# Patient Record
Sex: Male | Born: 1980 | Race: White | Hispanic: No | Marital: Single | Smoking: Never smoker
Health system: Southern US, Community
[De-identification: ages and names within clinical notes are randomized; demographics above are authoritative.]

## PROBLEM LIST (undated history)

## (undated) DIAGNOSIS — M51369 Other intervertebral disc degeneration, lumbar region without mention of lumbar back pain or lower extremity pain: Secondary | ICD-10-CM

## (undated) DIAGNOSIS — E119 Type 2 diabetes mellitus without complications: Secondary | ICD-10-CM

## (undated) DIAGNOSIS — K219 Gastro-esophageal reflux disease without esophagitis: Secondary | ICD-10-CM

## (undated) DIAGNOSIS — M109 Gout, unspecified: Secondary | ICD-10-CM

## (undated) DIAGNOSIS — I10 Essential (primary) hypertension: Secondary | ICD-10-CM

## (undated) DIAGNOSIS — G4733 Obstructive sleep apnea (adult) (pediatric): Secondary | ICD-10-CM

## (undated) HISTORY — DX: Obstructive sleep apnea (adult) (pediatric): G47.33

## (undated) HISTORY — DX: Type 2 diabetes mellitus without complications: E11.9

---

## 1999-01-15 HISTORY — PX: TOE AMPUTATION: SHX809

## 1999-05-03 ENCOUNTER — Inpatient Hospital Stay (HOSPITAL_COMMUNITY): Admission: EM | Admit: 1999-05-03 | Discharge: 1999-05-05 | Payer: Self-pay | Admitting: Emergency Medicine

## 1999-05-03 ENCOUNTER — Encounter: Payer: Self-pay | Admitting: Emergency Medicine

## 2000-03-30 ENCOUNTER — Emergency Department (HOSPITAL_COMMUNITY): Admission: EM | Admit: 2000-03-30 | Discharge: 2000-03-30 | Payer: Self-pay | Admitting: *Deleted

## 2002-03-25 ENCOUNTER — Emergency Department (HOSPITAL_COMMUNITY): Admission: EM | Admit: 2002-03-25 | Discharge: 2002-03-26 | Payer: Self-pay | Admitting: Emergency Medicine

## 2002-03-25 ENCOUNTER — Encounter: Payer: Self-pay | Admitting: Emergency Medicine

## 2002-08-06 ENCOUNTER — Emergency Department (HOSPITAL_COMMUNITY): Admission: EM | Admit: 2002-08-06 | Discharge: 2002-08-07 | Payer: Self-pay | Admitting: Emergency Medicine

## 2002-10-19 ENCOUNTER — Emergency Department (HOSPITAL_COMMUNITY): Admission: EM | Admit: 2002-10-19 | Discharge: 2002-10-19 | Payer: Self-pay | Admitting: Emergency Medicine

## 2003-03-10 ENCOUNTER — Emergency Department (HOSPITAL_COMMUNITY): Admission: EM | Admit: 2003-03-10 | Discharge: 2003-03-10 | Payer: Self-pay | Admitting: Family Medicine

## 2003-03-21 ENCOUNTER — Ambulatory Visit (HOSPITAL_BASED_OUTPATIENT_CLINIC_OR_DEPARTMENT_OTHER): Admission: RE | Admit: 2003-03-21 | Discharge: 2003-03-21 | Payer: Self-pay | Admitting: Pulmonary Disease

## 2003-08-08 ENCOUNTER — Emergency Department (HOSPITAL_COMMUNITY): Admission: EM | Admit: 2003-08-08 | Discharge: 2003-08-08 | Payer: Self-pay | Admitting: Emergency Medicine

## 2003-11-06 ENCOUNTER — Emergency Department (HOSPITAL_COMMUNITY): Admission: EM | Admit: 2003-11-06 | Discharge: 2003-11-06 | Payer: Self-pay | Admitting: Family Medicine

## 2004-01-02 ENCOUNTER — Ambulatory Visit: Payer: Self-pay | Admitting: Family Medicine

## 2004-03-29 ENCOUNTER — Ambulatory Visit: Payer: Self-pay | Admitting: Family Medicine

## 2004-04-05 ENCOUNTER — Ambulatory Visit: Payer: Self-pay | Admitting: Family Medicine

## 2004-05-30 ENCOUNTER — Ambulatory Visit: Payer: Self-pay | Admitting: Family Medicine

## 2004-06-12 ENCOUNTER — Ambulatory Visit: Payer: Self-pay | Admitting: Pulmonary Disease

## 2004-12-13 ENCOUNTER — Ambulatory Visit: Payer: Self-pay | Admitting: Family Medicine

## 2004-12-18 ENCOUNTER — Ambulatory Visit: Payer: Self-pay | Admitting: Cardiology

## 2005-01-12 ENCOUNTER — Emergency Department (HOSPITAL_COMMUNITY): Admission: EM | Admit: 2005-01-12 | Discharge: 2005-01-12 | Payer: Self-pay | Admitting: Emergency Medicine

## 2005-02-01 ENCOUNTER — Ambulatory Visit: Payer: Self-pay | Admitting: Family Medicine

## 2005-02-12 ENCOUNTER — Emergency Department (HOSPITAL_COMMUNITY): Admission: EM | Admit: 2005-02-12 | Discharge: 2005-02-12 | Payer: Self-pay | Admitting: Emergency Medicine

## 2005-03-11 ENCOUNTER — Emergency Department (HOSPITAL_COMMUNITY): Admission: EM | Admit: 2005-03-11 | Discharge: 2005-03-11 | Payer: Self-pay | Admitting: Emergency Medicine

## 2005-04-04 ENCOUNTER — Emergency Department (HOSPITAL_COMMUNITY): Admission: EM | Admit: 2005-04-04 | Discharge: 2005-04-04 | Payer: Self-pay | Admitting: Emergency Medicine

## 2005-05-15 ENCOUNTER — Ambulatory Visit: Payer: Self-pay | Admitting: Family Medicine

## 2005-05-17 ENCOUNTER — Emergency Department (HOSPITAL_COMMUNITY): Admission: EM | Admit: 2005-05-17 | Discharge: 2005-05-17 | Payer: Self-pay | Admitting: Emergency Medicine

## 2005-06-04 ENCOUNTER — Ambulatory Visit: Payer: Self-pay | Admitting: Family Medicine

## 2005-06-14 ENCOUNTER — Encounter: Admission: RE | Admit: 2005-06-14 | Discharge: 2005-06-14 | Payer: Self-pay | Admitting: Family Medicine

## 2005-10-03 ENCOUNTER — Emergency Department: Payer: Self-pay | Admitting: Internal Medicine

## 2005-10-16 ENCOUNTER — Emergency Department: Payer: Self-pay | Admitting: Internal Medicine

## 2006-02-27 ENCOUNTER — Ambulatory Visit: Payer: Self-pay | Admitting: Family Medicine

## 2006-02-27 LAB — CONVERTED CEMR LAB
ALT: 56 units/L — ABNORMAL HIGH (ref 0–40)
AST: 40 units/L — ABNORMAL HIGH (ref 0–37)
Albumin: 4.1 g/dL (ref 3.5–5.2)
Alkaline Phosphatase: 75 units/L (ref 39–117)
BUN: 10 mg/dL (ref 6–23)
Basophils Absolute: 0 10*3/uL (ref 0.0–0.1)
Basophils Relative: 0.3 % (ref 0.0–1.0)
Bilirubin, Direct: 0.1 mg/dL (ref 0.0–0.3)
CO2: 26 meq/L (ref 19–32)
Calcium: 9.2 mg/dL (ref 8.4–10.5)
Chloride: 102 meq/L (ref 96–112)
Cholesterol: 122 mg/dL (ref 0–200)
Creatinine, Ser: 0.8 mg/dL (ref 0.4–1.5)
Eosinophils Absolute: 0.1 10*3/uL (ref 0.0–0.6)
Eosinophils Relative: 2 % (ref 0.0–5.0)
GFR calc Af Amer: 150 mL/min
GFR calc non Af Amer: 124 mL/min
Glucose, Bld: 90 mg/dL (ref 70–99)
HCT: 47 % (ref 39.0–52.0)
HDL: 27 mg/dL — ABNORMAL LOW (ref >39.0)
Hemoglobin: 16.3 g/dL (ref 13.0–17.0)
Hgb A1c MFr Bld: 5.2 % (ref 4.6–6.0)
LDL Cholesterol: 69 mg/dL (ref 0–99)
Lymphocytes Relative: 25.4 % (ref 12.0–46.0)
MCHC: 34.7 g/dL (ref 30.0–36.0)
MCV: 84.3 fL (ref 78.0–100.0)
Monocytes Absolute: 0.5 10*3/uL (ref 0.2–0.7)
Monocytes Relative: 7.3 % (ref 3.0–11.0)
Neutro Abs: 5 10*3/uL (ref 1.4–7.7)
Neutrophils Relative %: 65 % (ref 43.0–77.0)
Platelets: 228 10*3/uL (ref 150–400)
Potassium: 3.8 meq/L (ref 3.5–5.1)
RBC: 5.58 M/uL (ref 4.22–5.81)
RDW: 13.7 % (ref 11.5–14.6)
Sodium: 136 meq/L (ref 135–145)
TSH: 3.29 microintl units/mL (ref 0.35–5.50)
Total Bilirubin: 1.1 mg/dL (ref 0.3–1.2)
Total CHOL/HDL Ratio: 4.5
Total Protein: 7.1 g/dL (ref 6.0–8.3)
Triglycerides: 130 mg/dL (ref 0–149)
VLDL: 26 mg/dL (ref 0–40)
WBC: 7.5 10*3/uL (ref 4.5–10.5)

## 2006-03-20 ENCOUNTER — Ambulatory Visit: Payer: Self-pay | Admitting: Family Medicine

## 2006-04-18 ENCOUNTER — Ambulatory Visit: Payer: Self-pay | Admitting: Family Medicine

## 2006-08-27 DIAGNOSIS — K219 Gastro-esophageal reflux disease without esophagitis: Secondary | ICD-10-CM | POA: Insufficient documentation

## 2006-08-27 DIAGNOSIS — J45909 Unspecified asthma, uncomplicated: Secondary | ICD-10-CM | POA: Insufficient documentation

## 2006-08-31 ENCOUNTER — Emergency Department: Payer: Self-pay | Admitting: Emergency Medicine

## 2006-10-02 ENCOUNTER — Telehealth: Payer: Self-pay | Admitting: Family Medicine

## 2006-10-07 ENCOUNTER — Ambulatory Visit: Payer: Self-pay | Admitting: Family Medicine

## 2006-10-07 DIAGNOSIS — I10 Essential (primary) hypertension: Secondary | ICD-10-CM | POA: Insufficient documentation

## 2007-01-15 HISTORY — PX: NOSE SURGERY: SHX723

## 2007-04-20 ENCOUNTER — Ambulatory Visit: Payer: Self-pay | Admitting: Internal Medicine

## 2007-04-24 ENCOUNTER — Emergency Department: Payer: Self-pay | Admitting: Emergency Medicine

## 2007-06-26 ENCOUNTER — Ambulatory Visit: Payer: Self-pay | Admitting: Family Medicine

## 2007-06-29 LAB — CONVERTED CEMR LAB
ALT: 39 units/L (ref 0–53)
AST: 24 units/L (ref 0–37)
Albumin: 4 g/dL (ref 3.5–5.2)
Alkaline Phosphatase: 85 units/L (ref 39–117)
BUN: 13 mg/dL (ref 6–23)
Basophils Absolute: 0 10*3/uL (ref 0.0–0.1)
Basophils Relative: 0.2 % (ref 0.0–1.0)
Bilirubin, Direct: 0.1 mg/dL (ref 0.0–0.3)
CO2: 29 meq/L (ref 19–32)
Calcium: 9 mg/dL (ref 8.4–10.5)
Chloride: 102 meq/L (ref 96–112)
Cholesterol: 120 mg/dL (ref 0–200)
Creatinine, Ser: 0.9 mg/dL (ref 0.4–1.5)
Eosinophils Absolute: 0.1 10*3/uL (ref 0.0–0.7)
Eosinophils Relative: 1.4 % (ref 0.0–5.0)
GFR calc Af Amer: 130 mL/min
GFR calc non Af Amer: 108 mL/min
Glucose, Bld: 98 mg/dL (ref 70–99)
HCT: 41.3 % (ref 39.0–52.0)
HDL: 23.5 mg/dL — ABNORMAL LOW (ref >39.0)
Hemoglobin: 14.2 g/dL (ref 13.0–17.0)
LDL Cholesterol: 81 mg/dL (ref 0–99)
Lymphocytes Relative: 24.5 % (ref 12.0–46.0)
MCHC: 34.3 g/dL (ref 30.0–36.0)
MCV: 84.1 fL (ref 78.0–100.0)
Monocytes Absolute: 0.5 10*3/uL (ref 0.1–1.0)
Monocytes Relative: 8.5 % (ref 3.0–12.0)
Neutro Abs: 3.9 10*3/uL (ref 1.4–7.7)
Neutrophils Relative %: 65.4 % (ref 43.0–77.0)
Platelets: 200 10*3/uL (ref 150–400)
Potassium: 4.7 meq/L (ref 3.5–5.1)
RBC: 4.91 M/uL (ref 4.22–5.81)
RDW: 13.7 % (ref 11.5–14.6)
Sodium: 137 meq/L (ref 135–145)
TSH: 2.45 microintl units/mL (ref 0.35–5.50)
Total Bilirubin: 1 mg/dL (ref 0.3–1.2)
Total CHOL/HDL Ratio: 5.1
Total Protein: 7.4 g/dL (ref 6.0–8.3)
Triglycerides: 79 mg/dL (ref 0–149)
VLDL: 16 mg/dL (ref 0–40)
WBC: 6 10*3/uL (ref 4.5–10.5)

## 2007-07-01 ENCOUNTER — Ambulatory Visit: Payer: Self-pay | Admitting: Family Medicine

## 2007-07-01 DIAGNOSIS — G5600 Carpal tunnel syndrome, unspecified upper limb: Secondary | ICD-10-CM

## 2007-08-03 ENCOUNTER — Telehealth: Payer: Self-pay | Admitting: Family Medicine

## 2007-12-17 ENCOUNTER — Ambulatory Visit: Payer: Self-pay | Admitting: Family Medicine

## 2007-12-17 DIAGNOSIS — G4730 Sleep apnea, unspecified: Secondary | ICD-10-CM

## 2008-01-06 ENCOUNTER — Encounter: Payer: Self-pay | Admitting: Family Medicine

## 2008-02-18 ENCOUNTER — Ambulatory Visit (HOSPITAL_COMMUNITY): Admission: RE | Admit: 2008-02-18 | Discharge: 2008-02-19 | Payer: Self-pay | Admitting: Otolaryngology

## 2008-05-18 ENCOUNTER — Ambulatory Visit: Payer: Self-pay | Admitting: Internal Medicine

## 2008-05-22 ENCOUNTER — Ambulatory Visit (HOSPITAL_BASED_OUTPATIENT_CLINIC_OR_DEPARTMENT_OTHER): Admission: RE | Admit: 2008-05-22 | Discharge: 2008-05-22 | Payer: Self-pay | Admitting: Otolaryngology

## 2009-03-28 ENCOUNTER — Ambulatory Visit: Payer: Self-pay | Admitting: Family Medicine

## 2009-04-08 ENCOUNTER — Emergency Department (HOSPITAL_COMMUNITY): Admission: EM | Admit: 2009-04-08 | Discharge: 2009-04-08 | Payer: Self-pay | Admitting: Family Medicine

## 2010-05-01 LAB — CBC
HCT: 44 % (ref 39.0–52.0)
MCV: 84.3 fL (ref 78.0–100.0)
RBC: 5.22 MIL/uL (ref 4.22–5.81)
WBC: 8 10*3/uL (ref 4.0–10.5)

## 2010-05-01 LAB — BASIC METABOLIC PANEL
Chloride: 104 mEq/L (ref 96–112)
GFR calc Af Amer: >60 mL/min (ref >60)
GFR calc non Af Amer: >60 mL/min (ref >60)
Potassium: 4.3 mEq/L (ref 3.5–5.1)

## 2010-05-29 NOTE — Op Note (Signed)
NAMESEBASTIAN, Danny Bauer               ACCOUNT NO.:  [id number]   MEDICAL RECORD NO.:  [id number]          PATIENT TYPE:  AMB   LOCATION:  SDS                          FACILITY:  [facility]   PHYSICIAN:  Suzanna Obey, M.D.       DATE OF BIRTH:  02-06-1980   DATE OF PROCEDURE:  DATE OF DISCHARGE:                               OPERATIVE REPORT   PREOPERATIVE DIAGNOSES:  Deviated septum and turbinate hypertrophy.   POSTOPERATIVE DIAGNOSES:  Deviated septum and turbinate hypertrophy.   SURGICAL PROCEDURE:  Septoplasty and submucous resection of the inferior  turbinates.   ANESTHESIA:  General.   ESTIMATED BLOOD LOSS:  Approximately 10 mL.   INDICATIONS:  This is a 29 year old who has had chronic nasal  obstruction and congestion and has been refractory to medical therapy.  He has obstructive sleep apnea so this will help his condition, but he  certainly understands this will not in any way cure the condition.  He  was informed of the risks and benefits of the procedure and options were  discussed.  All questions were answered and consent was obtained.   OPERATION:  The patient was taken to the operating room and placed in  supine position.  After general endotracheal tube anesthesia was placed  in the supine position, prepped and draped in the usual sterile manner.  The oxymetazoline pledgets were placed in the septum and inferior  turbinates were injected with 1% lidocaine with 1:100,000 epinephrine.  A left hemitransfixion incision was performed, raising the  mucoperichondrial and osteal flap.  The  caudal edge was divided about 2  cm posterior of the caudal strut and this deviated portion of the septum  was removed posteriorly.  The opposite flap was elevated and the [facility] forceps were used to remove the deviated bone and then a 4-mm  osteotome used to free up the septal spur that was removed.  This  corrected the septal deflection.  Turbinates were infractured.  Midline  incision made with a 15-blade.  Mucosal flap elevated superiorly and the  inferior mucosa and bone were removed with the turbinate scissors.  The  edge was cauterized with suction cautery and both flaps were laid back  down over the lower surface and turbinates were outfractured.  Septum  closed with interrupted 4-0 chromic and 4-0 plain gut quilting stitch  placed through the septum.  This nasopharynx was suctioned out of all  blood and debris, and there was good hemostasis.  Therefore no packing  was placed in either side of the nose.  The patient was awakened and  brought to the recovery room in stable condition.  Counts correct.           ______________________________  Suzanna Obey, M.D.     JB/MEDQ  D:  02/18/2008  T:  02/18/2008  Job:  [id number]

## 2010-06-01 NOTE — Assessment & Plan Note (Signed)
[facility] OFFICE NOTE   Danny Bauer, Danny Bauer                      MRN:          [id number]  DATE:02/27/2006                            DOB:          June 25, 1980    This is a 30 year old gentleman here for a complete physical  examination.  He is doing well and has no complaints.  Unfortunately he  continues to eat whatever he wants and gets almost no exercise.  We have  been following him for acid reflux disease and asthma, both of which  have been very stable.  He continues to use his CPAP machine every night  successfully for sleep apnea.  He has had borderline elevated blood  pressures over the past several years which we have been following  closely.  For further details of his past medical history, family  history, social history, habits, etc., I refer to our last physical note  dated April 05, 2004.   ALLERGIES:  VICODIN, PREDNISONE, AND BEE STINGS.   CURRENT MEDICATIONS:  1. Albuterol inhaler as needed.  2. Protonix 40 mg daily.  3. An EpiPen as needed.  4. Dicyclomine 20 mg t.i.d. as needed (which he has not used in quite      a while).   OBJECTIVE:  Height 6 foot 0 inches, weight 334 pounds (which is up 10  pounds from a year ago), pulse 84 and regular, blood pressure 158/104.  GENERAL:  He remains morbidly obese.  SKIN:  Clear.  EYES:  Clear, he wears glasses.  EARS:  Clear.  PHARYNX:  Clear.  NECK:  Supple without lymphadenopathy or masses.  LUNGS:  Clear with good airflow.  CARDIAC:  Rate and rhythm regular without gallops, murmurs, or rubs.  Distal pulses are full.  ABDOMEN:  Soft, normal bowel sounds, nontender, no masses.  GENITALIA:  Normal male, he is circumcised.  EXTREMITIES:  No clubbing, cyanosis, edema.  NEUROLOGIC:  Grossly intact.   ASSESSMENT/PLAN:  1. Complete physical.  He is fasting so we will get the usual      laboratories today.  We talked about exercising and losing  weight.  2. Asthma.  Stable.  3. Sleep apnea.  Stable.  4. Gastroesophageal reflux disease.  Stable.  5. New diagnosis of hypertension.  In addition to losing weight I gave      him samples to begin Diovan 160 mg to take once daily.  I asked to      see him back in 3 weeks for a followup visit.     Tera Mater. Clent Ridges, MD  Electronically Signed    SAF/MedQ  DD: 02/27/2006  DT: 02/27/2006  Job #: [id number]

## 2010-06-01 NOTE — Op Note (Signed)
[place]. [facility]  Patient:    ESPIRIDION, Danny Bauer                      MRN: [id number] Proc. Date: 05/03/99 Adm. Date:  [id number] Attending:  Aldean Baker V                           Operative Report  PREOPERATIVE DIAGNOSIS: 1. 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 2. Partial amputation of the right second toe.  POSTOPERATIVE DIAGNOSIS: 1. 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 2. Partial amputation of the right second toe.  OPERATION PERFORMED:  Irrigation and debridement of right foot with completion of amputation of right great toe at the interphalangeal joint and closure of traumatic wound of the right second toe.  SURGEON:  Nadara Mustard, M.D.  ANESTHESIA:  Ankle block.  ESTIMATED BLOOD LOSS:  Minimal.  ANTIBIOTICS:  1 gm of Kefzol preoperatively.  DRAINS:  None.  COMPLICATIONS:  None.  TOURNIQUET TIME:  None.  DISPOSITION:  To PACU in stable condition.  INDICATIONS FOR PROCEDURE:  The patient is a 30 year old gentleman status post a lawnmower injury to his right foot.  He received Kefzol and a DT booster shot in the emergency room and was brought emergently to the operating room for definitive treatment.  Risks and benefits were discussed and the patient and his mother state they wish to proceed.  DESCRIPTION OF PROCEDURE:  The patient was brought to operating room 14 after undergoing ankle block.  After adequate level of anesthesia was obtained, the patients right lower extremity was prepped using Betadine paint and scrub and draped into a sterile field.  The amputated sites were irrigated with pulse lavage.  There were no foreign bodies, no particulate matter.  The wounds were extremely clean.  There was a small bony fragment of the distal phalanx of the great toe and this was removed.  The ____________ was intact of the proximal phalanx at the IP joint.  The skin was debrided back to  bleeding healthy edges and the wound was closed over the IP joint of the great toe using far near near far stitch with 3-0 nylon.  The second toe after debridement the wound was closed using interrupted 3-0 nylon.  Both toes were neurovascularly intact.  The wounds were cleansed, covered with Adaptic, orthopedic sponges, sterile Webril and a loosely wrapped Coban.  The patient was then taken to PACU in stable condition.  Plan for 48 hours IV antibiotics. DD:  05/03/99 TD:  05/04/99 Job: [id number]

## 2010-06-01 NOTE — Procedures (Signed)
NAME:  Danny, Bauer NO.:  [id number]   MEDICAL RECORD NO.:  [id number]          PATIENT TYPE:  OUT   LOCATION:  SLEEP CENTER                 FACILITY:  [facility] [place]   PHYSICIAN:  Clinton D. Maple Hudson, MD, FCCP, FACPDATE OF BIRTH:  1979-08-14   DATE OF STUDY:                            NOCTURNAL POLYSOMNOGRAM   REFERRING PHYSICIAN:  Suzanna Obey, M.D.   INDICATION FOR STUDY:  Hypersomnia with sleep apnea.   EPWORTH SLEEPINESS SCORE:  2/24.  BMI 42.2.  Weight 329 pounds.  Height  74 inches.  Neck 21 inches.   MEDICATIONS:  Home medication charted and reviewed.   SLEEP ARCHITECTURE:  Split study protocol.  During the diagnostic phase  total sleep time 131 minutes with sleep efficiency 96.3%.  Stage I was  0.4%.  Stage II 99.6%.  Stage III and REM were absent.  Sleep latency 5  minutes.  Awake after sleep onset 0.  Arousal index 54.8 indicating  increased EEG arousal.  No bedtime medication was taken.   RESPIRATORY DATA:  Split study protocol.  Apnea/hypopnea index (AHI)  107.2 per hour.  A total of 235 events was scored including 125  obstructive apneas and 110 hypopneas.  Events were common while supine  and nonsupine.  CPAP was then titrated to 12 CWP, AHI 1.3 per hour.  He  wore a medium ResMed Mirage Quattro mask with heated humidifier.   OXYGEN DATA:  Loud snoring before CPAP with oxygen desaturation to a  nadir of 77%.  After CPAP control, mean oxygen saturation held 96.6% on  room air.   CARDIAC DATA:  Sinus rhythm with occasional PVCs.   MOVEMENT-PARASOMNIA:  No significant movement disturbance.  No bathroom  trips.   IMPRESSIONS-RECOMMENDATIONS:  1. Very severe obstructive sleep apnea/hypopnea syndrome, AHI 107.2      per hour with loud snoring and oxygen desaturation to a nadir of      77% on room air.  2. Successful CPAP titration to 12 CWP, AHI 1.3 per hour.  He wore a      medium ResMed Mirage Quattro mask with heated humidifier.      Clinton D.  Maple Hudson, MD, [facility], FACP  Diplomate, Biomedical engineer of Sleep Medicine  Electronically Signed     CDY/MEDQ  D:  05/23/2008 19:08:42  T:  05/24/2008 08:00:11  Job:  [id number]

## 2010-06-01 NOTE — H&P (Signed)
[place] [facility]. [facility]  Patient:    Danny Bauer, Danny Bauer                      MRN: [id number] Adm. Date:  [id number] Attending:  Nadara Mustard                         History and Physical  HISTORY OF PRESENT ILLNESS:  The patient is a 30 year old gentleman who was mowing the lawn this evening and had the lawnmower run over his right foot.  Injury happened approximately 5:15 p.m.  He was seen in the emergency room at 6 p.m. is toe and sneaker were left at home.  ALLERGIES:  No known drug allergies.  MEDICATIONS:  None.  PAST MEDICAL HISTORY:  Negative.  FAMILY HISTORY:  Negative.  REVIEW OF SYSTEMS:  Negative.  PHYSICAL EXAMINATION:  GENERAL:  He is in no acute distress.  He is an extremely obese 30 year old gentleman.  NECK:  Supple.  No bruits.  LUNGS:  Clear to auscultation.  CARDIOVASCULAR:  Regular rate and rhythm.  RIGHT LOWER EXTREMITY:  He has a good dorsalis pedis pulse.  He has an amputation through the IP joint of the right great toe with a partial amputation of the second toe.  Radiographs confirm the diagnosis.  There is no bony involvement of the right second toe.  ASSESSMENT: 1. Amputation of right great toe through the interphalangeal joint. 2. Partial amputation of the right second toe.  PLAN:  He was scheduled for surgical intervention with completion of the amputation, irrigation and debridement and revision of the amputations.  Risks nd benefits were discussed with the patient and his mother.  They both state they understand and wish to proceed at this time. Risk include infection, neurovascular injury, persistent pain, nonhealing of the wound, need for further amputation. The patient and his mother wished to proceed at this time. DD:  05/03/99 TD:  05/03/99 Job: [id number]

## 2010-09-23 ENCOUNTER — Emergency Department (HOSPITAL_COMMUNITY): Payer: Self-pay

## 2010-09-23 ENCOUNTER — Emergency Department (HOSPITAL_COMMUNITY)
Admission: EM | Admit: 2010-09-23 | Discharge: 2010-09-23 | Disposition: A | Discharge status: Home / Self Care | Payer: Self-pay | Attending: Emergency Medicine | Admitting: Emergency Medicine

## 2010-09-23 DIAGNOSIS — R0789 Other chest pain: Secondary | ICD-10-CM | POA: Insufficient documentation

## 2010-09-23 DIAGNOSIS — K219 Gastro-esophageal reflux disease without esophagitis: Secondary | ICD-10-CM | POA: Insufficient documentation

## 2010-09-23 DIAGNOSIS — K589 Irritable bowel syndrome without diarrhea: Secondary | ICD-10-CM | POA: Insufficient documentation

## 2010-09-23 DIAGNOSIS — J45909 Unspecified asthma, uncomplicated: Secondary | ICD-10-CM | POA: Insufficient documentation

## 2010-09-23 DIAGNOSIS — I10 Essential (primary) hypertension: Secondary | ICD-10-CM | POA: Insufficient documentation

## 2010-09-23 LAB — COMPREHENSIVE METABOLIC PANEL
ALT: 48 U/L (ref 0–53)
AST: 30 U/L (ref 0–37)
CO2: 26 mEq/L (ref 19–32)
Calcium: 8.8 mg/dL (ref 8.4–10.5)
Chloride: 103 mEq/L (ref 96–112)
GFR calc Af Amer: >60 mL/min (ref >60)
GFR calc non Af Amer: >60 mL/min (ref >60)
Glucose, Bld: 105 mg/dL — ABNORMAL HIGH (ref 70–99)
Sodium: 137 mEq/L (ref 135–145)
Total Bilirubin: 0.4 mg/dL (ref 0.3–1.2)

## 2010-09-23 LAB — CBC
Hemoglobin: 14.6 g/dL (ref 13.0–17.0)
MCH: 29.3 pg (ref 26.0–34.0)
MCV: 82.6 fL (ref 78.0–100.0)
Platelets: 209 10*3/uL (ref 150–400)
RBC: 4.99 MIL/uL (ref 4.22–5.81)
WBC: 7 10*3/uL (ref 4.0–10.5)

## 2010-09-23 LAB — POCT I-STAT TROPONIN I: Troponin i, poc: 0.01 ng/mL (ref 0.00–0.08)

## 2010-11-29 ENCOUNTER — Emergency Department (INDEPENDENT_AMBULATORY_CARE_PROVIDER_SITE_OTHER)
Admission: EM | Admit: 2010-11-29 | Discharge: 2010-11-29 | Disposition: A | Discharge status: Home / Self Care | Payer: Self-pay | Source: Home / Self Care | Attending: Family Medicine | Admitting: Family Medicine

## 2010-11-29 ENCOUNTER — Encounter: Payer: Self-pay | Admitting: Cardiology

## 2010-11-29 DIAGNOSIS — R05 Cough: Secondary | ICD-10-CM

## 2010-11-29 HISTORY — DX: Essential (primary) hypertension: I10

## 2010-11-29 HISTORY — DX: Gastro-esophageal reflux disease without esophagitis: K21.9

## 2010-11-29 MED ORDER — GUAIFENESIN-CODEINE 100-10 MG/5ML PO SYRP: 5.0000 mL | ORAL_SOLUTION | Freq: Three times a day (TID) | ORAL | Status: AC | PRN

## 2010-11-29 MED ORDER — PREDNISONE (PAK) 10 MG PO TABS: ORAL_TABLET | ORAL | Status: AC

## 2010-11-29 MED ORDER — BENZONATATE 200 MG PO CAPS: 200.0000 mg | ORAL_CAPSULE | Freq: Three times a day (TID) | ORAL | Status: AC | PRN

## 2010-11-29 NOTE — ED Provider Notes (Signed)
History     CSN: [id number] Arrival date & time: 11/29/2010  8:25 AM   First MD Initiated Contact with Patient 11/29/10 [phone]      Chief Complaint  Patient presents with  . Cough    (Consider location/radiation/quality/duration/timing/severity/associated sxs/prior treatment) Patient is a 30 y.o. male presenting with cough. The history is provided by the patient.  Cough This is a new problem. The current episode started more than 2 days ago. The problem occurs constantly. The problem has been gradually worsening. The cough is non-productive. There has been no fever. Associated symptoms include sore throat. Pertinent negatives include no chills, no ear pain, no rhinorrhea, no myalgias and no shortness of breath. He has tried cough syrup for the symptoms. The treatment provided no relief.    Past Medical History  Diagnosis Date  . Hypertension   . GERD (gastroesophageal reflux disease)     Past Surgical History  Procedure Date  . 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 Nose surgery 2009    straighten nasal passage     Family History  Problem Relation Age of Onset  . Heart failure Mother   . Heart disease Mother   . Heart attack Father   . Heart disease Father     History  Substance Use Topics  . Smoking status: Never Smoker   . Smokeless tobacco: Not on file  . Alcohol Use: No      Review of Systems  Constitutional: Positive for fatigue. Negative for chills.  HENT: Positive for sore throat. Negative for ear pain and rhinorrhea.   Eyes: Negative.   Respiratory: Positive for cough. Negative for shortness of breath.   Gastrointestinal: Negative.   Genitourinary: Negative.   Musculoskeletal: Negative for myalgias.  Neurological: Negative.     Allergies  Beeswax; Codeine; Hydrocodone-acetaminophen; and Prednisone  Home Medications  No current outpatient prescriptions on file.  BP 143/88  Pulse 86  Temp(Src) 99.2 F (37.3 C) (Oral)  Resp 22  SpO2  95%  Physical Exam  Constitutional: He is oriented to person, place, and time. He appears well-developed and well-nourished.  HENT:  Head: Normocephalic and atraumatic.  Right Ear: Tympanic membrane normal.  Left Ear: Tympanic membrane normal.  Mouth/Throat: Oropharynx is clear and moist and mucous membranes are normal.  Eyes: EOM are normal.  Neck: Normal range of motion.  Pulmonary/Chest: Effort normal and breath sounds normal. He has no wheezes. He has no rhonchi. He has no rales.  Neurological: He is alert and oriented to person, place, and time.  Skin: Skin is warm and dry.    ED Course  Procedures (including critical care time)  Labs Reviewed - No data to display No results found.   No diagnosis found.    MDM          Richardo Priest, MD 11/29/10 [phone]

## 2010-11-29 NOTE — ED Notes (Signed)
Pt reports non-productive cough and chest congestion for the past 3 days. Also notes dry, itchy throat.  Taking tussin OTC with no relief. BS CTA bilat.

## 2011-01-18 ENCOUNTER — Ambulatory Visit (INDEPENDENT_AMBULATORY_CARE_PROVIDER_SITE_OTHER): Payer: Self-pay | Admitting: Family Medicine

## 2011-01-18 ENCOUNTER — Encounter: Payer: Self-pay | Admitting: Family Medicine

## 2011-01-18 VITALS — BP 130/92 | HR 95 | Temp 98.7°F | Wt 370.0 lb

## 2011-01-18 DIAGNOSIS — J40 Bronchitis, not specified as acute or chronic: Secondary | ICD-10-CM

## 2011-01-18 DIAGNOSIS — I10 Essential (primary) hypertension: Secondary | ICD-10-CM

## 2011-01-18 MED ORDER — AZITHROMYCIN 250 MG PO TABS: ORAL_TABLET | ORAL | Status: AC

## 2011-01-18 MED ORDER — LISINOPRIL 20 MG PO TABS: 20.0000 mg | ORAL_TABLET | Freq: Every day | ORAL | Status: DC

## 2011-01-18 MED ORDER — DILTIAZEM HCL 90 MG PO TABS: 90.0000 mg | ORAL_TABLET | Freq: Four times a day (QID) | ORAL | Status: DC

## 2011-01-18 NOTE — Progress Notes (Signed)
  Subjective:    Patient ID: Danny Bauer, male    DOB: 10-Mar-1980, 31 y.o.   MRN: [id number]  HPI Here for elevated BP and coughing for 5 days. The cough is dry but he feels tight and congested. No fever. I have not seen him in 3 years. He ran out of BP meds over a year ago.    Review of Systems  Constitutional: Negative.   HENT: Negative.   Eyes: Negative.   Respiratory: Positive for cough and chest tightness.   Cardiovascular: Negative.        Objective:   Physical Exam  Constitutional: He appears well-developed and well-nourished.  HENT:  Right Ear: External ear normal.  Left Ear: External ear normal.  Nose: Nose normal.  Mouth/Throat: Oropharynx is clear and moist. No oropharyngeal exudate.  Eyes: Conjunctivae are normal.  Cardiovascular: Normal rate, regular rhythm, normal heart sounds and intact distal pulses.   Pulmonary/Chest: Breath sounds normal. No respiratory distress. He has no wheezes. He has no rales.  Lymphadenopathy:    He has no cervical adenopathy.          Assessment & Plan:  Treat the bronchitis. Add Mucinex prn. Get back on his HTN meds. Recheck in one month

## 2011-02-18 ENCOUNTER — Encounter: Payer: Self-pay | Admitting: Family Medicine

## 2011-02-18 ENCOUNTER — Ambulatory Visit (INDEPENDENT_AMBULATORY_CARE_PROVIDER_SITE_OTHER): Payer: Self-pay | Admitting: Family Medicine

## 2011-02-18 VITALS — BP 128/80 | HR 79 | Temp 97.9°F

## 2011-02-18 DIAGNOSIS — I10 Essential (primary) hypertension: Secondary | ICD-10-CM

## 2011-02-18 MED ORDER — DILTIAZEM HCL 90 MG PO TABS: 90.0000 mg | ORAL_TABLET | Freq: Every day | ORAL | Status: DC

## 2011-02-18 NOTE — Progress Notes (Signed)
  Subjective:    Patient ID: Danny Bauer, male    DOB: 12/23/1980, 31 y.o.   MRN: [id number]  HPI Here to follow up on HTN after taking his meds for one month. He feels great and his BP is well controlled.    Review of Systems  Constitutional: Negative.   Respiratory: Negative.   Cardiovascular: Negative.        Objective:   Physical Exam  Constitutional: He appears well-developed and well-nourished.  Cardiovascular: Normal rate, regular rhythm, normal heart sounds and intact distal pulses.   Pulmonary/Chest: Effort normal and breath sounds normal. No respiratory distress. He has no wheezes. He has no rales. He exhibits no tenderness.          Assessment & Plan:  Recheck in 6 months

## 2011-04-09 ENCOUNTER — Ambulatory Visit (INDEPENDENT_AMBULATORY_CARE_PROVIDER_SITE_OTHER): Payer: Self-pay | Admitting: Family Medicine

## 2011-04-09 ENCOUNTER — Encounter: Payer: Self-pay | Admitting: Family Medicine

## 2011-04-09 VITALS — BP 124/88 | HR 120 | Temp 100.8°F

## 2011-04-09 DIAGNOSIS — J329 Chronic sinusitis, unspecified: Secondary | ICD-10-CM

## 2011-04-09 MED ORDER — DOXYCYCLINE HYCLATE 100 MG PO CAPS: 100.0000 mg | ORAL_CAPSULE | Freq: Two times a day (BID) | ORAL | Status: AC

## 2011-04-09 MED ORDER — EPINEPHRINE 0.3 MG/0.3ML IJ DEVI: 0.3000 mg | Freq: Once | INTRAMUSCULAR | Status: DC

## 2011-04-09 MED ORDER — PROMETHAZINE HCL 25 MG PO TABS: 25.0000 mg | ORAL_TABLET | Freq: Four times a day (QID) | ORAL | Status: AC | PRN

## 2011-04-09 NOTE — Progress Notes (Signed)
  Subjective:    Patient ID: Danny Bauer, male    DOB: [birth year], 31 y.o.   MRN: [id number]  HPI Here for 10 days of sinus pressure, PND, HA, and a dry cough. No fever.    Review of Systems  Constitutional: Negative.   HENT: Positive for congestion, postnasal drip and sinus pressure.   Eyes: Negative.   Respiratory: Positive for cough.        Objective:   Physical Exam  Constitutional: He appears well-developed and well-nourished.  HENT:  Right Ear: External ear normal.  Left Ear: External ear normal.  Nose: Nose normal.  Mouth/Throat: Oropharynx is clear and moist. No oropharyngeal exudate.  Eyes: Conjunctivae are normal.  Pulmonary/Chest: Effort normal and breath sounds normal.  Lymphadenopathy:    He has no cervical adenopathy.          Assessment & Plan:  Add Mucinex

## 2011-04-18 ENCOUNTER — Emergency Department (HOSPITAL_COMMUNITY)
Admission: EM | Admit: 2011-04-18 | Discharge: 2011-04-18 | Disposition: A | Discharge status: Home / Self Care | Payer: Self-pay | Attending: Emergency Medicine | Admitting: Emergency Medicine

## 2011-04-18 ENCOUNTER — Emergency Department (HOSPITAL_COMMUNITY): Payer: Self-pay

## 2011-04-18 DIAGNOSIS — M7751 Other enthesopathy of right foot: Secondary | ICD-10-CM

## 2011-04-18 DIAGNOSIS — M79609 Pain in unspecified limb: Secondary | ICD-10-CM | POA: Insufficient documentation

## 2011-04-18 DIAGNOSIS — K219 Gastro-esophageal reflux disease without esophagitis: Secondary | ICD-10-CM | POA: Insufficient documentation

## 2011-04-18 DIAGNOSIS — I10 Essential (primary) hypertension: Secondary | ICD-10-CM | POA: Insufficient documentation

## 2011-04-18 MED ORDER — OXYCODONE-ACETAMINOPHEN 5-325 MG PO TABS: 1.0000 | ORAL_TABLET | ORAL | Status: AC | PRN

## 2011-04-18 MED ORDER — NAPROXEN 500 MG PO TABS: 500.0000 mg | ORAL_TABLET | Freq: Two times a day (BID) | ORAL | Status: DC

## 2011-04-18 NOTE — ED Notes (Signed)
Pt reports right foot pain that started 2-3 days ago after working. Pt denies fall or injury. Pt says it hurts to walk but able to ambulate.

## 2011-04-18 NOTE — Discharge Instructions (Signed)
Your x-ray was normal today. Keep ACE wrap on, keep it elevated above your heart level. Ice several times a day for 20 min at a time. Naprosyn for pain and inflammation. Percocet for severe pain. Follow up with orthopedics if not improving.   Tendinitis Tendinitis is swelling and inflammation of the tendons. Tendons are band-like tissues that connect muscle to bone. Tendinitis commonly occurs in the:   Shoulders (rotator cuff).   Heels (Achilles tendon).   Elbows (triceps tendon).  CAUSES Tendinitis is usually caused by overusing the tendon, muscles, and joints involved. When the tissue surrounding a tendon (synovium) becomes inflamed, it is called tenosynovitis. Tendinitis commonly develops in people whose jobs require repetitive motions. SYMPTOMS  Pain.   Tenderness.   Mild swelling.  DIAGNOSIS Tendinitis is usually diagnosed by physical exam. Your caregiver may also order X-rays or other imaging tests. TREATMENT Your caregiver may recommend certain medicines or exercises for your treatment. HOME CARE INSTRUCTIONS   Use a sling or splint for as long as directed by your caregiver until the pain decreases.   Put ice on the injured area.   Put ice in a plastic bag.   Place a towel between your skin and the bag.   Leave the ice on for 15 to 20 minutes, 3 to 4 times a day.   Avoid using the limb while the tendon is painful. Perform gentle range of motion exercises only as directed by your caregiver. Stop exercises if pain or discomfort increase, unless directed otherwise by your caregiver.   Only take over-the-counter or prescription medicines for pain, discomfort, or fever as directed by your caregiver.  SEEK MEDICAL CARE IF:   Your pain and swelling increase.   You develop new, unexplained symptoms, especially increased numbness in the hands.  MAKE SURE YOU:   Understand these instructions.   Will watch your condition.   Will get help right away if you are not doing  well or get worse.  Document Released: 12/29/1999 Document Revised: 12/20/2010 Document Reviewed: 03/19/2010 [facility] Patient Information 2012 [place], [place].

## 2011-04-18 NOTE — ED Provider Notes (Signed)
History     CSN: [id number]  Arrival date & time 04/18/11  1512   First MD Initiated Contact with Patient 04/18/11 1620      Chief Complaint  Patient presents with  . Foot Pain    (Consider location/radiation/quality/duration/timing/severity/associated sxs/prior treatment) Patient is a 31 y.o. male presenting with lower extremity pain. The history is provided by the patient.  Foot Pain This is a new problem. The current episode started in the past 7 days. Associated symptoms include joint swelling.  PT states his right foot has been hurting and now swollen. Pain for about 3 days. No known injury. Pain with walking and being on his feet for prolonged time. Did not take any medications. No fever, chills, malaise. No other complaints.   Past Medical History  Diagnosis Date  . Hypertension   . GERD (gastroesophageal reflux disease)     Past Surgical History  Procedure Date  . 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 Nose surgery 2009    straighten nasal passage     Family History  Problem Relation Age of Onset  . Heart failure Mother   . Heart disease Mother   . Heart attack Father   . Heart disease Father     History  Substance Use Topics  . Smoking status: Never Smoker   . Smokeless tobacco: Never Used  . Alcohol Use: No      Review of Systems  Musculoskeletal: Positive for joint swelling.  All other systems reviewed and are negative.    Allergies  Beeswax; Codeine; Hydrocodone-acetaminophen; and Prednisone  Home Medications   Current Outpatient Rx  Name Route Sig Dispense Refill  . DILTIAZEM HCL 90 MG PO TABS Oral Take 1 tablet (90 mg total) by mouth daily. 30 tablet 11  . DOXYCYCLINE HYCLATE 100 MG PO CAPS Oral Take 1 capsule (100 mg total) by mouth 2 (two) times daily. 20 capsule 0  . EPINEPHRINE 0.3 MG/0.3ML IJ DEVI Intramuscular Inject 0.3 mLs (0.3 mg total) into the muscle once. 2 Device 5  . LISINOPRIL 20 MG PO TABS Oral Take 1 tablet (20 mg  total) by mouth daily. 30 tablet 11    BP 142/79  Pulse 100  Temp(Src) 99.4 F (37.4 C) (Oral)  Resp 22  SpO2 100%  Physical Exam  Nursing note and vitals reviewed. Constitutional: He is oriented to person, place, and time. He appears well-developed and well-nourished. No distress.       obese  HENT:  Head: Normocephalic and atraumatic.  Eyes: Conjunctivae are normal.  Neck: Neck supple.  Cardiovascular: Normal rate, regular rhythm and normal heart sounds.   Pulmonary/Chest: Effort normal and breath sounds normal. No respiratory distress. He has no wheezes.  Musculoskeletal: He exhibits edema and tenderness.       Pt has swelling over medial malleolus. Tender to palpation over malleolus and just posterior to the bone over Flexor tendons. Pain with dorsiflexion, and inversion. No erythema or warmth to palpation  Neurological: He is alert and oriented to person, place, and time.  Skin: Skin is warm and dry.  Psychiatric: He has a normal mood and affect.    ED Course  Procedures (including critical care time)  Labs Reviewed - No data to display Dg Ankle Complete Right  04/18/2011  *RADIOLOGY REPORT*  Clinical Data: Foot pain.  Right ankle pain.  RIGHT ANKLE - COMPLETE 3+ VIEW  Comparison: None  Findings: There is diffuse soft tissue swelling identified.  There  is no acute fracture or subluxation identified.  Posterior calcaneal and plantar heel spurs noted.  No radiopaque foreign bodies or soft tissue calcifications.  IMPRESSION:  1.  Diffuse soft tissue swelling. 2.  No fracture or subluxation.  Original Report Authenticated By: Rosealee Albee, M.D.   X-ray negative. No known injury. Pt is morbidly obese, suspect tendonitis vs sprain of his flexor tendons over medial ankle. Will d/c home with ACE wrap, pain medications. Pt is ambulatory. No signs of infection. Follow up with orthopedics.  No diagnosis found.    MDM          Lottie Mussel, PA 04/18/11 1743

## 2011-04-26 NOTE — ED Provider Notes (Signed)
Medical screening examination/treatment/procedure(s) were performed by non-physician practitioner and as supervising physician I was immediately available for consultation/collaboration.    Abdulahad Mederos L Aamari Strawderman, MD 04/26/11 1632 

## 2011-08-19 ENCOUNTER — Encounter: Payer: Self-pay | Admitting: Family Medicine

## 2011-08-19 ENCOUNTER — Ambulatory Visit (INDEPENDENT_AMBULATORY_CARE_PROVIDER_SITE_OTHER): Payer: Self-pay | Admitting: Family Medicine

## 2011-08-19 VITALS — BP 138/82 | HR 96 | Temp 98.9°F | Wt 384.0 lb

## 2011-08-19 DIAGNOSIS — I10 Essential (primary) hypertension: Secondary | ICD-10-CM

## 2011-08-19 DIAGNOSIS — J45909 Unspecified asthma, uncomplicated: Secondary | ICD-10-CM

## 2011-08-19 NOTE — Progress Notes (Signed)
  Subjective:    Patient ID: Danny Bauer, male    DOB: 1980/02/09, 31 y.o.   MRN: [id number]  HPI Here to follow up. His asthma has been well controlled this summer. His BP is stable.    Review of Systems  Constitutional: Negative.   Respiratory: Negative.   Cardiovascular: Negative.        Objective:   Physical Exam  Constitutional: He appears well-developed and well-nourished.  Neck: No thyromegaly present.  Cardiovascular: Normal rate, regular rhythm, normal heart sounds and intact distal pulses.   Pulmonary/Chest: Effort normal and breath sounds normal.  Lymphadenopathy:    He has no cervical adenopathy.          Assessment & Plan:  Stable HTN and asthma. He will set up fasting labs soon

## 2011-08-21 ENCOUNTER — Other Ambulatory Visit (INDEPENDENT_AMBULATORY_CARE_PROVIDER_SITE_OTHER): Payer: Self-pay

## 2011-08-21 DIAGNOSIS — I10 Essential (primary) hypertension: Secondary | ICD-10-CM

## 2011-08-21 LAB — CBC WITH DIFFERENTIAL/PLATELET
Eosinophils Absolute: 0.1 10*3/uL (ref 0.0–0.7)
Eosinophils Relative: 1.7 % (ref 0.0–5.0)
HCT: 42.7 % (ref 39.0–52.0)
Lymphs Abs: 1.3 10*3/uL (ref 0.7–4.0)
MCHC: 33.6 g/dL (ref 30.0–36.0)
MCV: 83.6 fl (ref 78.0–100.0)
Monocytes Absolute: 0.5 10*3/uL (ref 0.1–1.0)
Neutrophils Relative %: 73.4 % (ref 43.0–77.0)
Platelets: 198 10*3/uL (ref 150.0–400.0)
WBC: 7.1 10*3/uL (ref 4.5–10.5)

## 2011-08-21 LAB — BASIC METABOLIC PANEL
CO2: 27 mEq/L (ref 19–32)
Chloride: 99 mEq/L (ref 96–112)
Potassium: 4 mEq/L (ref 3.5–5.1)
Sodium: 134 mEq/L — ABNORMAL LOW (ref 135–145)

## 2011-08-21 LAB — HEPATIC FUNCTION PANEL
ALT: 45 U/L (ref 0–53)
AST: 44 U/L — ABNORMAL HIGH (ref 0–37)
Alkaline Phosphatase: 71 U/L (ref 39–117)
Bilirubin, Direct: 0.1 mg/dL (ref 0.0–0.3)
Total Bilirubin: 1.1 mg/dL (ref 0.3–1.2)
Total Protein: 7.5 g/dL (ref 6.0–8.3)

## 2011-08-21 LAB — LIPID PANEL
LDL Cholesterol: 73 mg/dL (ref 0–99)
Total CHOL/HDL Ratio: 4
Triglycerides: 153 mg/dL — ABNORMAL HIGH (ref 0.0–149.0)

## 2011-08-22 NOTE — Progress Notes (Signed)
Quick Note:  Spoke with pt- informed labs normal ______ 

## 2011-11-07 ENCOUNTER — Encounter (HOSPITAL_COMMUNITY): Payer: Self-pay | Admitting: *Deleted

## 2011-11-07 ENCOUNTER — Emergency Department (HOSPITAL_COMMUNITY)
Admission: EM | Admit: 2011-11-07 | Discharge: 2011-11-07 | Disposition: A | Discharge status: Home / Self Care | Payer: Self-pay | Attending: Emergency Medicine | Admitting: Emergency Medicine

## 2011-11-07 ENCOUNTER — Emergency Department (HOSPITAL_COMMUNITY): Payer: Self-pay

## 2011-11-07 DIAGNOSIS — K219 Gastro-esophageal reflux disease without esophagitis: Secondary | ICD-10-CM | POA: Insufficient documentation

## 2011-11-07 DIAGNOSIS — M25579 Pain in unspecified ankle and joints of unspecified foot: Secondary | ICD-10-CM | POA: Insufficient documentation

## 2011-11-07 DIAGNOSIS — M25571 Pain in right ankle and joints of right foot: Secondary | ICD-10-CM

## 2011-11-07 DIAGNOSIS — I10 Essential (primary) hypertension: Secondary | ICD-10-CM | POA: Insufficient documentation

## 2011-11-07 MED ORDER — NAPROXEN 500 MG PO TABS: 500.0000 mg | ORAL_TABLET | Freq: Two times a day (BID) | ORAL | Status: DC

## 2011-11-07 NOTE — ED Notes (Signed)
Pt reports right anterior foot pain at base of tibia. States that pain started this past Tuesday. States he does not recall injuring it. Pt has good pedal pulse and capillary refill on right foot. Pt reports not taking any pain meds for the foot pain.

## 2011-11-07 NOTE — ED Provider Notes (Signed)
History     CSN: [id number]  Arrival date & time 11/07/11  2103   First MD Initiated Contact with Patient 11/07/11 2121      Chief Complaint  Patient presents with  . Ankle Pain    (Consider location/radiation/quality/duration/timing/severity/associated sxs/prior treatment) Patient is a 31 y.o. male presenting with ankle pain. The history is provided by the patient.  Ankle Pain  The incident occurred more than 2 days ago. The incident occurred at work. There was no injury mechanism. The pain is present in the right ankle. The quality of the pain is described as aching. The pain is at a severity of 5/10. The pain has been constant since onset. Pertinent negatives include no numbness, no inability to bear weight, no muscle weakness, no loss of sensation and no tingling. The symptoms are aggravated by bearing weight and activity. He has tried nothing for the symptoms.  Pt states right ankle swelling and pain for 2 days now. No injuries, however states very active at work, climbing letters, states there is a possibility for injury. States no calf pain or swelling. No foot swelling. States did not take any medications. Has been working on it which is making it worse. No redness or warmth to the touch.   Past Medical History  Diagnosis Date  . Hypertension   . GERD (gastroesophageal reflux disease)     Past Surgical History  Procedure Date  . 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 Nose surgery 2009    straighten nasal passage     Family History  Problem Relation Age of Onset  . Heart failure Mother   . Heart disease Mother   . Heart attack Father   . Heart disease Father     History  Substance Use Topics  . Smoking status: Never Smoker   . Smokeless tobacco: Never Used  . Alcohol Use: No      Review of Systems  Constitutional: Negative for fever and chills.  Respiratory: Negative.   Cardiovascular: Negative.   Musculoskeletal: Positive for joint swelling.  Skin:  Negative for color change and wound.  Neurological: Negative for tingling, weakness and numbness.    Allergies  Beeswax; Codeine; Hydrocodone-acetaminophen; and Prednisone  Home Medications   Current Outpatient Rx  Name Route Sig Dispense Refill  . DILTIAZEM HCL 90 MG PO TABS Oral Take 1 tablet (90 mg total) by mouth daily. 30 tablet 11  . EPINEPHRINE 0.3 MG/0.3ML IJ DEVI Intramuscular Inject 0.3 mLs (0.3 mg total) into the muscle once. 2 Device 5  . LISINOPRIL 20 MG PO TABS Oral Take 1 tablet (20 mg total) by mouth daily. 30 tablet 11  . OMEPRAZOLE 20 MG PO CPDR Oral Take 20 mg by mouth daily.      BP 149/87  Pulse 101  Temp 98.1 F (36.7 C) (Oral)  Resp 22  Ht 6\' 2"  (1.88 m)  Wt 325 lb (147.419 kg)  BMI 41.73 kg/m2  SpO2 98%  Physical Exam  Nursing note and vitals reviewed. Constitutional: He appears well-developed and well-nourished. No distress.  Cardiovascular: Normal rate, regular rhythm and normal heart sounds.   Pulmonary/Chest: Effort normal and breath sounds normal. No respiratory distress. He has no wheezes. He has no rales.  Musculoskeletal:       Right ankle swelling noted. Tender to palpation over dorsal ankle. Normal foot, other than partial amputation of great toe. Normal skin color, no bruising, no erythema. Pain with ROM of ankle in all  directions.   Neurological: He is alert.  Skin: Skin is warm and dry.  Psychiatric: He has a normal mood and affect.    ED Course  Procedures (including critical care time)  Labs Reviewed - No data to display Dg Foot Complete Right  11/07/2011  *RADIOLOGY REPORT*  Clinical Data: Right foot pain.  RIGHT FOOT COMPLETE - 3+ VIEW  Comparison: None.  Findings: The patient is status post amputation of the distal phalanx in the great toe.  Mild diffuse edematous changes are present throughout the foot.  Degenerative changes noted at the second and third MTP joints.  There also degenerative changes in the DIP joints.  No acute  osseous abnormality is evident.  IMPRESSION:  1.  Mild edematous changes in the foot. 2.  No acute osseous abnormality. 3.  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 4.  Degenerative changes.   Original Report Authenticated By: Jamesetta Orleans. MATTERN, M.D.    Negative right ankle x-ray. No signs of infection. Doubt gout. Calf soft, non tender, doubt dvt. Will start on NSADs, ACE, follow up with orthopedics as needed.   1. Right ankle pain       MDM          Lottie Mussel, PA 11/08/11 [phone]

## 2011-11-12 NOTE — ED Provider Notes (Signed)
Medical screening examination/treatment/procedure(s) were performed by non-physician practitioner and as supervising physician I was immediately available for consultation/collaboration.  Donnetta Hutching, MD 11/12/11 [phone]

## 2012-01-28 ENCOUNTER — Other Ambulatory Visit: Payer: Self-pay | Admitting: Family Medicine

## 2012-01-28 NOTE — Telephone Encounter (Signed)
I did not see Lisinopril on list?

## 2012-01-29 NOTE — Telephone Encounter (Signed)
Refill both for one year. 

## 2012-07-31 ENCOUNTER — Ambulatory Visit: Payer: Self-pay | Admitting: Emergency Medicine

## 2012-07-31 ENCOUNTER — Other Ambulatory Visit (INDEPENDENT_AMBULATORY_CARE_PROVIDER_SITE_OTHER): Payer: Self-pay

## 2012-07-31 VITALS — BP 116/74 | HR 90 | Temp 98.1°F | Resp 24 | Ht 73.0 in | Wt 380.4 lb

## 2012-07-31 DIAGNOSIS — Z0289 Encounter for other administrative examinations: Secondary | ICD-10-CM

## 2012-07-31 DIAGNOSIS — Z0000 Encounter for general adult medical examination without abnormal findings: Secondary | ICD-10-CM

## 2012-07-31 LAB — POCT URINALYSIS DIPSTICK
Bilirubin, UA: NEGATIVE
Ketones, UA: NEGATIVE
Leukocytes, UA: NEGATIVE
Nitrite, UA: NEGATIVE
Protein, UA: NEGATIVE

## 2012-07-31 LAB — BASIC METABOLIC PANEL
BUN: 10 mg/dL (ref 6–23)
CO2: 26 mEq/L (ref 19–32)
Chloride: 105 mEq/L (ref 96–112)
Creatinine, Ser: 0.9 mg/dL (ref 0.4–1.5)

## 2012-07-31 LAB — CBC WITH DIFFERENTIAL/PLATELET
Eosinophils Absolute: 0.2 10*3/uL (ref 0.0–0.7)
MCHC: 33.4 g/dL (ref 30.0–36.0)
MCV: 86.2 fl (ref 78.0–100.0)
Monocytes Absolute: 0.4 10*3/uL (ref 0.1–1.0)
Neutrophils Relative %: 73 % (ref 43.0–77.0)
Platelets: 219 10*3/uL (ref 150.0–400.0)
RDW: 15 % — ABNORMAL HIGH (ref 11.5–14.6)

## 2012-07-31 LAB — LIPID PANEL
Cholesterol: 131 mg/dL (ref 0–200)
HDL: 27.1 mg/dL — ABNORMAL LOW (ref >39.00)
VLDL: 25.8 mg/dL (ref 0.0–40.0)

## 2012-07-31 LAB — HEPATIC FUNCTION PANEL
Bilirubin, Direct: 0.1 mg/dL (ref 0.0–0.3)
Total Bilirubin: 0.9 mg/dL (ref 0.3–1.2)

## 2012-07-31 NOTE — Progress Notes (Signed)
[facility] [address] [place] [address] [phone]  Date:  07/31/2012   Name:  Danny Bauer   DOB:  February 17, 1980   MRN:  [id number]  PCP:  Nelwyn Salisbury, MD    Chief Complaint: Employment Physical   History of Present Illness:  Danny Bauer is a 32 y.o. very pleasant male patient who presents with the following:  DOT certification   Patient Active Problem List   Diagnosis Date Noted  . MORBID OBESITY 12/17/2007  . SLEEP APNEA 12/17/2007  . CARPAL TUNNEL SYNDROME 07/01/2007  . HYPERTENSION 10/07/2006  . ASTHMA 08/27/2006  . GERD 08/27/2006    Past Medical History  Diagnosis Date  . Hypertension   . GERD (gastroesophageal reflux disease)     Past Surgical History  Procedure Laterality Date  . 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 Nose surgery  2009    straighten nasal passage     History  Substance Use Topics  . Smoking status: Never Smoker   . Smokeless tobacco: Never Used  . Alcohol Use: No    Family History  Problem Relation Age of Onset  . Heart failure Mother   . Heart disease Mother   . Heart attack Father   . Heart disease Father     Allergies  Allergen Reactions  . Beeswax     REACTION: unspecified  . Codeine Diarrhea  . Hydrocodone-Acetaminophen     REACTION: unspecified  . Prednisone Diarrhea    REACTION: unspecified    Medication list has been reviewed and updated.  Current Outpatient Prescriptions on File Prior to Visit  Medication Sig Dispense Refill  . diltiazem (CARDIZEM) 90 MG tablet TAKE ONE TABLET BY MOUTH 4 TIMES DAILY  30 tablet  10  . lisinopril (PRINIVIL,ZESTRIL) 20 MG tablet TAKE ONE TABLET BY MOUTH DAILY  30 tablet  10  . omeprazole (PRILOSEC) 20 MG capsule Take 20 mg by mouth daily.      Marland Kitchen diltiazem (CARDIZEM) 90 MG tablet Take 1 tablet (90 mg total) by mouth daily.  30 tablet  11  . EPINEPHrine (EPIPEN 2-PAK) 0.3 mg/0.3 mL DEVI Inject 0.3 mLs (0.3 mg total) into the muscle  once.  2 Device  5  . naproxen (NAPROSYN) 500 MG tablet Take 1 tablet (500 mg total) by mouth 2 (two) times daily.  30 tablet  0   No current facility-administered medications on file prior to visit.    Review of Systems:  As per HPI, otherwise negative.    Physical Examination: Filed Vitals:   07/31/12 1132  BP: 116/74  Pulse: 90  Temp: 98.1 F (36.7 C)  Resp: 24   Filed Vitals:   07/31/12 1132  Height: 6\' 1"  (1.854 m)  Weight: 380 lb 6.4 oz (172.548 kg)   Body mass index is 50.2 kg/(m^2). Ideal Body Weight: Weight in (lb) to have BMI = 25: 189.1  GEN: morbid obesity, NAD, Non-toxic, A & O x 3 HEENT: Atraumatic, Normocephalic. Neck supple. No masses, No LAD. Ears and Nose: No external deformity. CV: RRR, No M/G/R. No JVD. No thrill. No extra heart sounds. PULM: CTA B, no wheezes, crackles, rhonchi. No retractions. No resp. distress. No accessory muscle use. ABD: S, NT, ND, +BS. No rebound. No HSM. EXTR: No c/c/e NEURO Normal gait.  PSYCH: Normally interactive. Conversant. Not depressed or anxious appearing.  Calm demeanor.    Assessment and Plan: 1 month certification pending review  of CPAP results   Signed,  Phillips Odor, MD

## 2012-08-03 NOTE — Progress Notes (Signed)
Quick Note:  Pt has appointment on 08/04/12 will go over then. ______

## 2012-08-04 ENCOUNTER — Encounter: Payer: Self-pay | Admitting: Family Medicine

## 2012-08-04 ENCOUNTER — Ambulatory Visit (INDEPENDENT_AMBULATORY_CARE_PROVIDER_SITE_OTHER): Payer: Self-pay | Admitting: Family Medicine

## 2012-08-04 VITALS — BP 124/74 | HR 98 | Temp 98.3°F | Ht 72.5 in | Wt 387.0 lb

## 2012-08-04 DIAGNOSIS — Z0000 Encounter for general adult medical examination without abnormal findings: Secondary | ICD-10-CM

## 2012-08-04 MED ORDER — EPINEPHRINE 0.3 MG/0.3ML IJ SOAJ: 0.3000 mg | Freq: Once | INTRAMUSCULAR | Status: DC

## 2012-08-04 NOTE — Progress Notes (Signed)
  Subjective:    Patient ID: Danny Bauer, male    DOB: 12-10-1980, 32 y.o.   MRN: [id number]  HPI 32 yr old male for a cpx. He feels well and has no concerns. He sees Dr. Jearld Fenton for sleep apnea and they have ordered him a CPAP machine.    Review of Systems  Constitutional: Negative.   HENT: Negative.   Eyes: Negative.   Respiratory: Negative.   Cardiovascular: Negative.   Gastrointestinal: Negative.   Genitourinary: Negative.   Musculoskeletal: Negative.   Skin: Negative.   Neurological: Negative.   Psychiatric/Behavioral: Negative.        Objective:   Physical Exam  Constitutional: He is oriented to person, place, and time. He appears well-developed and well-nourished. No distress.  HENT:  Head: Normocephalic and atraumatic.  Right Ear: External ear normal.  Left Ear: External ear normal.  Nose: Nose normal.  Mouth/Throat: Oropharynx is clear and moist. No oropharyngeal exudate.  Eyes: Conjunctivae and EOM are normal. Pupils are equal, round, and reactive to light. Right eye exhibits no discharge. Left eye exhibits no discharge. No scleral icterus.  Neck: Neck supple. No JVD present. No tracheal deviation present. No thyromegaly present.  Cardiovascular: Normal rate, regular rhythm, normal heart sounds and intact distal pulses.  Exam reveals no gallop and no friction rub.   No murmur heard. Pulmonary/Chest: Effort normal and breath sounds normal. No respiratory distress. He has no wheezes. He has no rales. He exhibits no tenderness.  Abdominal: Soft. Bowel sounds are normal. He exhibits no distension and no mass. There is no tenderness. There is no rebound and no guarding.  Genitourinary: Rectum normal, prostate normal and penis normal. Guaiac negative stool. No penile tenderness.  Musculoskeletal: Normal range of motion. He exhibits no edema and no tenderness.  Lymphadenopathy:    He has no cervical adenopathy.  Neurological: He is alert and oriented to person, place,  and time. He has normal reflexes. No cranial nerve deficit. He exhibits normal muscle tone. Coordination normal.  Skin: Skin is warm and dry. No rash noted. He is not diaphoretic. No erythema. No pallor.  Psychiatric: He has a normal mood and affect. His behavior is normal. Judgment and thought content normal.          Assessment & Plan:  Well exam. Advised him to lose weight.

## 2012-08-07 ENCOUNTER — Encounter: Payer: Self-pay | Admitting: Emergency Medicine

## 2012-08-31 ENCOUNTER — Ambulatory Visit (INDEPENDENT_AMBULATORY_CARE_PROVIDER_SITE_OTHER): Payer: Self-pay | Admitting: Emergency Medicine

## 2012-08-31 VITALS — BP 144/90 | HR 84 | Temp 99.1°F | Resp 20 | Ht 72.5 in | Wt 385.0 lb

## 2012-08-31 DIAGNOSIS — Z0289 Encounter for other administrative examinations: Secondary | ICD-10-CM

## 2012-08-31 NOTE — Progress Notes (Signed)
[facility] [address], [place] [address] [phone]  Date:  08/31/2012   Name:  Danny Bauer   DOB:  1980-02-11   MRN:  [id number]  PCP:  Nelwyn Salisbury, MD    Chief Complaint: recertifcation DOT   History of Present Illness:  Danny Bauer is a 32 y.o. very pleasant male patient who presents with the following:  Review DOT paperwork related to sleep apnea.  Just received machine Thursday.   Patient Active Problem List   Diagnosis Date Noted  . MORBID OBESITY 12/17/2007  . SLEEP APNEA 12/17/2007  . CARPAL TUNNEL SYNDROME 07/01/2007  . HYPERTENSION 10/07/2006  . ASTHMA 08/27/2006  . GERD 08/27/2006    Past Medical History  Diagnosis Date  . Hypertension   . GERD (gastroesophageal reflux disease)   . Sleep apnea, obstructive     sees Dr. Suzanna Obey     Past Surgical History  Procedure Laterality Date  . 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 Nose surgery  2009    straighten nasal passage     History  Substance Use Topics  . Smoking status: Never Smoker   . Smokeless tobacco: Never Used  . Alcohol Use: No    Family History  Problem Relation Age of Onset  . Heart failure Mother   . Heart disease Mother   . Heart attack Father   . Heart disease Father     Allergies  Allergen Reactions  . Beeswax     REACTION: unspecified  . Codeine Diarrhea  . Hydrocodone-Acetaminophen     REACTION: unspecified  . Prednisone Diarrhea    REACTION: unspecified    Medication list has been reviewed and updated.  Current Outpatient Prescriptions on File Prior to Visit  Medication Sig Dispense Refill  . diltiazem (CARDIZEM) 90 MG tablet TAKE ONE TABLET BY MOUTH 4 TIMES DAILY  30 tablet  10  . EPINEPHrine (EPIPEN 2-PAK) 0.3 mg/0.3 mL SOAJ Inject 0.3 mLs (0.3 mg total) into the muscle once.  1 Device  5  . lisinopril (PRINIVIL,ZESTRIL) 20 MG tablet TAKE ONE TABLET BY MOUTH DAILY  30 tablet  10  . naproxen (NAPROSYN) 500 MG tablet  Take 1 tablet (500 mg total) by mouth 2 (two) times daily.  30 tablet  0  . omeprazole (PRILOSEC) 20 MG capsule Take 20 mg by mouth daily.       No current facility-administered medications on file prior to visit.    Review of Systems:  As per HPI, otherwise negative.    Physical Examination: Filed Vitals:   08/31/12 0852  BP: 144/90  Pulse: 84  Temp: 99.1 F (37.3 C)  Resp: 20   Filed Vitals:   08/31/12 0852  Height: 6' 0.5" (1.842 m)  Weight: 385 lb (174.635 kg)   Body mass index is 51.47 kg/(m^2). Ideal Body Weight: Weight in (lb) to have BMI = 25: 186.5   GEN: WDWN, NAD, Non-toxic, Alert & Oriented x 3 HEENT: Atraumatic, Normocephalic.  Ears and Nose: No external deformity. EXTR: No clubbing/cyanosis/edema NEURO: Normal gait.  PSYCH: Normally interactive. Conversant. Not depressed or anxious appearing.  Calm demeanor.    Assessment and Plan: OSA   Signed,  Phillips Odor, MD

## 2012-10-15 ENCOUNTER — Ambulatory Visit (INDEPENDENT_AMBULATORY_CARE_PROVIDER_SITE_OTHER): Payer: Self-pay | Admitting: Emergency Medicine

## 2012-10-15 VITALS — BP 148/78 | HR 109 | Temp 99.4°F | Resp 20 | Ht 72.0 in | Wt 387.6 lb

## 2012-10-15 DIAGNOSIS — Z0289 Encounter for other administrative examinations: Secondary | ICD-10-CM

## 2012-10-15 NOTE — Progress Notes (Signed)
[facility] [address] [place] [address] [phone]  Date:  10/15/2012   Name:  Danny Bauer   DOB:  02-May-1980   MRN:  [id number]  PCP:  Nelwyn Salisbury, MD    Chief Complaint: No chief complaint on file.   History of Present Illness:  Danny Bauer is a 32 y.o. very pleasant male patient who presents with the following:  For reissue of DOT card after having sleep study  Patient Active Problem List   Diagnosis Date Noted  . MORBID OBESITY 12/17/2007  . SLEEP APNEA 12/17/2007  . CARPAL TUNNEL SYNDROME 07/01/2007  . HYPERTENSION 10/07/2006  . ASTHMA 08/27/2006  . GERD 08/27/2006    Past Medical History  Diagnosis Date  . Hypertension   . GERD (gastroesophageal reflux disease)   . Sleep apnea, obstructive     sees Dr. Suzanna Obey     Past Surgical History  Procedure Laterality Date  . 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 Nose surgery  2009    straighten nasal passage     History  Substance Use Topics  . Smoking status: Never Smoker   . Smokeless tobacco: Never Used  . Alcohol Use: No    Family History  Problem Relation Age of Onset  . Heart failure Mother   . Heart disease Mother   . Heart attack Father   . Heart disease Father     Allergies  Allergen Reactions  . Beeswax     REACTION: unspecified  . Codeine Diarrhea  . Hydrocodone-Acetaminophen     REACTION: unspecified  . Prednisone Diarrhea    REACTION: unspecified    Medication list has been reviewed and updated.  Current Outpatient Prescriptions on File Prior to Visit  Medication Sig Dispense Refill  . diltiazem (CARDIZEM) 90 MG tablet TAKE ONE TABLET BY MOUTH 4 TIMES DAILY  30 tablet  10  . EPINEPHrine (EPIPEN 2-PAK) 0.3 mg/0.3 mL SOAJ Inject 0.3 mLs (0.3 mg total) into the muscle once.  1 Device  5  . lisinopril (PRINIVIL,ZESTRIL) 20 MG tablet TAKE ONE TABLET BY MOUTH DAILY  30 tablet  10  . naproxen (NAPROSYN) 500 MG tablet Take 1 tablet (500 mg  total) by mouth 2 (two) times daily.  30 tablet  0  . omeprazole (PRILOSEC) 20 MG capsule Take 20 mg by mouth daily.       No current facility-administered medications on file prior to visit.    Review of Systems:  As per HPI, otherwise negative.    Physical Examination: Filed Vitals:   10/15/12 1712  BP: 148/78  Pulse: 109  Temp: 99.4 F (37.4 C)  Resp: 20   Filed Vitals:   10/15/12 1712  Height: 6' (1.829 m)  Weight: 387 lb 9.6 oz (175.814 kg)   Body mass index is 52.56 kg/(m^2). Ideal Body Weight: Weight in (lb) to have BMI = 25: 183.9   GEN: WDWN, NAD, Non-toxic, Alert & Oriented x 3 HEENT: Atraumatic, Normocephalic.  Ears and Nose: No external deformity. EXTR: No clubbing/cyanosis/edema NEURO: Normal gait.  PSYCH: Normally interactive. Conversant. Not depressed or anxious appearing.  Calm demeanor.    Assessment and Plan: Sleep apnea   Signed,  Phillips Odor, MD

## 2012-12-13 ENCOUNTER — Emergency Department (HOSPITAL_COMMUNITY)
Admission: EM | Admit: 2012-12-13 | Discharge: 2012-12-14 | Disposition: A | Discharge status: Home / Self Care | Payer: Self-pay | Attending: Emergency Medicine | Admitting: Emergency Medicine

## 2012-12-13 ENCOUNTER — Emergency Department (HOSPITAL_COMMUNITY): Payer: Self-pay

## 2012-12-13 ENCOUNTER — Encounter (HOSPITAL_COMMUNITY): Payer: Self-pay | Admitting: Emergency Medicine

## 2012-12-13 DIAGNOSIS — M25579 Pain in unspecified ankle and joints of unspecified foot: Secondary | ICD-10-CM | POA: Insufficient documentation

## 2012-12-13 DIAGNOSIS — Z8669 Personal history of other diseases of the nervous system and sense organs: Secondary | ICD-10-CM | POA: Insufficient documentation

## 2012-12-13 DIAGNOSIS — K219 Gastro-esophageal reflux disease without esophagitis: Secondary | ICD-10-CM | POA: Insufficient documentation

## 2012-12-13 DIAGNOSIS — I10 Essential (primary) hypertension: Secondary | ICD-10-CM | POA: Insufficient documentation

## 2012-12-13 DIAGNOSIS — M79672 Pain in left foot: Secondary | ICD-10-CM

## 2012-12-13 DIAGNOSIS — Z79899 Other long term (current) drug therapy: Secondary | ICD-10-CM | POA: Insufficient documentation

## 2012-12-13 NOTE — ED Notes (Signed)
Pt arrived to ED with a complaint of right heel pain.  Pt states he has no reason he knows that heel should be bothering him.  Pt states that the pain has been unbearable and he is not able to sleep

## 2012-12-13 NOTE — ED Provider Notes (Signed)
CSN: [id number]     Arrival date & time 12/13/12  2250 History  This chart was scribed for non-physician practitioner Kyung Bacca, PA-A working with Gavin Pound. Oletta Lamas, MD by Caryn Bee, ED Scribe. This patient was seen in room WA25/WA25 and the patient's care was started at 11:05 PM.    Chief Complaint  Patient presents with  . Foot Pain   HPI HPI Comments: Danny Bauer is a 32 y.o. male who presents to the Emergency Department complaining of sudden onset right heel pain that began this afternoon while pt was walking normally. Pt states that when he sat down and put his feet up he noticed throbbing pain in his right heel. Pain is worsened when pt is not bearing weight. Pt denies recent injury to the affected foot or similar pain in the past. Pt states the pain does not radiate. Pt denies fever, numbness, differences in skin. He works in a [employer] and is on his feet most of the day.    Past Medical History  Diagnosis Date  . Hypertension   . GERD (gastroesophageal reflux disease)   . Sleep apnea, obstructive     sees Dr. Suzanna Obey    Past Surgical History  Procedure Laterality Date  . 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 Nose surgery  2009    straighten nasal passage    Family History  Problem Relation Age of Onset  . Heart failure Mother   . Heart disease Mother   . Heart attack Father   . Heart disease Father    History  Substance Use Topics  . Smoking status: Never Smoker   . Smokeless tobacco: Never Used  . Alcohol Use: No    Review of Systems  Constitutional: Negative for fever.  Musculoskeletal: Positive for arthralgias (Right heel pain).  Skin: Negative for color change, pallor, rash and wound.  Neurological: Negative for numbness.  All other systems reviewed and are negative.    Allergies  Beeswax; Codeine; Hydrocodone-acetaminophen; and Prednisone  Home Medications   Current Outpatient Rx  Name  Route  Sig  Dispense  Refill  .  diltiazem (CARDIZEM) 90 MG tablet      TAKE ONE TABLET BY MOUTH 4 TIMES DAILY   30 tablet   10   . EPINEPHrine (EPIPEN 2-PAK) 0.3 mg/0.3 mL SOAJ   Intramuscular   Inject 0.3 mLs (0.3 mg total) into the muscle once.   1 Device   5   . lisinopril (PRINIVIL,ZESTRIL) 20 MG tablet      TAKE ONE TABLET BY MOUTH DAILY   30 tablet   10   . naproxen (NAPROSYN) 500 MG tablet   Oral   Take 1 tablet (500 mg total) by mouth 2 (two) times daily.   30 tablet   0   . omeprazole (PRILOSEC) 20 MG capsule   Oral   Take 20 mg by mouth daily.          BP 148/73  Pulse 84  Temp(Src) 97.6 F (36.4 C) (Oral)  Resp 20  SpO2 95%  Physical Exam  Nursing note and vitals reviewed. Constitutional: He is oriented to person, place, and time. He appears well-developed and well-nourished. No distress.  HENT:  Head: Normocephalic and atraumatic.  Neck: Normal range of motion. Neck supple.  Pulmonary/Chest: No respiratory distress.  Musculoskeletal: He exhibits tenderness.  Neurological: He is alert and oriented to person, place, and time.  Skin: He is not diaphoretic.  ED Course  Procedures (including critical care time) DIAGNOSTIC STUDIES: Oxygen Saturation is 95% on room air, normal by my interpretation.    COORDINATION OF CARE: 11:14 PM-Discussed treatment plan with pt at bedside and pt agreed to plan.   Labs Review Labs Reviewed - No data to display Imaging Review Dg Foot Complete Right  12/14/2012   CLINICAL DATA:  Right heel pain, possible stress fracture.  EXAM: RIGHT FOOT COMPLETE - 3+ VIEW  COMPARISON:  Right foot radiographs November 07, 2011  FINDINGS: There is no evidence of fracture or dislocation. Status post apparent 1st digit amputation at the interphalangeal joint. 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 There is no evidence of arthropathy or other focal bone abnormality. Similar enthesopathy  at the Achilles insertion on the calcaneus. No subcutaneous gas or radiopaque foreign bodies.  IMPRESSION: No acute fracture deformity or dislocation. No radiographic findings of stress injury though, MRI would be more sensitive.  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   Electronically Signed   By: Awilda Metro   On: 12/14/2012 00:44    EKG Interpretation   None       MDM   1. Left foot pain    Morbidly obese 32yo M presents w/ non-traumatic L heel pain since this afternoon.  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  On exam, afebrile, pes planus, no skin changes, no tenderness, NV intact.  Xray ordered to r/o stress fx though atypical presentation of plantar fasciitis more likely.  11:21 PM   Xray negative.  Prescribed ultram for pain (several allergies) and I recommended low dose NSAID (HTN), supportive shoes at all times and stretching exercises.  Referred to ortho for persistent/worsening sx.  Return precautions including fever, skin changes, edema, worsening pain discussed.   I personally performed the services described in this documentation, which was scribed in my presence. The recorded information has been reviewed and is accurate.    Otilio Miu, PA-C 12/14/12 [phone]

## 2012-12-14 MED ORDER — OXYCODONE-ACETAMINOPHEN 5-325 MG PO TABS: 1.0000 | ORAL_TABLET | ORAL | Status: DC | PRN

## 2012-12-14 MED ORDER — TRAMADOL HCL 50 MG PO TABS: 50.0000 mg | ORAL_TABLET | Freq: Four times a day (QID) | ORAL | Status: DC | PRN

## 2012-12-14 NOTE — ED Provider Notes (Signed)
Medical screening examination/treatment/procedure(s) were performed by non-physician practitioner and as supervising physician I was immediately available for consultation/collaboration.  EKG Interpretation   None         Dominick Morella Y. Aser Nylund, MD 12/14/12 0619 

## 2013-02-04 ENCOUNTER — Emergency Department (HOSPITAL_COMMUNITY)
Admission: EM | Admit: 2013-02-04 | Discharge: 2013-02-04 | Disposition: A | Discharge status: Home / Self Care | Payer: Self-pay | Attending: Emergency Medicine | Admitting: Emergency Medicine

## 2013-02-04 ENCOUNTER — Emergency Department (HOSPITAL_COMMUNITY): Payer: Self-pay

## 2013-02-04 ENCOUNTER — Encounter (HOSPITAL_COMMUNITY): Payer: Self-pay | Admitting: Emergency Medicine

## 2013-02-04 DIAGNOSIS — M1990 Unspecified osteoarthritis, unspecified site: Secondary | ICD-10-CM

## 2013-02-04 DIAGNOSIS — Z79899 Other long term (current) drug therapy: Secondary | ICD-10-CM | POA: Insufficient documentation

## 2013-02-04 DIAGNOSIS — K219 Gastro-esophageal reflux disease without esophagitis: Secondary | ICD-10-CM | POA: Insufficient documentation

## 2013-02-04 DIAGNOSIS — M1710 Unilateral primary osteoarthritis, unspecified knee: Secondary | ICD-10-CM | POA: Insufficient documentation

## 2013-02-04 DIAGNOSIS — IMO0001 Reserved for inherently not codable concepts without codable children: Secondary | ICD-10-CM | POA: Insufficient documentation

## 2013-02-04 DIAGNOSIS — Z8669 Personal history of other diseases of the nervous system and sense organs: Secondary | ICD-10-CM | POA: Insufficient documentation

## 2013-02-04 DIAGNOSIS — M25562 Pain in left knee: Secondary | ICD-10-CM

## 2013-02-04 DIAGNOSIS — M25569 Pain in unspecified knee: Secondary | ICD-10-CM | POA: Insufficient documentation

## 2013-02-04 DIAGNOSIS — I10 Essential (primary) hypertension: Secondary | ICD-10-CM | POA: Insufficient documentation

## 2013-02-04 DIAGNOSIS — IMO0002 Reserved for concepts with insufficient information to code with codable children: Secondary | ICD-10-CM | POA: Insufficient documentation

## 2013-02-04 MED ORDER — NAPROXEN 500 MG PO TABS: 500.0000 mg | ORAL_TABLET | Freq: Two times a day (BID) | ORAL | Status: DC

## 2013-02-04 NOTE — ED Provider Notes (Signed)
CSN: [id number]     Arrival date & time 02/04/13  1521 History  This chart was scribed for non-physician practitioner working with Joya Gaskinsonald W Wickline, MD by Ronal Fearuke Okeke, ED scribe. This patient was seen in room TR08C/TR08C and the patient's care was started at 4:07 PM.    First MD Initiated Contact with Patient 02/04/13 1534     Chief Complaint  Patient presents with  . Leg Pain   (Consider location/radiation/quality/duration/timing/severity/associated sxs/prior Treatment) The history is provided by the patient and medical records. No language interpreter was used.   HPI Comments: Danny Bauer is a 33 y.o. male who presents to the Emergency Department complaining of left knee and hip pain for the past few weeks which has not resolved. Denies injury. Pt is ambulatory while at ED. Pt states that the pain is worse when walking and driving long distances and is now keeping him from sleep. Pt has been using icy hot and aleve with relief.  He denies back pain or recent knee surgeries.  Past Medical History  Diagnosis Date  . Hypertension   . GERD (gastroesophageal reflux disease)   . Sleep apnea, obstructive     sees Dr. Suzanna ObeyJohn Byers    Past Surgical History  Procedure Laterality Date  . 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 Nose surgery  2009    straighten nasal passage    Family History  Problem Relation Age of Onset  . Heart failure Mother   . Heart disease Mother   . Heart attack Father   . Heart disease Father    History  Substance Use Topics  . Smoking status: Never Smoker   . Smokeless tobacco: Never Used  . Alcohol Use: No    Review of Systems  Musculoskeletal: Positive for arthralgias and myalgias. Negative for back pain.  All other systems reviewed and are negative.    Allergies  Bee venom; Codeine; Hydrocodone-acetaminophen; and Prednisone  Home Medications   Current Outpatient Rx  Name  Route  Sig  Dispense  Refill  . allopurinol (ZYLOPRIM) 300 MG  tablet   Oral   Take 300 mg by mouth every morning.         . diltiazem (CARDIZEM) 90 MG tablet   Oral   Take 90 mg by mouth every morning.         Marland Kitchen. EPINEPHrine (EPIPEN 2-PAK) 0.3 mg/0.3 mL SOAJ   Intramuscular   Inject 0.3 mLs (0.3 mg total) into the muscle once.   1 Device   5   . lisinopril (PRINIVIL,ZESTRIL) 20 MG tablet   Oral   Take 20 mg by mouth every morning.         Marland Kitchen. omeprazole (PRILOSEC) 20 MG capsule   Oral   Take 20 mg by mouth every morning.          . naproxen (NAPROSYN) 500 MG tablet   Oral   Take 1 tablet (500 mg total) by mouth 2 (two) times daily.   30 tablet   0    BP 149/94  Pulse 80  Temp(Src) 98.1 F (36.7 C) (Oral)  Resp 18  SpO2 98% Physical Exam  Nursing note and vitals reviewed. Constitutional: He is oriented to person, place, and time. He appears well-developed and well-nourished. No distress.  Morbidly obese   HENT:  Head: Normocephalic and atraumatic.  Eyes: Conjunctivae and EOM are normal.  Neck: Normal range of motion. Neck supple.  Cardiovascular: Normal rate, regular  rhythm and normal heart sounds.   Pulmonary/Chest: Effort normal and breath sounds normal.  Musculoskeletal: Normal range of motion. He exhibits no edema and no tenderness.  No swelling to left knee non tender to left knee, full ROM to left knee. Lumbar back non tender; negative straight leg raise.  Neurological: He is alert and oriented to person, place, and time.  Skin: Skin is warm and dry.  Psychiatric: He has a normal mood and affect. His behavior is normal.    ED Course  Procedures (including critical care time) DIAGNOSTIC STUDIES: Oxygen Saturation is 98% on room air, normal by my interpretation.    COORDINATION OF CARE:  4:09 PM Discussed course of care with pt . Pt understands including an X-ray and agrees.  Labs Review Labs Reviewed - No data to display Imaging Review Dg Knee Complete 4 Views Left  02/04/2013   CLINICAL DATA:  Left knee  pain for 2 weeks.  EXAM: LEFT KNEE - COMPLETE 4+ VIEW  COMPARISON:  None.  FINDINGS: Osteophytes in the knee and most prominent at patellofemoral compartment. No significant joint effusion. The left knee is located without a fracture.  IMPRESSION: Mild osteoarthritis in the left knee.  No acute bone abnormality.   Electronically Signed   By: Richarda Overlie M.D.   On: 02/04/2013 17:00    EKG Interpretation   None       MDM   1. Knee pain, left   2. Osteoarthritis    No associated back pain. Ambulating without difficulty. Morbidly obese. Xray showing mild osteoarthritis and left knee. Advised NSAIDs, f/u with ortho if no improvement in 1-2 weeks. Neurovascularly intact. Return precautions given. Patient states understanding of treatment care plan and is agreeable.   I personally performed the services described in this documentation, which was scribed in my presence. The recorded information has been reviewed and is accurate.     Trevor Mace, PA-C 02/04/13 1713

## 2013-02-04 NOTE — ED Notes (Signed)
Pt returned from xray

## 2013-02-04 NOTE — Discharge Instructions (Signed)
Osteoarthritis Osteoarthritis is a disease that causes soreness and swelling (inflammation) of a joint. It occurs when the cartilage at the affected joint wears down. Cartilage acts as a cushion, covering the ends of bones where they meet to form a joint. Osteoarthritis is the most common form of arthritis. It often occurs in older people. The joints affected most often by this condition include those in the:  Ends of the fingers.  Thumbs.  Neck.  Lower back.  Knees.  Hips. CAUSES  Over time, the cartilage that covers the ends of bones begins to wear away. This causes bone to rub on bone, producing pain and stiffness in the affected joints.  RISK FACTORS Certain factors can increase your chances of having osteoarthritis, including:  Older age.  Excessive body weight.  Overuse of joints. SIGNS AND SYMPTOMS   Pain, swelling, and stiffness in the joint.  Over time, the joint may lose its normal shape.  Small deposits of bone (osteophytes) may grow on the edges of the joint.  Bits of bone or cartilage can break off and float inside the joint space. This may cause more pain and damage. DIAGNOSIS  Your health care provider will do a physical exam and ask about your symptoms. Various tests may be ordered, such as:  X-rays of the affected joint.  An MRI scan.  Blood tests to rule out other types of arthritis.  Joint fluid tests. This involves using a needle to draw fluid from the joint and examining the fluid under a microscope. TREATMENT  Goals of treatment are to control pain and improve joint function. Treatment plans may include:  A prescribed exercise program that allows for rest and joint relief.  A weight control plan.  Pain relief techniques, such as:  Properly applied heat and cold.  Electric pulses delivered to nerve endings under the skin (transcutaneous electrical nerve stimulation, TENS).  Massage.  Certain nutritional supplements.  Medicines to  control pain, such as:  Acetaminophen.  Nonsteroidal anti-inflammatory drugs (NSAIDs), such as naproxen.  Narcotic or central-acting agents, such as tramadol.  Corticosteroids. These can be given orally or as an injection.  Surgery to reposition the bones and relieve pain (osteotomy) or to remove loose pieces of bone and cartilage. Joint replacement may be needed in advanced states of osteoarthritis. HOME CARE INSTRUCTIONS   Only take over-the-counter or prescription medicines as directed by your health care provider. Take all medicines exactly as instructed.  Maintain a healthy weight. Follow your health care provider's instructions for weight control. This may include dietary instructions.  Exercise as directed. Your health care provider can recommend specific types of exercise. These may include:  Strengthening exercises These are done to strengthen the muscles that support joints affected by arthritis. They can be performed with weights or with exercise bands to add resistance.  Aerobic activities These are exercises, such as brisk walking or low-impact aerobics, that get your heart pumping.  Range-of-motion activities These keep your joints limber.  Balance and agility exercises These help you maintain daily living skills.  Rest your affected joints as directed by your health care provider.  Follow up with your health care provider as directed. SEEK MEDICAL CARE IF:   Your skin turns red.  You develop a rash in addition to your joint pain.  You have worsening joint pain. SEEK IMMEDIATE MEDICAL CARE IF:  You have a significant loss of weight or appetite.  You have a fever along with joint or muscle aches.  You have  night sweats. FOR MORE INFORMATION  National Institute of Arthritis and Musculoskeletal and Skin Diseases: www.niams.http://www.myers.net/nih.gov [employer] Aging: https://walker.com/www.nia.nih.gov American College of Rheumatology: www.rheumatology.org Document Released: 12/31/2004  Document Revised: 10/21/2012 Document Reviewed: 09/07/2012 [facility] Patient Information 2014 [employer], [place].  Knee Pain Knee pain can be a result of an injury or other medical conditions. Treatment will depend on the cause of your pain. HOME CARE  Only take medicine as told by your doctor.  Keep a healthy weight. Being overweight can make the knee hurt more.  Stretch before exercising or playing sports.  If there is constant knee pain, change the way you exercise. Ask your doctor for advice.  Make sure shoes fit well. Choose the right shoe for the sport or activity.  Protect your knees. Wear kneepads if needed.  Rest when you are tired. GET HELP RIGHT AWAY IF:   Your knee pain does not stop.  Your knee pain does not get better.  Your knee joint feels hot to the touch.  You have a fever. MAKE SURE YOU:   Understand these instructions.  Will watch this condition.  Will get help right away if you are not doing well or get worse. Document Released: 03/29/2008 Document Revised: 03/25/2011 Document Reviewed: 03/29/2008 [facility] Patient Information 2014 [employer], [place].

## 2013-02-04 NOTE — ED Notes (Signed)
Pt c/o L knee and L hip pain for past few weeks. Denies any injuries. Cms intact. ambulatory

## 2013-02-05 NOTE — ED Provider Notes (Signed)
Medical screening examination/treatment/procedure(s) were performed by non-physician practitioner and as supervising physician I was immediately available for consultation/collaboration.  EKG Interpretation   None         Joya Gaskinsonald W Dravin Lance, MD 02/05/13 1113

## 2013-02-17 ENCOUNTER — Other Ambulatory Visit: Payer: Self-pay | Admitting: Family Medicine

## 2013-03-03 ENCOUNTER — Other Ambulatory Visit: Payer: Self-pay | Admitting: Family Medicine

## 2013-03-04 NOTE — Telephone Encounter (Signed)
Do you refill this and is the dose correct?

## 2013-03-17 ENCOUNTER — Ambulatory Visit (INDEPENDENT_AMBULATORY_CARE_PROVIDER_SITE_OTHER): Payer: Self-pay | Admitting: Family Medicine

## 2013-03-17 ENCOUNTER — Encounter: Payer: Self-pay | Admitting: Family Medicine

## 2013-03-17 VITALS — BP 138/70 | HR 107 | Temp 98.4°F | Ht 72.0 in | Wt >= 6400 oz

## 2013-03-17 DIAGNOSIS — G5600 Carpal tunnel syndrome, unspecified upper limb: Secondary | ICD-10-CM

## 2013-03-17 MED ORDER — LISINOPRIL-HYDROCHLOROTHIAZIDE 20-25 MG PO TABS: 1.0000 | ORAL_TABLET | Freq: Every day | ORAL | Status: DC

## 2013-03-17 MED ORDER — NAPROXEN 500 MG PO TABS: 500.0000 mg | ORAL_TABLET | Freq: Two times a day (BID) | ORAL | Status: DC

## 2013-03-17 NOTE — Progress Notes (Signed)
   Subjective:    Patient ID: Danny Bauer, male    DOB: November 20, 1980, 33 y.o.   MRN: [id number]  HPI Here for 2 months of numbness and tingling in both hands. No neck or back issues. No changes in skin color. 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    Review of Systems  Constitutional: Negative.   Neurological: Positive for numbness. Negative for weakness.       Objective:   Physical Exam  Constitutional: He appears well-developed and well-nourished.  Neck: Normal range of motion. Neck supple.  Cardiovascular: Intact distal pulses.   Musculoskeletal: Normal range of motion. He exhibits no edema and no tenderness.          Assessment & Plan:  Carpal tunnel. Wear wrist splints as much as possible. Take naproxen bid.

## 2013-03-17 NOTE — Progress Notes (Signed)
Pre visit review using our clinic review tool, if applicable. No additional management support is needed unless otherwise documented below in the visit note. 

## 2013-07-24 ENCOUNTER — Encounter (HOSPITAL_COMMUNITY): Payer: Self-pay | Admitting: Emergency Medicine

## 2013-07-24 ENCOUNTER — Emergency Department (INDEPENDENT_AMBULATORY_CARE_PROVIDER_SITE_OTHER)
Admission: EM | Admit: 2013-07-24 | Discharge: 2013-07-24 | Disposition: A | Discharge status: Home / Self Care | Payer: BC Managed Care – PPO | Source: Home / Self Care | Attending: Emergency Medicine | Admitting: Emergency Medicine

## 2013-07-24 ENCOUNTER — Emergency Department (INDEPENDENT_AMBULATORY_CARE_PROVIDER_SITE_OTHER): Payer: BC Managed Care – PPO

## 2013-07-24 DIAGNOSIS — M79609 Pain in unspecified limb: Secondary | ICD-10-CM

## 2013-07-24 DIAGNOSIS — M79671 Pain in right foot: Secondary | ICD-10-CM

## 2013-07-24 NOTE — ED Provider Notes (Signed)
CSN: [id number]     Arrival date & time 07/24/13  1324 History   First MD Initiated Contact with Patient 07/24/13 1401     Chief Complaint  Patient presents with  . Foot Pain    Right   (Consider location/radiation/quality/duration/timing/severity/associated sxs/prior Treatment) HPI Comments: 33 year old male presents complaining of pain in his right foot since Monday, about one week ago. He denies any injury or any inciting event. He has pain in the ball of his foot that spreads out around his entire foot with any weightbearing activity. He has never had this before. He denies any numbness or swelling in the feet. No other symptoms or other areas of pain  Patient is a 33 y.o. male presenting with lower extremity pain.  Foot Pain    Past Medical History  Diagnosis Date  . Hypertension   . GERD (gastroesophageal reflux disease)   . Sleep apnea, obstructive     sees Dr. Suzanna Obey    Past Surgical History  Procedure Laterality Date  . 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 Nose surgery  2009    straighten nasal passage    Family History  Problem Relation Age of Onset  . Heart failure Mother   . Heart disease Mother   . Heart attack Father   . Heart disease Father    History  Substance Use Topics  . Smoking status: Never Smoker   . Smokeless tobacco: Never Used  . Alcohol Use: No    Review of Systems  Musculoskeletal:       Foot pain, See history of present illness  All other systems reviewed and are negative.   Allergies  Bee venom; Codeine; Hydrocodone-acetaminophen; and Prednisone  Home Medications   Prior to Admission medications   Medication Sig Start Date End Date Taking? Authorizing Provider  allopurinol (ZYLOPRIM) 300 MG tablet Take 300 mg by mouth every morning.   Yes Historical Provider, MD  diltiazem (CARDIZEM) 90 MG tablet TAKE ONE TABLET BY MOUTH 4 TIMES DAILY   Yes Nelwyn Salisbury, MD  EPINEPHrine (EPIPEN 2-PAK) 0.3 mg/0.3 mL SOAJ Inject 0.3 mLs  (0.3 mg total) into the muscle once. 08/04/12  Yes Nelwyn Salisbury, MD  lisinopril-hydrochlorothiazide (PRINZIDE,ZESTORETIC) 20-25 MG per tablet Take 1 tablet by mouth daily. 03/17/13  Yes Nelwyn Salisbury, MD  naproxen (NAPROSYN) 500 MG tablet Take 1 tablet (500 mg total) by mouth 2 (two) times daily. 03/17/13  Yes Nelwyn Salisbury, MD  omeprazole (PRILOSEC) 20 MG capsule Take 20 mg by mouth every morning.    Yes Historical Provider, MD  diltiazem (CARDIZEM) 90 MG tablet Take 90 mg by mouth every morning.    Historical Provider, MD  lisinopril (PRINIVIL,ZESTRIL) 20 MG tablet Take 20 mg by mouth every morning.    Historical Provider, MD   BP 122/89  Pulse 66  Temp(Src) 98.9 F (37.2 C) (Oral)  Resp 22  SpO2 96% Physical Exam  Nursing note and vitals reviewed. Constitutional: He is oriented to person, place, and time. He appears well-developed and well-nourished. No distress.  HENT:  Head: Normocephalic.  Cardiovascular:  Pulses:      Dorsalis pedis pulses are 2+ on the right side.  Pulmonary/Chest: Effort normal. No respiratory distress.  Musculoskeletal:       Feet:  Minimal peripheral edema, equal bilaterally, this is chronic  Neurological: He is alert and oriented to person, place, and time. No sensory deficit. Coordination normal.  Skin: Skin is  warm and dry. No rash noted. He is not diaphoretic.  Psychiatric: He has a normal mood and affect. Judgment normal.    ED Course  Procedures (including critical care time) Labs Review Labs Reviewed - No data to display  Imaging Review Dg Foot Complete Right  07/24/2013   CLINICAL DATA:  Dorsal right foot pain for 1 week.  EXAM: RIGHT FOOT COMPLETE - 3+ VIEW  COMPARISON:  Plain films right foot 12/13/2012.  FINDINGS: No acute bony or joint abnormality is identified. The patient is status post amputation of the distal phalanx of the great toe. Mild midfoot degenerative change is seen. There is some dorsal calcaneal spurring. Soft tissues are  unremarkable.  IMPRESSION: No acute finding.  Stable compared to prior exam.   Electronically Signed   By: Drusilla Kannerhomas  Dalessio M.D.   On: 07/24/2013 15:32     MDM   1. Foot pain, right    X-ray negative. Patient placed in a postop shoe and he will go back to his orthopedist for evaluation of possible metatarsal stress fracture.    Graylon GoodZachary H Sanav Remer, PA-C 07/24/13 1550

## 2013-07-24 NOTE — Discharge Instructions (Signed)

## 2013-07-24 NOTE — ED Notes (Signed)
Patient complains of right foot pain since Monday, bottom of foot, denies injury

## 2013-07-26 NOTE — ED Provider Notes (Signed)
Medical screening examination/treatment/procedure(s) were performed by non-physician practitioner and as supervising physician I was immediately available for consultation/collaboration.  Samit Sylve, M.D.   Mykeria Garman C Chanda Laperle, MD 07/26/13 0802 

## 2013-07-27 ENCOUNTER — Ambulatory Visit: Payer: BC Managed Care – PPO | Admitting: Podiatry

## 2013-08-24 ENCOUNTER — Other Ambulatory Visit (INDEPENDENT_AMBULATORY_CARE_PROVIDER_SITE_OTHER): Payer: BC Managed Care – PPO

## 2013-08-24 DIAGNOSIS — Z0000 Encounter for general adult medical examination without abnormal findings: Secondary | ICD-10-CM

## 2013-08-24 LAB — LIPID PANEL
Cholesterol: 127 mg/dL (ref 0–200)
HDL: 26.6 mg/dL — AB (ref >39.00)
LDL Cholesterol: 75 mg/dL (ref 0–99)
NonHDL: 100.4
TRIGLYCERIDES: 129 mg/dL (ref 0.0–149.0)
Total CHOL/HDL Ratio: 5
VLDL: 25.8 mg/dL (ref 0.0–40.0)

## 2013-08-24 LAB — BASIC METABOLIC PANEL
BUN: 11 mg/dL (ref 6–23)
CALCIUM: 8.6 mg/dL (ref 8.4–10.5)
CHLORIDE: 103 meq/L (ref 96–112)
CO2: 26 mEq/L (ref 19–32)
CREATININE: 0.9 mg/dL (ref 0.4–1.5)
GFR: 104.16 mL/min (ref >60.00)
Glucose, Bld: 113 mg/dL — ABNORMAL HIGH (ref 70–99)
Potassium: 4 mEq/L (ref 3.5–5.1)
SODIUM: 136 meq/L (ref 135–145)

## 2013-08-24 LAB — CBC WITH DIFFERENTIAL/PLATELET
BASOS PCT: 0.3 % (ref 0.0–3.0)
Basophils Absolute: 0 10*3/uL (ref 0.0–0.1)
EOS PCT: 1.5 % (ref 0.0–5.0)
Eosinophils Absolute: 0.1 10*3/uL (ref 0.0–0.7)
HCT: 40.6 % (ref 39.0–52.0)
HEMOGLOBIN: 13.5 g/dL (ref 13.0–17.0)
LYMPHS PCT: 18.3 % (ref 12.0–46.0)
Lymphs Abs: 1.5 10*3/uL (ref 0.7–4.0)
MCHC: 33.3 g/dL (ref 30.0–36.0)
MCV: 84.8 fl (ref 78.0–100.0)
Monocytes Absolute: 0.5 10*3/uL (ref 0.1–1.0)
Monocytes Relative: 6 % (ref 3.0–12.0)
NEUTROS PCT: 73.9 % (ref 43.0–77.0)
Neutro Abs: 5.9 10*3/uL (ref 1.4–7.7)
Platelets: 215 10*3/uL (ref 150.0–400.0)
RBC: 4.79 Mil/uL (ref 4.22–5.81)
RDW: 15.6 % — ABNORMAL HIGH (ref 11.5–15.5)
WBC: 8 10*3/uL (ref 4.0–10.5)

## 2013-08-24 LAB — POCT URINALYSIS DIPSTICK
BILIRUBIN UA: NEGATIVE
Blood, UA: NEGATIVE
GLUCOSE UA: NEGATIVE
Ketones, UA: NEGATIVE
LEUKOCYTES UA: NEGATIVE
NITRITE UA: NEGATIVE
Spec Grav, UA: 1.015
UROBILINOGEN UA: 0.2
pH, UA: 7.5

## 2013-08-24 LAB — HEPATIC FUNCTION PANEL
ALBUMIN: 3.6 g/dL (ref 3.5–5.2)
ALK PHOS: 71 U/L (ref 39–117)
ALT: 37 U/L (ref 0–53)
AST: 28 U/L (ref 0–37)
BILIRUBIN DIRECT: 0.1 mg/dL (ref 0.0–0.3)
TOTAL PROTEIN: 7.2 g/dL (ref 6.0–8.3)
Total Bilirubin: 0.9 mg/dL (ref 0.2–1.2)

## 2013-08-24 LAB — TSH: TSH: 2.54 u[IU]/mL (ref 0.35–4.50)

## 2013-08-30 ENCOUNTER — Encounter: Payer: Self-pay | Admitting: Family Medicine

## 2013-08-30 ENCOUNTER — Ambulatory Visit (INDEPENDENT_AMBULATORY_CARE_PROVIDER_SITE_OTHER): Payer: BC Managed Care – PPO | Admitting: Family Medicine

## 2013-08-30 VITALS — BP 120/62 | Temp 98.8°F | Ht 72.0 in | Wt 397.0 lb

## 2013-08-30 DIAGNOSIS — Z0000 Encounter for general adult medical examination without abnormal findings: Secondary | ICD-10-CM

## 2013-08-30 MED ORDER — LISINOPRIL-HYDROCHLOROTHIAZIDE 20-25 MG PO TABS: 1.0000 | ORAL_TABLET | Freq: Every day | ORAL | Status: DC

## 2013-08-30 MED ORDER — ALLOPURINOL 300 MG PO TABS: 300.0000 mg | ORAL_TABLET | Freq: Every morning | ORAL | Status: DC

## 2013-08-30 MED ORDER — NAPROXEN 500 MG PO TABS: 500.0000 mg | ORAL_TABLET | Freq: Two times a day (BID) | ORAL | Status: DC

## 2013-08-30 MED ORDER — DILTIAZEM HCL 90 MG PO TABS: 90.0000 mg | ORAL_TABLET | Freq: Every day | ORAL | Status: DC

## 2013-08-30 NOTE — Progress Notes (Signed)
Pre visit review using our clinic review tool, if applicable. No additional management support is needed unless otherwise documented below in the visit note. 

## 2013-08-30 NOTE — Progress Notes (Signed)
   Subjective:    Patient ID: Danny Bauer, male    DOB: 12/25/1980, 33 y.o.   MRN: [id number]  HPI 33 yr old male for a cpx. He feels well except for a 2 week hx of some intermittent soreness across the epigastrium. He can cause this to hurt if he twists his body but there is no SOB or nausea or heartburn. He has a very physical job.    Review of Systems  Constitutional: Negative.   HENT: Negative.   Eyes: Negative.   Respiratory: Negative.   Cardiovascular: Negative.   Gastrointestinal: Positive for abdominal pain. Negative for nausea, vomiting, diarrhea, constipation, blood in stool, abdominal distention, anal bleeding and rectal pain.  Genitourinary: Negative.   Musculoskeletal: Negative.   Skin: Negative.   Neurological: Negative.   Psychiatric/Behavioral: Negative.        Objective:   Physical Exam  Constitutional: He is oriented to person, place, and time. He appears well-developed and well-nourished. No distress.  HENT:  Head: Normocephalic and atraumatic.  Right Ear: External ear normal.  Left Ear: External ear normal.  Nose: Nose normal.  Mouth/Throat: Oropharynx is clear and moist. No oropharyngeal exudate.  Eyes: Conjunctivae and EOM are normal. Pupils are equal, round, and reactive to light. Right eye exhibits no discharge. Left eye exhibits no discharge. No scleral icterus.  Neck: Neck supple. No JVD present. No tracheal deviation present. No thyromegaly present.  Cardiovascular: Normal rate, regular rhythm, normal heart sounds and intact distal pulses.  Exam reveals no gallop and no friction rub.   No murmur heard. Pulmonary/Chest: Effort normal and breath sounds normal. No respiratory distress. He has no wheezes. He has no rales. He exhibits no tenderness.  Abdominal: Soft. Bowel sounds are normal. He exhibits no distension and no mass. There is no tenderness. There is no rebound and no guarding.  Genitourinary: Rectum normal, prostate normal and penis normal.  Guaiac negative stool. No penile tenderness.  Musculoskeletal: Normal range of motion. He exhibits no edema and no tenderness.  Lymphadenopathy:    He has no cervical adenopathy.  Neurological: He is alert and oriented to person, place, and time. He has normal reflexes. No cranial nerve deficit. He exhibits normal muscle tone. Coordination normal.  Skin: Skin is warm and dry. No rash noted. He is not diaphoretic. No erythema. No pallor.  Psychiatric: He has a normal mood and affect. His behavior is normal. Judgment and thought content normal.          Assessment & Plan:  Well exam. We again discussed his need to lose weight. His fasting glucose is slowly rising and he is at risk of developing diabetes. We discussed this as well and how this relates to his obesity.

## 2014-05-30 ENCOUNTER — Ambulatory Visit (INDEPENDENT_AMBULATORY_CARE_PROVIDER_SITE_OTHER): Payer: Worker's Compensation | Admitting: Family Medicine

## 2014-05-30 ENCOUNTER — Ambulatory Visit: Payer: Worker's Compensation

## 2014-05-30 VITALS — BP 126/72 | HR 90 | Temp 98.8°F | Resp 18 | Ht 73.0 in | Wt 393.4 lb

## 2014-05-30 DIAGNOSIS — Y990 Civilian activity done for income or pay: Secondary | ICD-10-CM

## 2014-05-30 DIAGNOSIS — M25532 Pain in left wrist: Secondary | ICD-10-CM

## 2014-05-30 DIAGNOSIS — S66912A Strain of unspecified muscle, fascia and tendon at wrist and hand level, left hand, initial encounter: Secondary | ICD-10-CM

## 2014-05-30 DIAGNOSIS — M109 Gout, unspecified: Secondary | ICD-10-CM | POA: Insufficient documentation

## 2014-05-30 MED ORDER — DICLOFENAC SODIUM 75 MG PO TBEC: 75.0000 mg | DELAYED_RELEASE_TABLET | Freq: Two times a day (BID) | ORAL | Status: DC

## 2014-05-30 NOTE — Patient Instructions (Signed)
Ice and rest.

## 2014-05-30 NOTE — Progress Notes (Signed)
   Subjective:    Patient ID: Danny MarkerBrandon L Wandler, male    DOB: 1980/12/30, 34 y.o.   MRN: [id number]  HPI Pt presents to clinic with left wrist pain that started today while he was working. 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  He noticed as the time went by today the pain got worse.  The entire wrist urts but more on the flexor surface.  He has never hurt this wrist before.  He is having no hand or elbow pain.  No paresthesias or weakness.  Right handed No home treatments of ice of NSAIDs  Review of Systems  Musculoskeletal: Negative for joint swelling.  Neurological: Negative for weakness.       Objective:   Physical Exam  Constitutional: He is oriented to person, place, and time. He appears well-developed and well-nourished.  BP 126/72 mmHg  Pulse 90  Temp(Src) 98.8 F (37.1 C) (Oral)  Resp 18  Ht 6\' 1"  (1.854 m)  Wt 393 lb 6.4 oz (178.445 kg)  BMI 51.91 kg/m2  SpO2 98%   HENT:  Head: Normocephalic and atraumatic.  Right Ear: External ear normal.  Left Ear: External ear normal.  Pulmonary/Chest: Effort normal.  Musculoskeletal:       Left wrist: He exhibits normal range of motion, no tenderness and no bony tenderness.  Pain with wrist extension at the flexor surface that increases with resistance.  No pain with passive flexion but some pain with resistance with flexion on the flexor surface. No decrease in hand grip strength and no pain with finger movement or resistance.  Neurological: He is alert and oriented to person, place, and time.  Skin: Skin is warm and dry.  Psychiatric: He has a normal mood and affect. His behavior is normal. Judgment and thought content normal.   [facility] reading (PRIMARY) by  Dr. Conley RollsLe. Chronic changes distal radius.  No acute findings.     Assessment & Plan:  Work related injury  Left wrist pain - Plan: DG Wrist Complete Left  Wrist strain, left, initial encounter - Plan: diclofenac (VOLTAREN) 75 MG EC  tablet  This is an overuse injury that should decrease with decrease use.  Will place the patient is a volar wrist splint and he will ice to area.  We will decrease his weight limits at work to allow the wrist to rest over the next week.  He will RTC in 1 week for a recheck.  Benny LennertSarah Shamarr Faucett PA-C  [facility] 05/30/2014 8:10 PM

## 2014-06-06 ENCOUNTER — Ambulatory Visit (INDEPENDENT_AMBULATORY_CARE_PROVIDER_SITE_OTHER): Payer: Worker's Compensation | Admitting: Family Medicine

## 2014-06-06 VITALS — BP 122/58 | HR 78 | Temp 97.9°F | Resp 15 | Ht 74.0 in | Wt >= 6400 oz

## 2014-06-06 DIAGNOSIS — S66912D Strain of unspecified muscle, fascia and tendon at wrist and hand level, left hand, subsequent encounter: Secondary | ICD-10-CM | POA: Diagnosis not present

## 2014-06-06 DIAGNOSIS — Y990 Civilian activity done for income or pay: Secondary | ICD-10-CM

## 2014-06-06 DIAGNOSIS — M25532 Pain in left wrist: Secondary | ICD-10-CM | POA: Diagnosis not present

## 2014-06-06 NOTE — Progress Notes (Signed)
Chief Complaint:  Chief Complaint  Patient presents with  . Follow-up    Left wrist pain, workers comp    HPI: Danny Bauer is a 34 y.o. male who is here for  reevaluation of left wrist sprains strain which occurred at work. He is about 50% improved. The diclofenac is helping him with his pain and he currently does not have any pain.  He wears his wrist brace during work and not at night. He states that they have not lifted much at work. He is driving a forklift. Right-hand dominant. Denies numbness or tingling. X-ray below was negative  CLINICAL DATA: Acute onset of left wrist pain. Initial encounter.  EXAM: LEFT WRIST - COMPLETE 3+ VIEW  COMPARISON: None.  FINDINGS: There is no evidence of fracture or dislocation. The carpal rows are intact, and demonstrate normal alignment. The joint spaces are preserved.  No significant soft tissue abnormalities are seen.  IMPRESSION: No evidence of fracture or dislocation.   Electronically Signed  By: Roanna RaiderJeffery Chang M.D.  On: 05/30/2014 21:15  Past Medical History  Diagnosis Date  . Hypertension   . GERD (gastroesophageal reflux disease)   . Sleep apnea, obstructive     sees Dr. Suzanna ObeyJohn Byers    Past Surgical History  Procedure Laterality Date  . 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 Nose surgery  2009    straighten nasal passage    History   Social History  . Marital Status: Married    Spouse Name: N/A  . Number of Children: N/A  . Years of Education: N/A   Social History Main Topics  . Smoking status: Never Smoker   . Smokeless tobacco: Never Used  . Alcohol Use: No  . Drug Use: No  . Sexual Activity: Not on file   Other Topics Concern  . None   Social History Narrative   Family History  Problem Relation Age of Onset  . Heart failure Mother   . Heart disease Mother   . Heart attack Father   . Heart disease Father    Allergies  Allergen Reactions  . Bee Venom Anaphylaxis  .  Codeine Diarrhea  . Hydrocodone-Acetaminophen Diarrhea  . Prednisone Diarrhea   Prior to Admission medications   Medication Sig Start Date End Date Taking? Authorizing Provider  allopurinol (ZYLOPRIM) 300 MG tablet Take 1 tablet (300 mg total) by mouth every morning. 08/30/13  Yes Nelwyn SalisburyStephen A Fry, MD  diclofenac (VOLTAREN) 75 MG EC tablet Take 1 tablet (75 mg total) by mouth 2 (two) times daily. 05/30/14  Yes Morrell RiddleSarah L Weber, PA-C  diltiazem (CARDIZEM) 90 MG tablet Take 1 tablet (90 mg total) by mouth daily. 08/30/13  Yes Nelwyn SalisburyStephen A Fry, MD  EPINEPHrine (EPIPEN 2-PAK) 0.3 mg/0.3 mL SOAJ Inject 0.3 mLs (0.3 mg total) into the muscle once. 08/04/12  Yes Nelwyn SalisburyStephen A Fry, MD  lisinopril-hydrochlorothiazide (PRINZIDE,ZESTORETIC) 20-25 MG per tablet Take 1 tablet by mouth daily. 08/30/13  Yes Nelwyn SalisburyStephen A Fry, MD  naproxen (NAPROSYN) 500 MG tablet Take 1 tablet (500 mg total) by mouth 2 (two) times daily. 08/30/13  Yes Nelwyn SalisburyStephen A Fry, MD  omeprazole (PRILOSEC) 20 MG capsule Take 20 mg by mouth every morning.    Yes Historical Provider, MD     ROS: The patient denies fevers, chills, night sweats, unintentional weight loss, chest pain, palpitations, wheezing, dyspnea on exertion, nausea, vomiting, abdominal pain, dysuria, hematuria, melena, numbness, weakness, or tingling.  All other systems have been reviewed and were otherwise negative with the exception of those mentioned in the HPI and as above.    PHYSICAL EXAM: Filed Vitals:   06/06/14 0855  BP: 122/58  Pulse: 78  Temp: 97.9 F (36.6 C)  Resp: 15   Filed Vitals:   06/06/14 0855  Height:  (1.88 m)  Weight: 411 lb 9.6 oz (186.701 kg)   Body mass index is 52.82 kg/(m^2).  General: Alert, no acute distress, her bili obese HEENT:  Normocephalic, atraumatic, oropharynx patent. EOMI, PERRLA Cardiovascular:  Regular rate and rhythm, no rubs murmurs or gallops. No adial pulse intact. No pedal edema.  Respiratory: Clear to auscultation bilaterally.   No wheezes, rales, or rhonchi.  No cyanosis, no use of accessory musculature GI: No organomegaly, abdomen is soft and non-tender, positive bowel sounds.  No masses. Skin: No rashes. Neurologic: Facial musculature symmetric. Psychiatric: Patient is appropriate throughout our interaction. Lymphatic: No cervical lymphadenopathy Musculoskeletal: Gait intact. Left  wrist no deformities, sensation intact, radial pulse intact, 5 out of 5 strength, there is full range of motion but he has pain with flexion    LABS: Results for orders placed or performed in visit on 08/24/13  Basic metabolic panel  Result Value Ref Range   Sodium 136 135 - 145 mEq/L   Potassium 4.0 3.5 - 5.1 mEq/L   Chloride 103 96 - 112 mEq/L   CO2 26 19 - 32 mEq/L   Glucose, Bld 113 (H) 70 - 99 mg/dL   BUN 11 6 - 23 mg/dL   Creatinine, Ser 0.9 0.4 - 1.5 mg/dL   Calcium 8.6 8.4 - 16.1 mg/dL   GFR 096.04 >54.09 mL/min  CBC with Differential  Result Value Ref Range   WBC 8.0 4.0 - 10.5 K/uL   RBC 4.79 4.22 - 5.81 Mil/uL   Hemoglobin 13.5 13.0 - 17.0 g/dL   HCT 81.1 91.4 - 78.2 %   MCV 84.8 78.0 - 100.0 fl   MCHC 33.3 30.0 - 36.0 g/dL   RDW 95.6 (H) 21.3 - 08.6 %   Platelets 215.0 150.0 - 400.0 K/uL   Neutrophils Relative % 73.9 43.0 - 77.0 %   Lymphocytes Relative 18.3 12.0 - 46.0 %   Monocytes Relative 6.0 3.0 - 12.0 %   Eosinophils Relative 1.5 0.0 - 5.0 %   Basophils Relative 0.3 0.0 - 3.0 %   Neutro Abs 5.9 1.4 - 7.7 K/uL   Lymphs Abs 1.5 0.7 - 4.0 K/uL   Monocytes Absolute 0.5 0.1 - 1.0 K/uL   Eosinophils Absolute 0.1 0.0 - 0.7 K/uL   Basophils Absolute 0.0 0.0 - 0.1 K/uL  Hepatic function panel  Result Value Ref Range   Total Bilirubin 0.9 0.2 - 1.2 mg/dL   Bilirubin, Direct 0.1 0.0 - 0.3 mg/dL   Alkaline Phosphatase 71 39 - 117 U/L   AST 28 0 - 37 U/L   ALT 37 0 - 53 U/L   Total Protein 7.2 6.0 - 8.3 g/dL   Albumin 3.6 3.5 - 5.2 g/dL  TSH  Result Value Ref Range   TSH 2.54 0.35 - 4.50 uIU/mL    Lipid panel  Result Value Ref Range   Cholesterol 127 0 - 200 mg/dL   Triglycerides 578.4 0.0 - 149.0 mg/dL   HDL 69.62 (L) >95.28 mg/dL   VLDL 41.3 0.0 - 24.4 mg/dL   LDL Cholesterol 75 0 - 99 mg/dL   Total CHOL/HDL Ratio 5  NonHDL 100.40   POCT urinalysis dipstick  Result Value Ref Range   Color, UA yellow    Clarity, UA clear    Glucose, UA n    Bilirubin, UA n    Ketones, UA n    Spec Grav, UA 1.015    Blood, UA n    pH, UA 7.5    Protein, UA trace    Urobilinogen, UA 0.2    Nitrite, UA n    Leukocytes, UA Negative      EKG/XRAY:   Primary read interpreted by Dr. Conley Rolls at [facility].   ASSESSMENT/PLAN: Encounter Diagnoses  Name Primary?  . Left wrist pain Yes  . Work related injury   . Wrist strain, left, subsequent encounter    Left wrist sprain/strain about 50% improved. Continue with wrist brace and also diclofenac when necessary for pain. Start range of motion exercises for wrist, he may take his wrist brace off if he feels that there is no pain associated with lifting based on the restrictions I gave him. If he has pain when he needs to continue wearing the wrist brace Follow-up in one week    Gross sideeffects, risk and benefits, and alternatives of medications d/w patient. Patient is aware that all medications have potential sideeffects and we are unable to predict every sideeffect or drug-drug interaction that may occur.  LE, THAO PHUONG, DO 06/06/2014 9:25 AM

## 2014-06-13 ENCOUNTER — Ambulatory Visit (INDEPENDENT_AMBULATORY_CARE_PROVIDER_SITE_OTHER): Payer: Worker's Compensation | Admitting: Family Medicine

## 2014-06-13 VITALS — BP 132/60 | HR 86 | Temp 99.0°F | Resp 17 | Ht 73.25 in | Wt 395.4 lb

## 2014-06-13 DIAGNOSIS — S66912D Strain of unspecified muscle, fascia and tendon at wrist and hand level, left hand, subsequent encounter: Secondary | ICD-10-CM

## 2014-06-13 NOTE — Patient Instructions (Signed)
1. Return to regular duty. 2. Return only if wrist pain returns.

## 2014-06-13 NOTE — Progress Notes (Signed)
   Subjective:    Patient ID: Suanne MarkerBrandon L Kirker, male    DOB: 1980/04/11, 34 y.o.   MRN: [id number]  06/13/2014  Follow-up   HPI This 34 y.o. male presents for one week follow-up of Workman's compensation wrist injury. Initial injury 06/02/14 at work.   Evaluated by Dr. Conley RollsLe on 06/06/14; advised to continue wrist splint and to perform range of motion exercises on wrist; placed on light duty; has had a 30 pound weight limit.  Injury two weeks ago.  Pain is much improved from last week.  95% improved.  Taking wrist splint off at home at night.  Performing range of motion exercises daily.  No pain currently.  0/10.  Intermittent pain at this pain.  No n/t.  No weakness. R hand dominant.  Taking Diclofenac twice daily.  No icing at this point.  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  No lifting over 30 pounds for past week.  With regular duty, has product that is 80-90 pounds.  Ready to return to regular duty.    Review of Systems  Constitutional: Negative for fever, chills, diaphoresis and fatigue.  Musculoskeletal: Negative for myalgias, joint swelling, neck pain and neck stiffness.  Skin: Negative for rash and wound.  Neurological: Negative for weakness and numbness.       Objective:    BP 132/60 mmHg  Pulse 86  Temp(Src) 99 F (37.2 C) (Oral)  Resp 17  Ht 6' 1.25" (1.861 m)  Wt 395 lb 6.4 oz (179.352 kg)  BMI 51.79 kg/m2  SpO2 98% Physical Exam  Constitutional: He appears well-developed and well-nourished. No distress.  obese  HENT:  Head: Normocephalic and atraumatic.  Eyes: Conjunctivae are normal. Pupils are equal, round, and reactive to light.  Musculoskeletal:       Right shoulder: Normal. He exhibits normal range of motion, no tenderness, no bony tenderness, no pain, normal pulse and normal strength.       Left shoulder: He exhibits normal range of motion, no tenderness, no bony tenderness, no pain, normal pulse and normal strength.       Left elbow: Normal. He exhibits  normal range of motion and no swelling. No tenderness found. No radial head, no medial epicondyle, no lateral epicondyle and no olecranon process tenderness noted.       Left wrist: Normal. He exhibits normal range of motion, no tenderness, no bony tenderness and no swelling.       Cervical back: Normal. He exhibits normal range of motion, no tenderness, no bony tenderness, no pain and no spasm.       Left hand: Normal. He exhibits normal range of motion, no tenderness, no bony tenderness and normal capillary refill. Normal sensation noted. Normal strength noted.  Skin: He is not diaphoretic.        Assessment & Plan:   1. Wrist strain, left, subsequent encounter    -Improved/resolved. -Return to regular duty; no limitations. -RTC PRN or recurrent wrist pain.   No orders of the defined types were placed in this encounter.    No Follow-up on file.     Datrell Dunton Paulita FujitaMartin Shaindy Reader, M.D. [facility]  [place] [address] [place], [place]  [id number] [phone] phone [phone] fax

## 2014-12-19 ENCOUNTER — Ambulatory Visit (INDEPENDENT_AMBULATORY_CARE_PROVIDER_SITE_OTHER): Payer: Self-pay | Admitting: Internal Medicine

## 2014-12-19 ENCOUNTER — Encounter: Payer: Self-pay | Admitting: Internal Medicine

## 2014-12-19 VITALS — BP 136/76 | Temp 98.6°F | Wt >= 6400 oz

## 2014-12-19 DIAGNOSIS — H6122 Impacted cerumen, left ear: Secondary | ICD-10-CM

## 2014-12-19 DIAGNOSIS — H938X2 Other specified disorders of left ear: Secondary | ICD-10-CM

## 2014-12-19 NOTE — Progress Notes (Signed)
Pre visit review using our clinic review tool, if applicable. No additional management support is needed unless otherwise documented below in the visit note.   Chief Complaint  Patient presents with  . Cerumen Impaction    Left Ear X 1 week    HPI: Patient Danny Bauer  comes in today for SDA for  new problem evaluation. PCP NA  Left ear feels clogged for ober a week  sometimtes uses peroxide and irrigation bulb but not this bad fora  Few years .  No uri ear pain of sinus infection No fever  ROS: See pertinent positives and negatives per HPI.  Past Medical History  Diagnosis Date  . Hypertension   . GERD (gastroesophageal reflux disease)   . Sleep apnea, obstructive     sees Dr. Suzanna Obey     Family History  Problem Relation Age of Onset  . Heart failure Mother   . Heart disease Mother   . Heart attack Father   . Heart disease Father     Social History   Social History  . Marital Status: Married    Spouse Name: N/A  . Number of Children: N/A  . Years of Education: N/A   Social History Main Topics  . Smoking status: Never Smoker   . Smokeless tobacco: Never Used  . Alcohol Use: No  . Drug Use: No  . Sexual Activity: Not Asked   Other Topics Concern  . None   Social History Narrative    Outpatient Prescriptions Prior to Visit  Medication Sig Dispense Refill  . allopurinol (ZYLOPRIM) 300 MG tablet Take 1 tablet (300 mg total) by mouth every morning. 90 tablet 3  . diclofenac (VOLTAREN) 75 MG EC tablet Take 1 tablet (75 mg total) by mouth 2 (two) times daily. 30 tablet 0  . diltiazem (CARDIZEM) 90 MG tablet Take 1 tablet (90 mg total) by mouth daily. 90 tablet 3  . EPINEPHrine (EPIPEN 2-PAK) 0.3 mg/0.3 mL SOAJ Inject 0.3 mLs (0.3 mg total) into the muscle once. 1 Device 5  . lisinopril-hydrochlorothiazide (PRINZIDE,ZESTORETIC) 20-25 MG per tablet Take 1 tablet by mouth daily. 90 tablet 3  . naproxen (NAPROSYN) 500 MG tablet Take 1 tablet (500 mg total) by  mouth 2 (two) times daily. 180 tablet 3  . omeprazole (PRILOSEC) 20 MG capsule Take 20 mg by mouth every morning.      No facility-administered medications prior to visit.     EXAM:  BP 136/76 mmHg  Temp(Src) 98.6 F (37 C) (Oral)  Wt 406 lb 6.4 oz (184.342 kg)  Body mass index is 53.23 kg/(m^2).  GENERAL: vitals reviewed and listed above, alert, oriented, appears well hydrated and in no acute distress looks slightly congested  HEENT: atraumatic, conjunctiva  clear, no obvious abnormalities on inspection of external nose and ears  Right eat tm nl left eac wax after irrigation about 20% left deep  light reflex intact tm not fully visualized   No  swellingOP : no lesion edema or exudate  NECK: no obvious masses on inspection palpation  nt PSYCH: pleasant and cooperative, no obvious depression or anxiety After irrigation  Tm intact still small amount wax in eac   ASSESSMENT AND PLAN:  Discussed the following assessment and plan:  Clogged ear, left  Cerumen impaction, left Fu if  persistent or progressive   And sx continue water in ear    No alarm features at this time -Patient advised to return or notify health care team  if symptoms worsen ,persist or new concerns arise.  Patient Instructions     Cerumen Impaction The structures of the external ear canal secrete a waxy substance known as cerumen. Excess cerumen can build up in the ear canal, causing a condition known as cerumen impaction. Cerumen impaction can cause ear pain and disrupt the function of the ear. The rate of cerumen production differs for each individual. In certain individuals, the configuration of the ear canal may decrease his or her ability to naturally remove cerumen. CAUSES Cerumen impaction is caused by excessive cerumen production or buildup. RISK FACTORS  Frequent use of swabs to clean ears.  Having narrow ear canals.  Having eczema.  Being dehydrated. SIGNS AND SYMPTOMS  Diminished  hearing.  Ear drainage.  Ear pain.  Ear itch. TREATMENT Treatment may involve:  Over-the-counter or prescription ear drops to soften the cerumen.  Removal of cerumen by a health care provider. This may be done with:  Irrigation with warm water. This is the most common method of removal.  Ear curettes and other instruments.  Surgery. This may be done in severe cases. HOME CARE INSTRUCTIONS  Take medicines only as directed by your health care provider.  Do not insert objects into the ear with the intent of cleaning the ear. PREVENTION  Do not insert objects into the ear, even with the intent of cleaning the ear. Removing cerumen as a part of normal hygiene is not necessary, and the use of swabs in the ear canal is not recommended.  Drink enough water to keep your urine clear or pale yellow.  Control your eczema if you have it. SEEK MEDICAL CARE IF:  You develop ear pain.  You develop bleeding from the ear.  The cerumen does not clear after you use ear drops as directed.   This information is not intended to replace advice given to you by your health care provider. Make sure you discuss any questions you have with your health care provider.   Document Released: 02/08/2004 Document Revised: 01/21/2014 Document Reviewed: 08/17/2014 Elsevier Interactive Patient Education 2016 [employer]      [place] MeadowsWanda K. Panosh M.D.

## 2014-12-19 NOTE — Patient Instructions (Signed)
Cerumen Impaction The structures of the external ear canal secrete a waxy substance known as cerumen. Excess cerumen can build up in the ear canal, causing a condition known as cerumen impaction. Cerumen impaction can cause ear pain and disrupt the function of the ear. The rate of cerumen production differs for each individual. In certain individuals, the configuration of the ear canal may decrease his or her ability to naturally remove cerumen. CAUSES Cerumen impaction is caused by excessive cerumen production or buildup. RISK FACTORS  Frequent use of swabs to clean ears.  Having narrow ear canals.  Having eczema.  Being dehydrated. SIGNS AND SYMPTOMS  Diminished hearing.  Ear drainage.  Ear pain.  Ear itch. TREATMENT Treatment may involve:  Over-the-counter or prescription ear drops to soften the cerumen.  Removal of cerumen by a health care Joaquim Tolen. This may be done with:  Irrigation with warm water. This is the most common method of removal.  Ear curettes and other instruments.  Surgery. This may be done in severe cases. HOME CARE INSTRUCTIONS  Take medicines only as directed by your health care Layloni Fahrner.  Do not insert objects into the ear with the intent of cleaning the ear. PREVENTION  Do not insert objects into the ear, even with the intent of cleaning the ear. Removing cerumen as a part of normal hygiene is not necessary, and the use of swabs in the ear canal is not recommended.  Drink enough water to keep your urine clear or pale yellow.  Control your eczema if you have it. SEEK MEDICAL CARE IF:  You develop ear pain.  You develop bleeding from the ear.  The cerumen does not clear after you use ear drops as directed.   This information is not intended to replace advice given to you by your health care Vennie Waymire. Make sure you discuss any questions you have with your health care Syliva Mee.   Document Released: 02/08/2004 Document Revised: 01/21/2014  Document Reviewed: 08/17/2014 Elsevier Interactive Patient Education 2016 Elsevier Inc.  

## 2015-01-10 ENCOUNTER — Other Ambulatory Visit (INDEPENDENT_AMBULATORY_CARE_PROVIDER_SITE_OTHER): Payer: Self-pay

## 2015-01-10 DIAGNOSIS — R7989 Other specified abnormal findings of blood chemistry: Secondary | ICD-10-CM

## 2015-01-10 DIAGNOSIS — Z0000 Encounter for general adult medical examination without abnormal findings: Secondary | ICD-10-CM

## 2015-01-10 LAB — CBC WITH DIFFERENTIAL/PLATELET
BASOS ABS: 0 10*3/uL (ref 0.0–0.1)
BASOS PCT: 0.5 % (ref 0.0–3.0)
EOS PCT: 2.4 % (ref 0.0–5.0)
Eosinophils Absolute: 0.2 10*3/uL (ref 0.0–0.7)
HEMATOCRIT: 42.9 % (ref 39.0–52.0)
Hemoglobin: 14.2 g/dL (ref 13.0–17.0)
LYMPHS ABS: 1.4 10*3/uL (ref 0.7–4.0)
LYMPHS PCT: 19.4 % (ref 12.0–46.0)
MCHC: 33.1 g/dL (ref 30.0–36.0)
MCV: 83 fl (ref 78.0–100.0)
MONOS PCT: 5.5 % (ref 3.0–12.0)
Monocytes Absolute: 0.4 10*3/uL (ref 0.1–1.0)
NEUTROS ABS: 5.1 10*3/uL (ref 1.4–7.7)
NEUTROS PCT: 72.2 % (ref 43.0–77.0)
PLATELETS: 213 10*3/uL (ref 150.0–400.0)
RBC: 5.17 Mil/uL (ref 4.22–5.81)
RDW: 15.1 % (ref 11.5–15.5)
WBC: 7.1 10*3/uL (ref 4.0–10.5)

## 2015-01-10 LAB — POCT URINALYSIS DIPSTICK
BILIRUBIN UA: NEGATIVE
Blood, UA: NEGATIVE
KETONES UA: NEGATIVE
LEUKOCYTES UA: NEGATIVE
NITRITE UA: NEGATIVE
PH UA: 5.5
PROTEIN UA: NEGATIVE
Spec Grav, UA: 1.025
Urobilinogen, UA: 0.2

## 2015-01-10 LAB — LIPID PANEL
CHOLESTEROL: 129 mg/dL (ref 0–200)
HDL: 29.1 mg/dL — AB (ref >39.00)
NonHDL: 99.7
TRIGLYCERIDES: 205 mg/dL — AB (ref 0.0–149.0)
Total CHOL/HDL Ratio: 4
VLDL: 41 mg/dL — AB (ref 0.0–40.0)

## 2015-01-10 LAB — BASIC METABOLIC PANEL
BUN: 12 mg/dL (ref 6–23)
CALCIUM: 9 mg/dL (ref 8.4–10.5)
CHLORIDE: 99 meq/L (ref 96–112)
CO2: 26 meq/L (ref 19–32)
Creatinine, Ser: 0.75 mg/dL (ref 0.40–1.50)
GFR: 125.88 mL/min (ref >60.00)
GLUCOSE: 190 mg/dL — AB (ref 70–99)
POTASSIUM: 3.9 meq/L (ref 3.5–5.1)
SODIUM: 135 meq/L (ref 135–145)

## 2015-01-10 LAB — LDL CHOLESTEROL, DIRECT: LDL DIRECT: 80 mg/dL

## 2015-01-10 LAB — HEPATIC FUNCTION PANEL
ALBUMIN: 4 g/dL (ref 3.5–5.2)
ALT: 24 U/L (ref 0–53)
AST: 20 U/L (ref 0–37)
Alkaline Phosphatase: 90 U/L (ref 39–117)
Bilirubin, Direct: 0.1 mg/dL (ref 0.0–0.3)
TOTAL PROTEIN: 7 g/dL (ref 6.0–8.3)
Total Bilirubin: 0.6 mg/dL (ref 0.2–1.2)

## 2015-01-10 LAB — TSH: TSH: 3.04 u[IU]/mL (ref 0.35–4.50)

## 2015-01-10 LAB — MICROALBUMIN / CREATININE URINE RATIO
CREATININE, U: 172.7 mg/dL
MICROALB UR: 3.9 mg/dL — AB (ref 0.0–1.9)
MICROALB/CREAT RATIO: 2.3 mg/g (ref 0.0–30.0)

## 2015-01-10 LAB — HEMOGLOBIN A1C: Hgb A1c MFr Bld: 7.2 % — ABNORMAL HIGH (ref 4.6–6.5)

## 2015-01-17 ENCOUNTER — Ambulatory Visit (INDEPENDENT_AMBULATORY_CARE_PROVIDER_SITE_OTHER): Payer: Self-pay | Admitting: Family Medicine

## 2015-01-17 ENCOUNTER — Encounter: Payer: Self-pay | Admitting: Family Medicine

## 2015-01-17 VITALS — BP 128/78 | HR 99 | Temp 98.8°F | Ht 73.25 in | Wt >= 6400 oz

## 2015-01-17 DIAGNOSIS — Z23 Encounter for immunization: Secondary | ICD-10-CM

## 2015-01-17 DIAGNOSIS — E118 Type 2 diabetes mellitus with unspecified complications: Secondary | ICD-10-CM

## 2015-01-17 DIAGNOSIS — Z0000 Encounter for general adult medical examination without abnormal findings: Secondary | ICD-10-CM

## 2015-01-17 MED ORDER — LISINOPRIL-HYDROCHLOROTHIAZIDE 20-25 MG PO TABS: 1.0000 | ORAL_TABLET | Freq: Every day | ORAL | Status: DC

## 2015-01-17 MED ORDER — METFORMIN HCL 500 MG PO TABS: 500.0000 mg | ORAL_TABLET | Freq: Two times a day (BID) | ORAL | Status: DC

## 2015-01-17 MED ORDER — DILTIAZEM HCL 90 MG PO TABS: 90.0000 mg | ORAL_TABLET | Freq: Every day | ORAL | Status: DC

## 2015-01-17 MED ORDER — ALLOPURINOL 300 MG PO TABS: 300.0000 mg | ORAL_TABLET | Freq: Every morning | ORAL | Status: DC

## 2015-01-17 NOTE — Progress Notes (Signed)
   Subjective:    Patient ID: Danny Bauer, male    DOB: 08/05/1980, 35 y.o.   MRN: [id number]  HPI 35 yr old male for a cpx. He feels well. His BP has been stable. His recent labs show a fasting glucose of 190 with an A1c of 7.2. He remains overweight. He has had trouble affording his medications since he does not ave health insurance. 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    Review of Systems  Constitutional: Negative.   HENT: Negative.   Eyes: Negative.   Respiratory: Negative.   Cardiovascular: Negative.   Gastrointestinal: Negative.   Genitourinary: Negative.   Musculoskeletal: Negative.   Skin: Negative.   Neurological: Negative.   Psychiatric/Behavioral: Negative.        Objective:   Physical Exam  Constitutional: He is oriented to person, place, and time. No distress.  Morbidly obese   HENT:  Head: Normocephalic and atraumatic.  Right Ear: External ear normal.  Left Ear: External ear normal.  Nose: Nose normal.  Mouth/Throat: Oropharynx is clear and moist. No oropharyngeal exudate.  Eyes: Conjunctivae and EOM are normal. Pupils are equal, round, and reactive to light. Right eye exhibits no discharge. Left eye exhibits no discharge. No scleral icterus.  Neck: Neck supple. No JVD present. No tracheal deviation present. No thyromegaly present.  Cardiovascular: Normal rate, regular rhythm, normal heart sounds and intact distal pulses.  Exam reveals no gallop and no friction rub.   No murmur heard. Pulmonary/Chest: Effort normal and breath sounds normal. No respiratory distress. He has no wheezes. He has no rales. He exhibits no tenderness.  Abdominal: Soft. Bowel sounds are normal. He exhibits no distension and no mass. There is no tenderness. There is no rebound and no guarding.  Genitourinary: Rectum normal and penis normal. No penile tenderness.  Musculoskeletal: Normal range of motion. He exhibits no edema or tenderness.  Lymphadenopathy:    He has no  cervical adenopathy.  Neurological: He is alert and oriented to person, place, and time. He has normal reflexes. No cranial nerve deficit. He exhibits normal muscle tone. Coordination normal.  Skin: Skin is warm and dry. No rash noted. He is not diaphoretic. No erythema. No pallor.  Psychiatric: He has a normal mood and affect. His behavior is normal. Judgment and thought content normal.          Assessment & Plan:  Well exam. We discussed diet and exercise. His BP is stable. His gout is stable. He now has type 2 diabetes so we will start him on Metformin 500 mg bid. Recheck an A1c in 90 days .

## 2015-01-17 NOTE — Progress Notes (Signed)
Pre visit review using our clinic review tool, if applicable. No additional management support is needed unless otherwise documented below in the visit note. 

## 2015-01-27 ENCOUNTER — Telehealth: Payer: Self-pay | Admitting: Family Medicine

## 2015-01-27 NOTE — Telephone Encounter (Signed)
I spoke with pt and went over below information. 

## 2015-01-27 NOTE — Telephone Encounter (Signed)
Pt is unemployed and can not afford any of his medications. Pt would like to lose weight and would like md to recommend fat burn pill and also coupons or samples of his medications

## 2015-01-27 NOTE — Telephone Encounter (Signed)
First of all, there is no such thing as a "fat burn pill" despite what you hear in the media. Second, all his meds are generics so there are no samples and no coupons available. I suggest he contact the Medication Assistance Program at [phone] and they can find ways for him to get his meds

## 2015-02-14 ENCOUNTER — Emergency Department (HOSPITAL_COMMUNITY): Payer: Self-pay

## 2015-02-14 ENCOUNTER — Emergency Department (HOSPITAL_COMMUNITY)
Admission: EM | Admit: 2015-02-14 | Discharge: 2015-02-14 | Disposition: A | Discharge status: Home / Self Care | Payer: Self-pay | Attending: Emergency Medicine | Admitting: Emergency Medicine

## 2015-02-14 ENCOUNTER — Encounter (HOSPITAL_COMMUNITY): Payer: Self-pay | Admitting: Emergency Medicine

## 2015-02-14 DIAGNOSIS — M25561 Pain in right knee: Secondary | ICD-10-CM | POA: Insufficient documentation

## 2015-02-14 DIAGNOSIS — E119 Type 2 diabetes mellitus without complications: Secondary | ICD-10-CM | POA: Insufficient documentation

## 2015-02-14 DIAGNOSIS — Z79899 Other long term (current) drug therapy: Secondary | ICD-10-CM | POA: Insufficient documentation

## 2015-02-14 DIAGNOSIS — Z8669 Personal history of other diseases of the nervous system and sense organs: Secondary | ICD-10-CM | POA: Insufficient documentation

## 2015-02-14 DIAGNOSIS — K219 Gastro-esophageal reflux disease without esophagitis: Secondary | ICD-10-CM | POA: Insufficient documentation

## 2015-02-14 DIAGNOSIS — I10 Essential (primary) hypertension: Secondary | ICD-10-CM | POA: Insufficient documentation

## 2015-02-14 DIAGNOSIS — Z7984 Long term (current) use of oral hypoglycemic drugs: Secondary | ICD-10-CM | POA: Insufficient documentation

## 2015-02-14 MED ORDER — NAPROXEN 500 MG PO TABS: 500.0000 mg | ORAL_TABLET | Freq: Two times a day (BID) | ORAL | Status: DC

## 2015-02-14 MED ORDER — IBUPROFEN 400 MG PO TABS
800.0000 mg | ORAL_TABLET | Freq: Once | ORAL | Status: AC
  Administered 2015-02-14: 800 mg via ORAL
  Filled 2015-02-14: qty 2

## 2015-02-14 NOTE — ED Notes (Signed)
Discharge vitals complete.

## 2015-02-14 NOTE — ED Notes (Signed)
Pt sts right knee pain x 4 days; pt denies obvious injury

## 2015-02-14 NOTE — ED Provider Notes (Signed)
History  By signing my name below, I, Karle Plumber, attest that this documentation has been prepared under the direction and in the presence of Melburn Hake, [place]. Electronically Signed: Karle Plumber, ED Scribe. 02/14/2015. 3:29 PM.  Chief Complaint  Patient presents with  . Knee Pain   The history is provided by the patient and medical records. No language interpreter was used.    HPI Comments:  Danny Bauer is a 35 y.o. morbidly obese male who presents to the Emergency Department complaining of right knee pain that began a couple days ago while sitting around. He states the pain is dull, aching, throbbing and intermittent. Pt has been taking Ibuprofen with some relief of the pain but has not taken any today. He denies modifying factors. He denies numbness, tingling or weakness of the RLE, fever, chills, redness or swelling. He denies trauma, injury or fall. 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 He denies any new activity, walking or exercising.   Past Medical History  Diagnosis Date  . Hypertension   . GERD (gastroesophageal reflux disease)   . Sleep apnea, obstructive     sees Dr. Suzanna Obey   . Diabetes mellitus without complication [facility])    Past Surgical History  Procedure Laterality Date  . 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 Nose surgery  2009    straighten nasal passage    Family History  Problem Relation Age of Onset  . Heart failure Mother   . Heart disease Mother   . Heart attack Father   . Heart disease Father    Social History  Substance Use Topics  . Smoking status: Never Smoker   . Smokeless tobacco: Never Used  . Alcohol Use: No    Review of Systems  Constitutional: Negative for fever and chills.  Musculoskeletal: Positive for arthralgias. Negative for joint swelling.  Skin: Negative for color change and wound.  Neurological: Negative for weakness and numbness.    Allergies  Bee venom; Codeine; Hydrocodone-acetaminophen; and  Prednisone  Home Medications   Prior to Admission medications   Medication Sig Start Date End Date Taking? Authorizing Provider  allopurinol (ZYLOPRIM) 300 MG tablet Take 1 tablet (300 mg total) by mouth every morning. 01/17/15   Nelwyn Salisbury, MD  diltiazem (CARDIZEM) 90 MG tablet Take 1 tablet (90 mg total) by mouth daily. 01/17/15   Nelwyn Salisbury, MD  EPINEPHrine (EPIPEN 2-PAK) 0.3 mg/0.3 mL SOAJ Inject 0.3 mLs (0.3 mg total) into the muscle once. 08/04/12   Nelwyn Salisbury, MD  lisinopril-hydrochlorothiazide (PRINZIDE,ZESTORETIC) 20-25 MG tablet Take 1 tablet by mouth daily. 01/17/15   Nelwyn Salisbury, MD  metFORMIN (GLUCOPHAGE) 500 MG tablet Take 1 tablet (500 mg total) by mouth 2 (two) times daily with a meal. 01/17/15   Nelwyn Salisbury, MD  naproxen (NAPROSYN) 500 MG tablet Take 1 tablet (500 mg total) by mouth 2 (two) times daily. 02/14/15   Barrett Henle, PA-C  omeprazole (PRILOSEC) 20 MG capsule Take 20 mg by mouth every morning.     Historical Provider, MD   Triage Vitals: BP 150/98 mmHg  Pulse 84  Temp(Src) 99.5 F (37.5 C) (Oral)  Resp 18  SpO2 95% Physical Exam  Constitutional: He is oriented to person, place, and time. He appears well-developed and well-nourished.  HENT:  Head: Normocephalic and atraumatic.  Eyes: EOM are normal.  Neck: Normal range of motion.  Cardiovascular: Normal rate.   PT pulses 2+.  Pulmonary/Chest: Effort normal.  Musculoskeletal: Normal range of motion.  Mild tenderness to right lateral knee. Full ROM of right knee, hip and ankle. No ACL, MCL, PCL and LCL laxity. No swelling, erythema, warmth or deformity noted.  Neurological: He is alert and oriented to person, place, and time.  Strength 5/5 of RLE. Sensations intact. Pt is able to stand and ambulate.  Skin: Skin is warm and dry.  Psychiatric: He has a normal mood and affect. His behavior is normal.  Nursing note and vitals reviewed.   ED Course  Procedures (including critical care  time) DIAGNOSTIC STUDIES: Oxygen Saturation is 95% on RA, adequate by my interpretation.   COORDINATION OF CARE: 2:55 PM- Will X-Ray right knee. Pt verbalizes understanding and agrees to plan.  Medications  ibuprofen (ADVIL,MOTRIN) tablet 800 mg (800 mg Oral Given 02/14/15 1530)   Imaging Review Dg Knee Complete 4 Views Right  02/14/2015  CLINICAL DATA:  Right lateral knee pain for 3 days. EXAM: RIGHT KNEE - COMPLETE 4+ VIEW COMPARISON:  None. FINDINGS: Spurring of the tibial spine and marginal spurring in the medial compartment. New no significant medial or lateral compartmental articular space narrowing on standing views. Patellar marginal spurring noted. There is also lateral spurring of the patella. Slight irregularity of the anterior patella on the sunrise view suggest spurring along the quadriceps attachment. The patellofemoral articular space appears preserved. There is marginal spurring in the femoral trochlear groove. The lateral femoral trochlear groove appears somewhat shallow which might predispose to maltracking. No significant knee effusion. I do not observe a free osteochondral fragment. IMPRESSION: 1. Marginal spurring in the medial compartment and patellofemoral joint but without significant articular space narrowing. 2. The lateral femoral trochlear groove is somewhat shallow which might predispose to maltracking. 3. Spurring of the tibial spine. 4. If pain persists despite conservative therapy, MRI may be warranted for further characterization. Electronically Signed   By: Gaylyn Rong M.D.   On: 02/14/2015 15:20   I have personally reviewed and evaluated these images and lab results as part of my medical decision-making.   Filed Vitals:   02/14/15 1412 02/14/15 1612  BP: 150/98 143/88  Pulse: 84 87  Temp: 99.5 F (37.5 C) 98.4 F (36.9 C)  Resp: 18 20     MDM   Final diagnoses:  Right knee pain    Patient presents with right knee pain. Denies any recent fall,  trauma, injury. VSS. Exam revealed mild tenderness to right lateral knee, right leg otherwise neurovascularly intact, no evidence of injury or trauma. Right knee x-ray revealed spurring of medial compartment and patellofemoral joint without significant space narrowing, spurring of tibial spine. Pt is able to ambulate, no bony abnormality or deformity, no erythema or excessive heat, no evidence of cellulitis, DVT, or septic joint. Plan to discharge patient home with NSAIDs and discussed symptomatic treatment. Patient given orthopedic follow-up.  Evaluation does not show pathology requring ongoing emergent intervention or admission. Pt is hemodynamically stable and mentating appropriately. Discussed findings/results and plan with patient/guardian, who agrees with plan. All questions answered. Return precautions discussed and outpatient follow up given.    I personally performed the services described in this documentation, which was scribed in my presence. The recorded information has been reviewed and is accurate.     Satira Sark Guadal[place], [place] 02/14/15 1616  Tilden Fossa, MD 02/15/15 1236

## 2015-02-14 NOTE — ED Notes (Signed)
Pt stable, ambulatory, states understanding of discharge instructions 

## 2015-02-14 NOTE — Care Management (Signed)
ED CM met with patient in [place], patient states, he is here to obtain [employer] application. He said he told the person up front and he was told to register. CM explained that he is registered for an ED visit  Patient said he was only here to see  Case Management/Social Work to get the application,  he reports being  asked about  pain, and expressed he has had some recent intermittent pain. Patient is followed at  the [facility] he was seen 01/27/15 by Dr. Sarajane Jews.  CM contacted Vicente Serene in Registration concerning this encounter.

## 2015-02-14 NOTE — Discharge Instructions (Signed)
Take your medication as prescribed. I recommend applying ice to your knee for 15-20 minutes 3-4 times daily for pain relief. I recommend following up with the orthopedic office listed above. Call their office to schedule a follow-up appointment for further evaluation and possible need for MRI. Please return to the Emergency Department if symptoms worsen or new onset of fever, redness, swelling, numbness, tingling, weakness.

## 2015-04-17 ENCOUNTER — Other Ambulatory Visit: Payer: Self-pay

## 2016-02-12 ENCOUNTER — Telehealth: Payer: Self-pay | Admitting: Family Medicine

## 2016-02-12 NOTE — Telephone Encounter (Signed)
Pt request refill  diltiazem (CARDIZEM) 90 MG tablet  [facility]/ [address], [place], [place] (Please note new pharmacy for pt)

## 2016-02-13 NOTE — Telephone Encounter (Signed)
Call in a 90 day supply. He needs an OV soon

## 2016-02-13 NOTE — Telephone Encounter (Signed)
Can we refill this? 

## 2016-02-14 MED ORDER — DILTIAZEM HCL 90 MG PO TABS: 90.0000 mg | ORAL_TABLET | Freq: Every day | ORAL | 0 refills | Status: DC

## 2016-02-14 NOTE — Telephone Encounter (Signed)
I sent script e-scribe to [facility].  

## 2016-05-24 ENCOUNTER — Encounter: Payer: Self-pay | Admitting: Family Medicine

## 2016-05-27 ENCOUNTER — Ambulatory Visit (INDEPENDENT_AMBULATORY_CARE_PROVIDER_SITE_OTHER): Payer: BLUE CROSS/BLUE SHIELD | Admitting: Family Medicine

## 2016-05-27 ENCOUNTER — Encounter: Payer: Self-pay | Admitting: Family Medicine

## 2016-05-27 VITALS — Temp 98.1°F | Ht 73.25 in | Wt >= 6400 oz

## 2016-05-27 DIAGNOSIS — E119 Type 2 diabetes mellitus without complications: Secondary | ICD-10-CM | POA: Insufficient documentation

## 2016-05-27 DIAGNOSIS — M1000 Idiopathic gout, unspecified site: Secondary | ICD-10-CM | POA: Diagnosis not present

## 2016-05-27 DIAGNOSIS — I10 Essential (primary) hypertension: Secondary | ICD-10-CM | POA: Diagnosis not present

## 2016-05-27 DIAGNOSIS — Z0000 Encounter for general adult medical examination without abnormal findings: Secondary | ICD-10-CM | POA: Diagnosis not present

## 2016-05-27 LAB — POC URINALSYSI DIPSTICK (AUTOMATED)
Bilirubin, UA: NEGATIVE
Blood, UA: NEGATIVE
Glucose, UA: NEGATIVE
Ketones, UA: NEGATIVE
LEUKOCYTES UA: NEGATIVE
Nitrite, UA: NEGATIVE
PROTEIN UA: NEGATIVE
Spec Grav, UA: 1.015 (ref 1.010–1.025)
UROBILINOGEN UA: 0.2 U/dL
pH, UA: 6 (ref 5.0–8.0)

## 2016-05-27 LAB — BASIC METABOLIC PANEL
BUN: 9 mg/dL (ref 6–23)
CO2: 27 meq/L (ref 19–32)
Calcium: 9.2 mg/dL (ref 8.4–10.5)
Chloride: 101 mEq/L (ref 96–112)
Creatinine, Ser: 0.7 mg/dL (ref 0.40–1.50)
GFR: 135.25 mL/min (ref >60.00)
Glucose, Bld: 124 mg/dL — ABNORMAL HIGH (ref 70–99)
POTASSIUM: 3.6 meq/L (ref 3.5–5.1)
Sodium: 136 mEq/L (ref 135–145)

## 2016-05-27 LAB — TSH: TSH: 2.74 u[IU]/mL (ref 0.35–4.50)

## 2016-05-27 LAB — HEPATIC FUNCTION PANEL
ALT: 32 U/L (ref 0–53)
AST: 26 U/L (ref 0–37)
Albumin: 4.4 g/dL (ref 3.5–5.2)
Alkaline Phosphatase: 98 U/L (ref 39–117)
BILIRUBIN DIRECT: 0.2 mg/dL (ref 0.0–0.3)
BILIRUBIN TOTAL: 0.8 mg/dL (ref 0.2–1.2)
Total Protein: 7.7 g/dL (ref 6.0–8.3)

## 2016-05-27 LAB — CBC WITH DIFFERENTIAL/PLATELET
Basophils Absolute: 0.1 10*3/uL (ref 0.0–0.1)
Basophils Relative: 0.9 % (ref 0.0–3.0)
Eosinophils Absolute: 0.2 10*3/uL (ref 0.0–0.7)
Eosinophils Relative: 1.7 % (ref 0.0–5.0)
HEMATOCRIT: 45.2 % (ref 39.0–52.0)
HEMOGLOBIN: 15.3 g/dL (ref 13.0–17.0)
LYMPHS PCT: 19.6 % (ref 12.0–46.0)
Lymphs Abs: 1.9 10*3/uL (ref 0.7–4.0)
MCHC: 33.9 g/dL (ref 30.0–36.0)
MCV: 83.7 fl (ref 78.0–100.0)
MONOS PCT: 5.4 % (ref 3.0–12.0)
Monocytes Absolute: 0.5 10*3/uL (ref 0.1–1.0)
Neutro Abs: 7.1 10*3/uL (ref 1.4–7.7)
Neutrophils Relative %: 72.4 % (ref 43.0–77.0)
Platelets: 210 10*3/uL (ref 150.0–400.0)
RBC: 5.4 Mil/uL (ref 4.22–5.81)
RDW: 15.7 % — ABNORMAL HIGH (ref 11.5–15.5)
WBC: 9.7 10*3/uL (ref 4.0–10.5)

## 2016-05-27 LAB — HEMOGLOBIN A1C: HEMOGLOBIN A1C: 8.6 % — AB (ref 4.6–6.5)

## 2016-05-27 LAB — LIPID PANEL
Cholesterol: 133 mg/dL (ref 0–200)
HDL: 32.4 mg/dL — ABNORMAL LOW (ref >39.00)
LDL CALC: 66 mg/dL (ref 0–99)
NonHDL: 100.78
TRIGLYCERIDES: 176 mg/dL — AB (ref 0.0–149.0)
Total CHOL/HDL Ratio: 4
VLDL: 35.2 mg/dL (ref 0.0–40.0)

## 2016-05-27 LAB — URIC ACID: Uric Acid, Serum: 3.9 mg/dL — ABNORMAL LOW (ref 4.0–7.8)

## 2016-05-27 MED ORDER — DILTIAZEM HCL 90 MG PO TABS: 90.0000 mg | ORAL_TABLET | Freq: Every day | ORAL | 11 refills | Status: DC

## 2016-05-27 MED ORDER — EPINEPHRINE 0.3 MG/0.3ML IJ SOAJ: 0.3000 mg | Freq: Once | INTRAMUSCULAR | 5 refills | Status: AC

## 2016-05-27 MED ORDER — LISINOPRIL-HYDROCHLOROTHIAZIDE 20-25 MG PO TABS: 1.0000 | ORAL_TABLET | Freq: Every day | ORAL | 11 refills | Status: DC

## 2016-05-27 MED ORDER — ALLOPURINOL 300 MG PO TABS: 300.0000 mg | ORAL_TABLET | Freq: Every morning | ORAL | 11 refills | Status: DC

## 2016-05-27 MED ORDER — METFORMIN HCL 1000 MG PO TABS: 1000.0000 mg | ORAL_TABLET | Freq: Two times a day (BID) | ORAL | 11 refills | Status: DC

## 2016-05-27 NOTE — Progress Notes (Signed)
   Subjective:    Patient ID: Danny Bauer, male    DOB: May 12, 1980, 36 y.o.   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 He saw an Occupational nurse on 04-01-16 and he was passed except for poorly controlled diabetes. His A1c that day was 9.4. He feels fine and he takes his medications regularly. However he remains quite overweight, he gets no exercise, and he admits to eating a very poor diet.    Review of Systems  Constitutional: Negative.   HENT: Negative.   Eyes: Negative.   Respiratory: Negative.   Cardiovascular: Negative.   Gastrointestinal: Negative.   Genitourinary: Negative.   Musculoskeletal: Negative.   Skin: Negative.   Neurological: Negative.   Psychiatric/Behavioral: Negative.        Objective:   Physical Exam  Constitutional: He is oriented to person, place, and time. No distress.  Morbidly obese   HENT:  Head: Normocephalic and atraumatic.  Right Ear: External ear normal.  Left Ear: External ear normal.  Nose: Nose normal.  Mouth/Throat: Oropharynx is clear and moist. No oropharyngeal exudate.  Eyes: Conjunctivae and EOM are normal. Pupils are equal, round, and reactive to light. Right eye exhibits no discharge. Left eye exhibits no discharge. No scleral icterus.  Neck: Neck supple. No JVD present. No tracheal deviation present. No thyromegaly present.  Cardiovascular: Normal rate, regular rhythm, normal heart sounds and intact distal pulses.  Exam reveals no gallop and no friction rub.   No murmur heard. EKG is within normal limits   Pulmonary/Chest: Effort normal and breath sounds normal. No respiratory distress. He has no wheezes. He has no rales. He exhibits no tenderness.  Abdominal: Soft. Bowel sounds are normal. He exhibits no distension and no mass. There is no tenderness. There is no rebound and no guarding.  Genitourinary: Rectum normal, prostate normal and penis normal. Rectal exam shows  guaiac negative stool. No penile tenderness.  Musculoskeletal: Normal range of motion. He exhibits no edema or tenderness.  Lymphadenopathy:    He has no cervical adenopathy.  Neurological: He is alert and oriented to person, place, and time. He has normal reflexes. No cranial nerve deficit. He exhibits normal muscle tone. Coordination normal.  Skin: Skin is warm and dry. No rash noted. He is not diaphoretic. No erythema. No pallor.  Psychiatric: He has a normal mood and affect. His behavior is normal. Judgment and thought content normal.          Assessment & Plan:  Well exam. We discussed diet and exercise. Get fasting labs today. His HTN is stable. For the diabetes we will increase the Metformin to 1000 mg bid and check another A1c today. After his labs return, we will fill out the forms and give them out opinion as to the safety of his driving.  Gershon CraneStephen Edson Deridder, MD

## 2016-05-27 NOTE — Patient Instructions (Signed)
WE NOW OFFER   [facility] FAST TRACK!!!  SAME DAY Appointments for ACUTE CARE  Such as: Sprains, Injuries, cuts, abrasions, rashes, muscle pain, joint pain, back pain Colds, flu, sore throats, headache, allergies, cough, fever  Ear pain, sinus and eye infections Abdominal pain, nausea, vomiting, diarrhea, upset stomach Animal/insect bites  3 Easy Ways to Schedule: Walk-In Scheduling Call in scheduling Mychart Sign-up: [email or web address]         

## 2016-05-28 NOTE — Addendum Note (Signed)
Addended by: Johnella MoloneyFUNDERBURK, Simora Dingee A on: 05/28/2016 09:50 AM   Modules accepted: Orders

## 2016-08-30 ENCOUNTER — Other Ambulatory Visit (INDEPENDENT_AMBULATORY_CARE_PROVIDER_SITE_OTHER): Payer: Self-pay

## 2016-08-30 DIAGNOSIS — E119 Type 2 diabetes mellitus without complications: Secondary | ICD-10-CM

## 2016-08-30 LAB — HEMOGLOBIN A1C: Hgb A1c MFr Bld: 8.6 % — ABNORMAL HIGH (ref 4.6–6.5)

## 2016-09-03 ENCOUNTER — Other Ambulatory Visit: Payer: Self-pay | Admitting: Family Medicine

## 2016-09-03 MED ORDER — GLIPIZIDE 10 MG PO TABS: 10.0000 mg | ORAL_TABLET | Freq: Two times a day (BID) | ORAL | 11 refills | Status: DC

## 2016-10-03 ENCOUNTER — Encounter: Payer: Self-pay | Admitting: Family Medicine

## 2016-10-07 IMAGING — CR DG WRIST COMPLETE 3+V*L*
4 series · 4 of 4 positions shown · non-contrast
Comparison: None.

CLINICAL DATA: Acute onset of left wrist pain.  Initial encounter.

EXAM:
LEFT WRIST - COMPLETE 3+ VIEW

[PA]
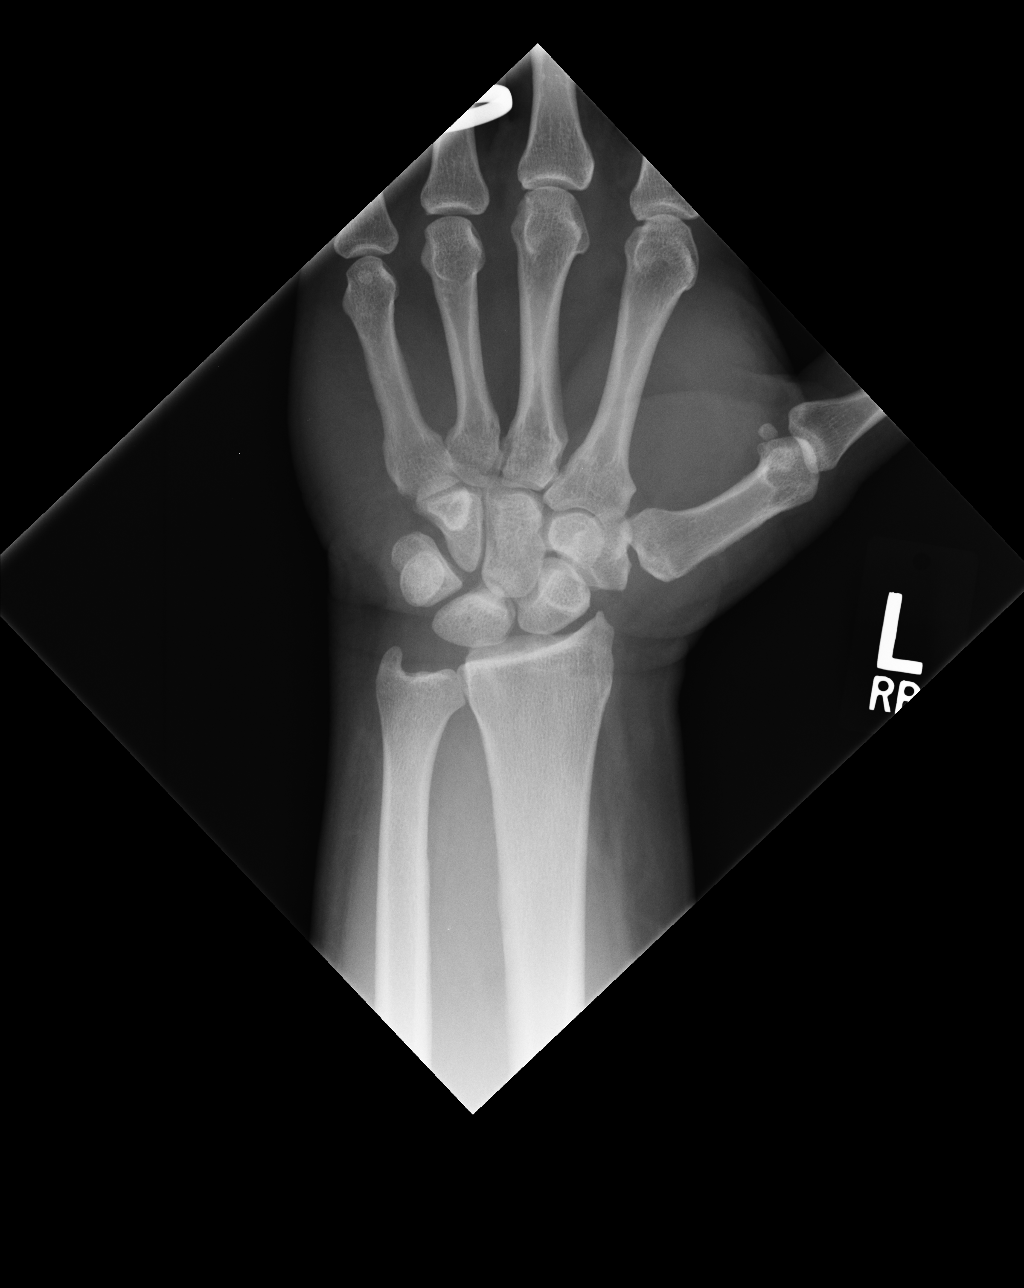

[pa int rot]
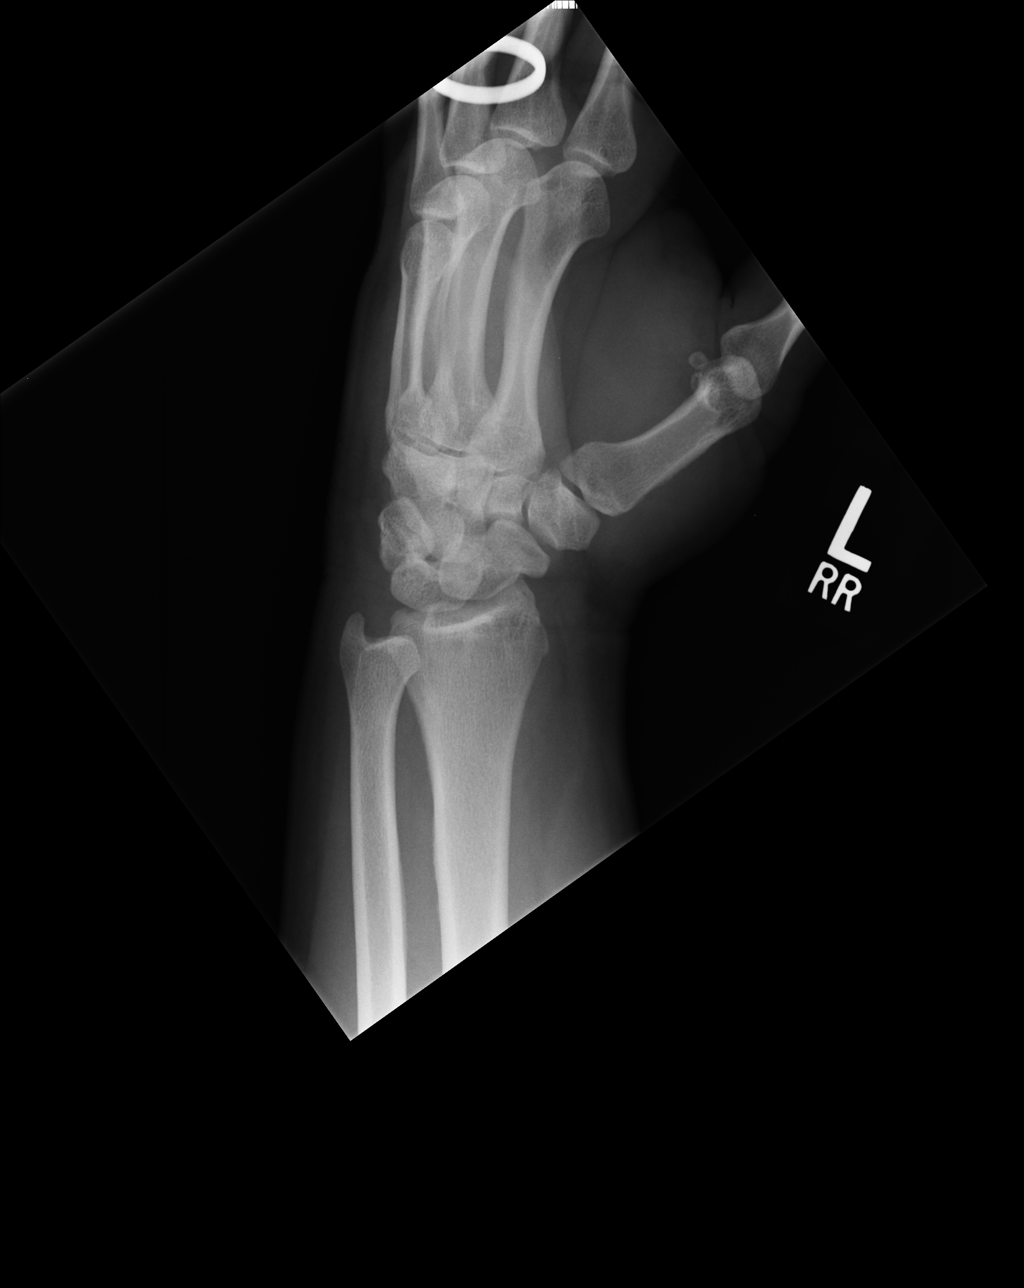

[lateral]
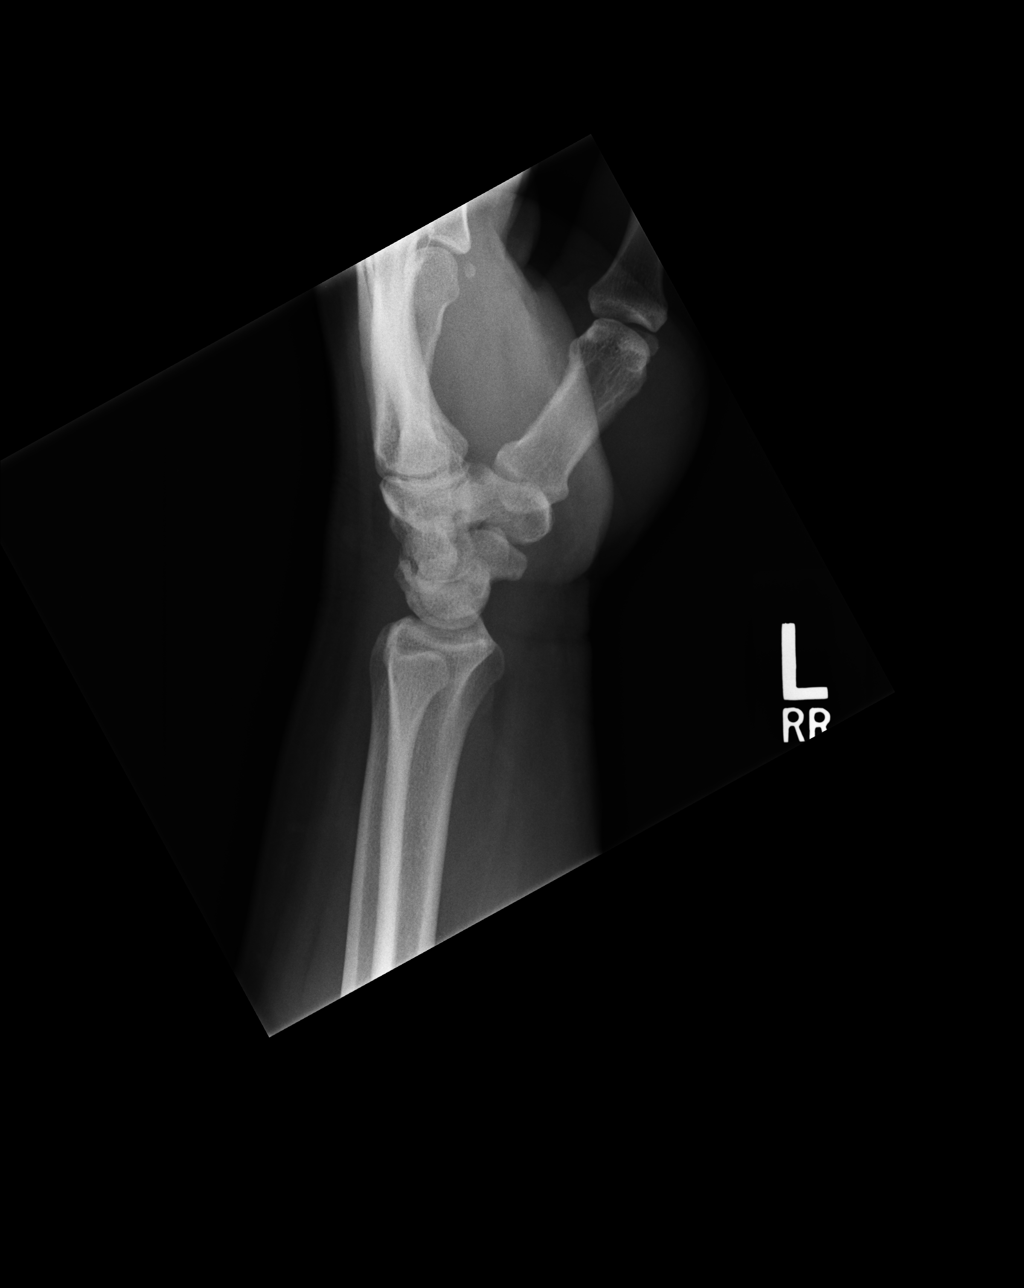

[ap ext rot]
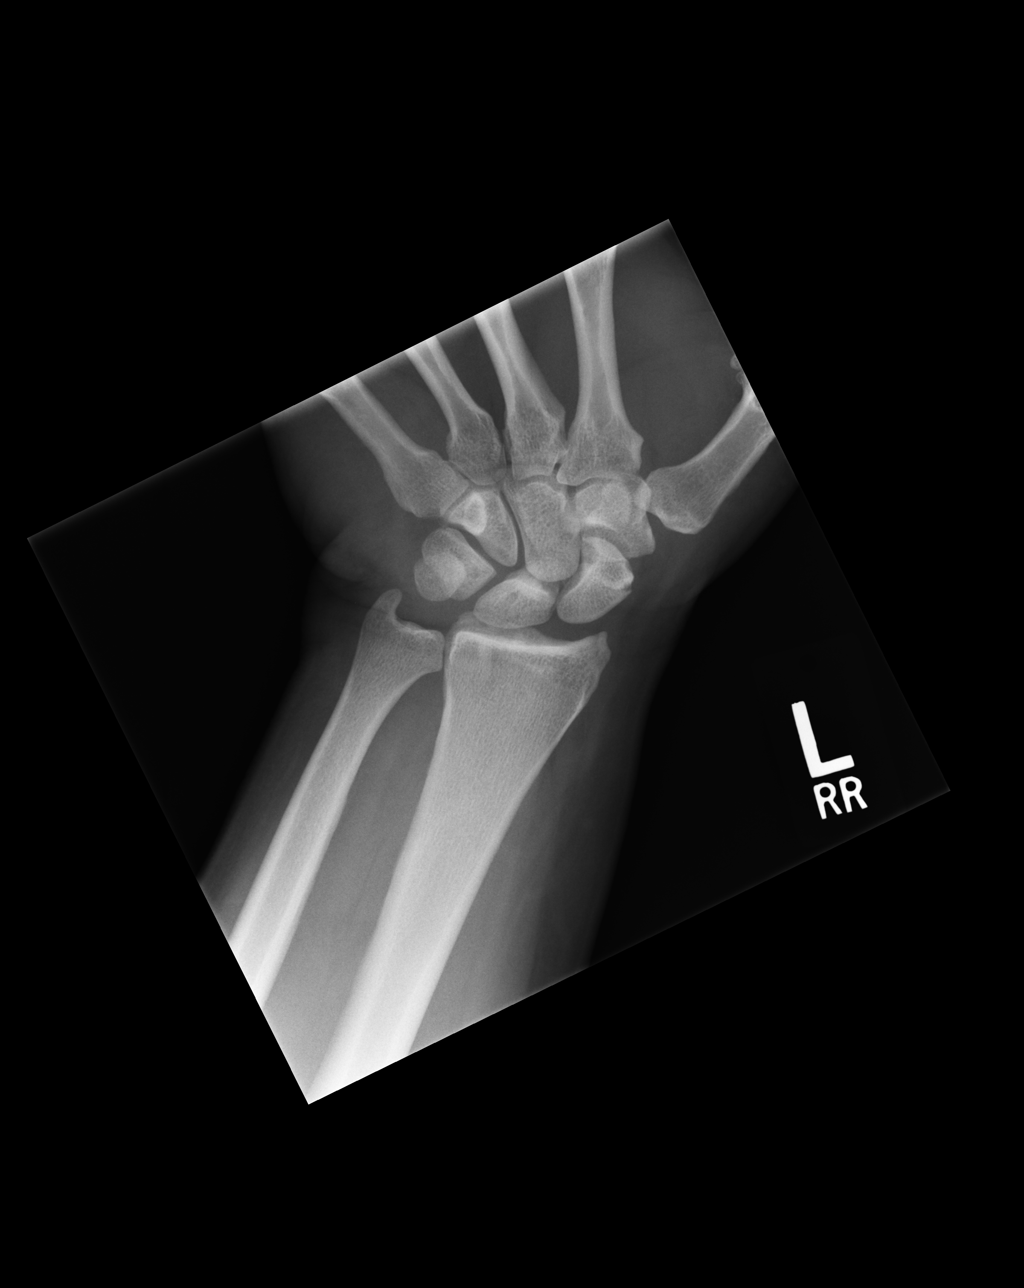

[4 of 4 positions shown; findings below may reference images not displayed]

FINDINGS: There is no evidence of fracture or dislocation. The carpal rows are
intact, and demonstrate normal alignment. The joint spaces are
preserved.

No significant soft tissue abnormalities are seen.
IMPRESSION: No evidence of fracture or dislocation.

## 2017-07-22 ENCOUNTER — Telehealth: Payer: Self-pay | Admitting: Family Medicine

## 2017-07-22 ENCOUNTER — Other Ambulatory Visit: Payer: Self-pay | Admitting: Family Medicine

## 2017-07-22 DIAGNOSIS — E119 Type 2 diabetes mellitus without complications: Secondary | ICD-10-CM

## 2017-07-22 NOTE — Telephone Encounter (Signed)
Sent to PCP to advise 

## 2017-07-22 NOTE — Telephone Encounter (Signed)
Copied from CRM [phone]. Topic: General - Other >> Jul 22, 2017  8:25 AM Leafy Roobinson, [name] wrote:  Reason for CRM: pt has not seen dr fry in about 11 month. 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 Pt is dm and would like to know if dr fry would be in A1C order to have done at [place] location. Pt does not have transportation  To get to [place]

## 2017-07-22 NOTE — Telephone Encounter (Signed)
Shouldn't be a problem for [facility] to [facility], [facility]. Attempted to call patient, phone ran on end. No answer, no voicemail.

## 2017-07-22 NOTE — Telephone Encounter (Signed)
I ordered this in Epic. Not sure if it will work or not

## 2017-07-22 NOTE — Progress Notes (Signed)
done

## 2017-07-23 ENCOUNTER — Other Ambulatory Visit (INDEPENDENT_AMBULATORY_CARE_PROVIDER_SITE_OTHER): Payer: Self-pay

## 2017-07-23 DIAGNOSIS — E119 Type 2 diabetes mellitus without complications: Secondary | ICD-10-CM

## 2017-07-23 LAB — HEMOGLOBIN A1C: HEMOGLOBIN A1C: 9.4 % — AB (ref 4.6–6.5)

## 2017-07-23 NOTE — Telephone Encounter (Signed)
[facility]

## 2017-07-24 ENCOUNTER — Telehealth: Payer: Self-pay | Admitting: *Deleted

## 2017-07-24 ENCOUNTER — Other Ambulatory Visit: Payer: Self-pay

## 2017-07-24 MED ORDER — SITAGLIPTIN PHOSPHATE 100 MG PO TABS: 100.0000 mg | ORAL_TABLET | Freq: Every day | ORAL | 3 refills | Status: DC

## 2017-07-24 NOTE — Telephone Encounter (Signed)
Rx/forms faxed. Fax confirmation received. Pt notified and verbalized understanding.

## 2017-07-24 NOTE — Telephone Encounter (Signed)
Januvia 100 mg daily. Call in #[phone] with 3 rf. Recheck an A1c in 90 days

## 2017-07-24 NOTE — Telephone Encounter (Signed)
Copied from CRM [phone]. 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

## 2017-07-25 NOTE — Telephone Encounter (Signed)
Refaxed as requested. Fax confirmation received.

## 2017-07-25 NOTE — Telephone Encounter (Signed)
Patient calling and states that [facility] is needing the fax resent. States they have misplaced it when they received it yesterday. Please advise.

## 2017-08-31 ENCOUNTER — Emergency Department
Admission: EM | Admit: 2017-08-31 | Discharge: 2017-08-31 | Disposition: A | Discharge status: Home / Self Care | Payer: Self-pay | Attending: Student in an Organized Health Care Education/Training Program | Admitting: Student in an Organized Health Care Education/Training Program

## 2017-08-31 ENCOUNTER — Encounter: Payer: Self-pay | Admitting: Emergency Medicine

## 2017-08-31 ENCOUNTER — Other Ambulatory Visit: Payer: Self-pay

## 2017-08-31 DIAGNOSIS — Y929 Unspecified place or not applicable: Secondary | ICD-10-CM | POA: Insufficient documentation

## 2017-08-31 DIAGNOSIS — T300 Burn of unspecified body region, unspecified degree: Secondary | ICD-10-CM

## 2017-08-31 DIAGNOSIS — Y998 Other external cause status: Secondary | ICD-10-CM | POA: Insufficient documentation

## 2017-08-31 DIAGNOSIS — Y93G1 Activity, food preparation and clean up: Secondary | ICD-10-CM | POA: Insufficient documentation

## 2017-08-31 DIAGNOSIS — T24212A Burn of second degree of left thigh, initial encounter: Secondary | ICD-10-CM | POA: Insufficient documentation

## 2017-08-31 DIAGNOSIS — J45909 Unspecified asthma, uncomplicated: Secondary | ICD-10-CM | POA: Insufficient documentation

## 2017-08-31 DIAGNOSIS — X101XXA Contact with hot food, initial encounter: Secondary | ICD-10-CM | POA: Insufficient documentation

## 2017-08-31 DIAGNOSIS — E119 Type 2 diabetes mellitus without complications: Secondary | ICD-10-CM | POA: Insufficient documentation

## 2017-08-31 DIAGNOSIS — Z23 Encounter for immunization: Secondary | ICD-10-CM | POA: Insufficient documentation

## 2017-08-31 DIAGNOSIS — Z79899 Other long term (current) drug therapy: Secondary | ICD-10-CM | POA: Insufficient documentation

## 2017-08-31 DIAGNOSIS — I10 Essential (primary) hypertension: Secondary | ICD-10-CM | POA: Insufficient documentation

## 2017-08-31 DIAGNOSIS — Z7984 Long term (current) use of oral hypoglycemic drugs: Secondary | ICD-10-CM | POA: Insufficient documentation

## 2017-08-31 MED ORDER — SILVER SULFADIAZINE 1 % EX CREA
TOPICAL_CREAM | Freq: Once | CUTANEOUS | Status: AC
Start: 2017-08-31 — End: 2017-08-31
  Administered 2017-08-31: 1 via TOPICAL
  Filled 2017-08-31: qty 85

## 2017-08-31 MED ORDER — SILVER SULFADIAZINE 1 % EX CREA
1.0000 | TOPICAL_CREAM | Freq: Every day | CUTANEOUS | 0 refills | Status: DC
Start: 2017-08-31 — End: 2018-02-13

## 2017-08-31 MED ORDER — TETANUS-DIPHTH-ACELL PERTUSSIS 5-2.5-18.5 LF-MCG/0.5 IM SUSP
0.5000 mL | Freq: Once | INTRAMUSCULAR | Status: AC
  Administered 2017-08-31: 0.5 mL via INTRAMUSCULAR
  Filled 2017-08-31: qty 0.5

## 2017-08-31 NOTE — ED Triage Notes (Signed)
Pt presents to ED via POV with c/o of burn to left upper leg near groin from spilled microwave meal that occurred about two days ago. Pt reports blistering of area. Pt in NAD at this time.

## 2017-08-31 NOTE — ED Provider Notes (Signed)
[facility] Emergency Department Provider Note  ____________________________________________   First MD Initiated Contact with Patient 08/31/17 1115     (approximate)  I have reviewed the triage vital signs and the nursing notes.   HISTORY  Chief Complaint Burn    HPI Danny Bauer is a 37 y.o. male [facility] emergency department complaining of a burn to the left upper leg 2 days ago.  He spilled a Salisbury steak microwave meal on his upper leg.  He states he did have a large blister in the area which burst today.  He is concerned because it looks dark and is burning.  He denies any fever chills.  He is unsure of his last tetanus.  He does have a history of diabetes.    Past Medical History:  Diagnosis Date  . Diabetes mellitus without complication (HCC)   . GERD (gastroesophageal reflux disease)   . Hypertension   . Sleep apnea, obstructive    sees Dr. Suzanna Obey     Patient Active Problem List   Diagnosis Date Noted  . Type 2 diabetes mellitus without complication, without long-term current use of insulin (HCC) 05/27/2016  . Gout 05/30/2014  . MORBID OBESITY 12/17/2007  . SLEEP APNEA 12/17/2007  . CARPAL TUNNEL SYNDROME 07/01/2007  . HYPERTENSION 10/07/2006  . ASTHMA 08/27/2006  . GERD 08/27/2006    Past Surgical History:  Procedure Laterality Date  . NOSE SURGERY  2009   straighten nasal passage   . TOE AMPUTATION  2001   right great toe    Prior to Admission medications   Medication Sig Start Date End Date Taking? Authorizing Provider  allopurinol (ZYLOPRIM) 300 MG tablet Take 1 tablet (300 mg total) by mouth every morning. 05/27/16   Nelwyn Salisbury, MD  diltiazem (CARDIZEM) 90 MG tablet Take 1 tablet (90 mg total) by mouth daily. 05/27/16   Nelwyn Salisbury, MD  glipiZIDE (GLUCOTROL) 10 MG tablet Take 1 tablet (10 mg total) by mouth 2 (two) times daily before a meal. 09/03/16   Nelwyn Salisbury, MD  lisinopril-hydrochlorothiazide  (PRINZIDE,ZESTORETIC) 20-25 MG tablet Take 1 tablet by mouth daily. 05/27/16   Nelwyn Salisbury, MD  metFORMIN (GLUCOPHAGE) 1000 MG tablet Take 1 tablet (1,000 mg total) by mouth 2 (two) times daily with a meal. 05/27/16   Nelwyn Salisbury, MD  omeprazole (PRILOSEC) 20 MG capsule Take 20 mg by mouth every morning.     [provider]  silver sulfADIAZINE (SILVADENE) 1 % cream Apply 1 application topically daily. 08/31/17   Sherrie Mustache Roselyn Bering, PA-C  sitaGLIPtin (JANUVIA) 100 MG tablet Take 1 tablet (100 mg total) by mouth daily. 07/24/17   Nelwyn Salisbury, MD    Allergies Bee venom; Codeine; Hydrocodone-acetaminophen; and Prednisone  Family History  Problem Relation Age of Onset  . Heart failure Mother   . Heart disease Mother   . Heart attack Father   . Heart disease Father     Social History Social History   Tobacco Use  . Smoking status: Never Smoker  . Smokeless tobacco: Never Used  Substance Use Topics  . Alcohol use: No  . Drug use: No    Review of Systems  Constitutional: No fever/chills Eyes: No visual changes. ENT: No sore throat. Respiratory: Denies cough Genitourinary: Negative for dysuria. Musculoskeletal: Negative for back pain. Skin: Negative for rash.  Positive for burn to the left upper thigh    ____________________________________________   PHYSICAL EXAM:  VITAL SIGNS:  ED Triage Vitals  Enc Vitals Group     BP 08/31/17 1110 (!) 158/76     Pulse Rate 08/31/17 1110 83     Resp 08/31/17 1110 18     Temp 08/31/17 1110 98.4 F (36.9 C)     Temp Source 08/31/17 1110 Oral     SpO2 08/31/17 1110 93 %     Weight 08/31/17 1111 (!) 384 lb (174.2 kg)     Height 08/31/17 1111 6\' 2"  (1.88 m)     Head Circumference --      Peak Flow --      Pain Score 08/31/17 1111 2     Pain Loc --      Pain Edu? --      Excl. in GC? --     Constitutional: Alert and oriented. Well appearing and in no acute distress. Eyes: Conjunctivae are normal.  Head:  Atraumatic. Nose: No congestion/rhinnorhea. Mouth/Throat: Mucous membranes are moist.   Neck:  supple no lymphadenopathy noted Cardiovascular: Normal rate, regular rhythm. Respiratory: Normal respiratory effort.  No retractions  GU: deferred Musculoskeletal: FROM all extremities, warm and well perfused Neurologic:  Normal speech and language.  Skin:  Skin is warm, dry.  Positive for a second-degree burn to the left upper thigh.  The circumference is about 2 cm x 3 cm.  There are splash marks also noted on the thigh which do not have blisters.  Neurovascular is intact.   Psychiatric: Mood and affect are normal. Speech and behavior are normal.  ____________________________________________   LABS (all labs ordered are listed, but only abnormal results are displayed)  Labs Reviewed - No data to display ____________________________________________   ____________________________________________  RADIOLOGY    ____________________________________________   PROCEDURES  Procedure(s) performed: Silvadene dressing was applied by nursing staff  Procedures    ____________________________________________   INITIAL IMPRESSION / ASSESSMENT AND PLAN / ED COURSE  Pertinent labs & imaging results that were available during my care of the patient were reviewed by me and considered in my medical decision making (see chart for details).   Patient is 37 year old male presents emergency department complaining of a burn to the left upper leg.  He states he spilled a Salisbury steak microwave meal in his leg.  He is unsure of his last tetanus.  He denies any fever or chills.  He is concerned because the blister popped earlier today.  On physical exam the burn patient is second-degree on the left upper thigh.  The measurements of about 2 cm x 3 cm.  no drainage is noted.  Plain findings to the patient.  A Silvadene dressing was applied.  He was given a Tdap while in the emergency department.  He  is to follow-up with his regular doctor in 2 days for recheck.  He was given a prescription for Silvadene cream.  He is to apply this twice daily.  He states he understands will comply with our instructions.  Was discharged in stable condition.     As part of my medical decision making, I reviewed the following data within the electronic MEDICAL RECORD NUMBER Nursing notes reviewed and incorporated, Old chart reviewed, Notes from prior ED visits and [place] Controlled Substance Database  ____________________________________________   FINAL CLINICAL IMPRESSION(S) / ED DIAGNOSES  Final diagnoses:  Burn      NEW MEDICATIONS STARTED DURING THIS VISIT:  New Prescriptions   SILVER SULFADIAZINE (SILVADENE) 1 % CREAM    Apply 1 application topically daily.  Note:  This document was prepared using Dragon voice recognition software and may include unintentional dictation errors.    Faythe GheeFisher, Susan W, PA-C 08/31/17 1155    Willy Eddyobinson, Patrick, MD 08/31/17 1430

## 2017-08-31 NOTE — Discharge Instructions (Addendum)
Follow-up with your regular doctor for recheck in 2 days.  Apply Silvadene cream on the burn area twice daily.  Keep the area covered pain.  Take ibuprofen for pain as needed.  Return emergency department worsening.

## 2018-02-02 ENCOUNTER — Emergency Department: Payer: PRIVATE HEALTH INSURANCE

## 2018-02-02 ENCOUNTER — Other Ambulatory Visit: Payer: Self-pay

## 2018-02-02 ENCOUNTER — Encounter: Payer: Self-pay | Admitting: *Deleted

## 2018-02-02 ENCOUNTER — Emergency Department
Admission: EM | Admit: 2018-02-02 | Discharge: 2018-02-02 | Disposition: A | Discharge status: Home / Self Care | Payer: PRIVATE HEALTH INSURANCE | Attending: Emergency Medicine | Admitting: Emergency Medicine

## 2018-02-02 DIAGNOSIS — M5416 Radiculopathy, lumbar region: Secondary | ICD-10-CM | POA: Insufficient documentation

## 2018-02-02 DIAGNOSIS — E119 Type 2 diabetes mellitus without complications: Secondary | ICD-10-CM | POA: Insufficient documentation

## 2018-02-02 DIAGNOSIS — J45909 Unspecified asthma, uncomplicated: Secondary | ICD-10-CM | POA: Diagnosis not present

## 2018-02-02 DIAGNOSIS — Z79899 Other long term (current) drug therapy: Secondary | ICD-10-CM | POA: Diagnosis not present

## 2018-02-02 DIAGNOSIS — I10 Essential (primary) hypertension: Secondary | ICD-10-CM | POA: Insufficient documentation

## 2018-02-02 DIAGNOSIS — M79604 Pain in right leg: Secondary | ICD-10-CM | POA: Diagnosis present

## 2018-02-02 MED ORDER — DEXAMETHASONE SODIUM PHOSPHATE 10 MG/ML IJ SOLN
10.0000 mg | Freq: Once | INTRAMUSCULAR | Status: AC
  Administered 2018-02-02: 10 mg via INTRAMUSCULAR
  Filled 2018-02-02: qty 1

## 2018-02-02 MED ORDER — METHOCARBAMOL 500 MG PO TABS: 500.0000 mg | ORAL_TABLET | Freq: Three times a day (TID) | ORAL | 0 refills | Status: AC | PRN

## 2018-02-02 MED ORDER — DEXAMETHASONE 6 MG PO TABS: 6.0000 mg | ORAL_TABLET | Freq: Every day | ORAL | 0 refills | Status: AC

## 2018-02-02 NOTE — ED Provider Notes (Signed)
[facility] Emergency Department Provider Note  ____________________________________________  Time seen: Approximately 4:32 PM  I have reviewed the triage vital signs and the nursing notes.   HISTORY  Chief Complaint Leg Pain    HPI SEGIO Bauer is a 38 y.o. male presents to the emergency department with right lower extremity radiculopathy for approximately 1 month.  Patient denies falls, traumas or history of low back pain.  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  Patient reports that his pain starts at the lateral right hip and runs down the right lateral thigh and stops at the knee.  Patient states that he has had no warmth, swelling or erythema along the right lower extremity.  Patient is a local truck driver and is not sedentary more than 3 hours at a time.  No recent surgery.  Patient denies daily smoking.  He does have a history of obesity.  He denies history of DVT or PE.  No shortness of breath or chest tightness.  He has been taking ibuprofen at home which temporarily relieves his pain.  No other alleviating measures have been attempted.    Past Medical History:  Diagnosis Date  . Diabetes mellitus without complication (HCC)   . GERD (gastroesophageal reflux disease)   . Hypertension   . Sleep apnea, obstructive    sees Dr. Suzanna Bauer     Patient Active Problem List   Diagnosis Date Noted  . Type 2 diabetes mellitus without complication, without long-term current use of insulin (HCC) 05/27/2016  . Gout 05/30/2014  . MORBID OBESITY 12/17/2007  . SLEEP APNEA 12/17/2007  . CARPAL TUNNEL SYNDROME 07/01/2007  . HYPERTENSION 10/07/2006  . ASTHMA 08/27/2006  . GERD 08/27/2006    Past Surgical History:  Procedure Laterality Date  . NOSE SURGERY  2009   straighten nasal passage   . TOE AMPUTATION  2001   right great toe    Prior to Admission medications   Medication Sig Start Date  End Date Taking? Authorizing Provider  allopurinol (ZYLOPRIM) 300 MG tablet Take 1 tablet (300 mg total) by mouth every morning. 05/27/16   Nelwyn Salisbury, MD  dexamethasone (DECADRON) 6 MG tablet Take 1 tablet (6 mg total) by mouth daily for 5 days. 02/02/18 02/07/18  Orvil Feil, PA-C  diltiazem (CARDIZEM) 90 MG tablet Take 1 tablet (90 mg total) by mouth daily. 05/27/16   Nelwyn Salisbury, MD  glipiZIDE (GLUCOTROL) 10 MG tablet Take 1 tablet (10 mg total) by mouth 2 (two) times daily before a meal. 09/03/16   Nelwyn Salisbury, MD  lisinopril-hydrochlorothiazide (PRINZIDE,ZESTORETIC) 20-25 MG tablet Take 1 tablet by mouth daily. 05/27/16   Nelwyn Salisbury, MD  metFORMIN (GLUCOPHAGE) 1000 MG tablet Take 1 tablet (1,000 mg total) by mouth 2 (two) times daily with a meal. 05/27/16   Nelwyn Salisbury, MD  methocarbamol (ROBAXIN) 500 MG tablet Take 1 tablet (500 mg total) by mouth every 8 (eight) hours as needed for up to 5 days. 02/02/18 02/07/18  Orvil Feil, PA-C  omeprazole (PRILOSEC) 20 MG capsule Take 20 mg by mouth every morning.     [provider]  silver sulfADIAZINE (SILVADENE) 1 % cream Apply 1 application topically daily. 08/31/17   Sherrie Mustache Roselyn Bering, PA-C  sitaGLIPtin (JANUVIA) 100 MG tablet Take 1 tablet (100 mg total) by mouth daily. 07/24/17   Nelwyn Salisbury, MD    Allergies Alphonsus Sias  venom; Codeine; Hydrocodone-acetaminophen; and Prednisone  Family History  Problem Relation Age of Onset  . Heart failure Mother   . Heart disease Mother   . Heart attack Father   . Heart disease Father     Social History Social History   Tobacco Use  . Smoking status: Never Smoker  . Smokeless tobacco: Never Used  Substance Use Topics  . Alcohol use: No  . Drug use: No     Review of Systems  Constitutional: No fever/chills Eyes: No visual changes. No discharge ENT: No upper respiratory complaints. Cardiovascular: no chest pain. Respiratory: no cough. No SOB. Gastrointestinal: No abdominal  pain.  No nausea, no vomiting.  No diarrhea.  No constipation. Genitourinary: Negative for dysuria. No hematuria Musculoskeletal: Negative for musculoskeletal pain. Skin: Negative for rash, abrasions, lacerations, ecchymosis. Neurological: Patient has right lower extremity radiculopathy.    ____________________________________________   PHYSICAL EXAM:  VITAL SIGNS: ED Triage Vitals  Enc Vitals Group     BP 02/02/18 1503 (!) 160/91     Pulse Rate 02/02/18 1503 83     Resp 02/02/18 1503 16     Temp 02/02/18 1503 98.9 F (37.2 C)     Temp Source 02/02/18 1503 Oral     SpO2 02/02/18 1503 96 %     Weight 02/02/18 1504 (!) 384 lb 0.7 oz (174.2 kg)     Height 02/02/18 1504 6\' 2"  (1.88 m)     Head Circumference --      Peak Flow --      Pain Score 02/02/18 1504 0     Pain Loc --      Pain Edu? --      Excl. in GC? --      Constitutional: Alert and oriented. Well appearing and in no acute distress. Eyes: Conjunctivae are normal. PERRL. EOMI. Head: Atraumatic. Cardiovascular: Normal rate, regular rhythm. Normal S1 and S2.  Good peripheral circulation. Respiratory: Normal respiratory effort without tachypnea or retractions. Lungs CTAB. Good air entry to the bases with no decreased or absent breath sounds. Gastrointestinal: Bowel sounds 4 quadrants. Soft and nontender to palpation. No guarding or rigidity. No palpable masses. No distention. No CVA tenderness. Musculoskeletal: Full range of motion to all extremities. No gross deformities appreciated.  Positive straight leg raise test, right. Neurologic:  Normal speech and language. No gross focal neurologic deficits are appreciated.  Skin:  Skin is warm, dry and intact. No rash noted. Psychiatric: Mood and affect are normal. Speech and behavior are normal. Patient exhibits appropriate insight and judgement.   ____________________________________________   LABS (all labs ordered are listed, but only abnormal results are  displayed)  Labs Reviewed - No data to display ____________________________________________  EKG   ____________________________________________  RADIOLOGY I personally viewed and evaluated these images as part of my medical decision making, as well as reviewing the written report by the radiologist.  Dg Lumbar Spine 2-3 Views  Result Date: 02/02/2018 CLINICAL DATA:  Right-sided low back and leg pain for the past month. No known injury. EXAM: LUMBAR SPINE - 2-3 VIEW COMPARISON:  Lumbar spine x-rays dated April 27, 2013. FINDINGS: Five lumbar type vertebral bodies. No acute fracture or subluxation. Vertebral body heights are preserved. Alignment is normal. Unchanged mild disc height loss at L4-L5. Remaining intervertebral disc spaces are maintained. Progressive anterior endplate osteophyte formation at L3-L4 and L4-L5. Mild facet arthropathy at L5-S1. The sacroiliac joints are unremarkable. IMPRESSION: 1.  No acute osseous abnormality. 2. Progressive mild degenerative changes at  L3-L4 and L4-L5. Electronically Signed   By: Obie Dredge M.D.   On: 02/02/2018 15:58    ____________________________________________    PROCEDURES  Procedure(s) performed:    Procedures    Medications  dexamethasone (DECADRON) injection 10 mg (10 mg Intramuscular Given 02/02/18 1626)     ____________________________________________   INITIAL IMPRESSION / ASSESSMENT AND PLAN / ED COURSE  Pertinent labs & imaging results that were available during my care of the patient were reviewed by me and considered in my medical decision making (see chart for details).  Review of the [place] CSRS was performed in accordance of the [facility] prior to dispensing any controlled drugs.      Assessment and plan Lumbar radiculopathy Patient presents to the emergency department with referred pain from the lumbar spine that extends from right lateral thigh to knee.  Patient had a positive straight leg raise test on  physical exam.  X-ray examination reveals degenerative changes at L3, L4 and L5.  Patient was given an injection of Decadron after he reports that ibuprofen no longer helps his pain.  Patient was started on a short course of Decadron as he reports that prednisone typically causes intolerable diarrhea.  Patient was advised to follow-up with primary care as needed.  All patient questions were answered.    ____________________________________________  FINAL CLINICAL IMPRESSION(S) / ED DIAGNOSES  Final diagnoses:  Lumbar radiculopathy      NEW MEDICATIONS STARTED DURING THIS VISIT:  ED Discharge Orders         Ordered    dexamethasone (DECADRON) 6 MG tablet  Daily     02/02/18 1621    methocarbamol (ROBAXIN) 500 MG tablet  Every 8 hours PRN     02/02/18 1621              This chart was dictated using voice recognition software/Dragon. Despite best efforts to proofread, errors can occur which can change the meaning. Any change was purely unintentional.    Orvil Feil, PA-C 02/02/18 1719    Sharman Cheek, MD 02/05/18 [id number]

## 2018-02-02 NOTE — ED Triage Notes (Signed)
Pt to ED reporting right leg pain from hip to  Foot. Pt denies numbness, injury or swelling. Pt denies having ever been dx with sciatica. PT able to ambulate but reports pain increases with stepping into truck.

## 2018-02-02 NOTE — ED Notes (Signed)
See triage note Presents with right foot/leg pain  denies any injury  But states pain increases with ambulation.

## 2018-02-02 NOTE — ED Triage Notes (Signed)
First Nurse Note:  C/O right upper leg pain.  Onset of symptoms "for a while".  Denies injury.    AAOx3.  Skin warm and dry. NAD

## 2018-02-04 ENCOUNTER — Other Ambulatory Visit: Payer: Self-pay | Admitting: Family Medicine

## 2018-02-04 MED ORDER — LISINOPRIL-HYDROCHLOROTHIAZIDE 20-25 MG PO TABS: 1.0000 | ORAL_TABLET | Freq: Every day | ORAL | 0 refills | Status: DC

## 2018-02-04 MED ORDER — GLIPIZIDE 10 MG PO TABS: 10.0000 mg | ORAL_TABLET | Freq: Two times a day (BID) | ORAL | 0 refills | Status: DC

## 2018-02-04 MED ORDER — DILTIAZEM HCL 90 MG PO TABS: 90.0000 mg | ORAL_TABLET | Freq: Every day | ORAL | 0 refills | Status: DC

## 2018-02-04 MED ORDER — METFORMIN HCL 1000 MG PO TABS: 1000.0000 mg | ORAL_TABLET | Freq: Two times a day (BID) | ORAL | 0 refills | Status: DC

## 2018-02-04 MED ORDER — SITAGLIPTIN PHOSPHATE 100 MG PO TABS: 100.0000 mg | ORAL_TABLET | Freq: Every day | ORAL | 0 refills | Status: DC

## 2018-02-04 NOTE — Telephone Encounter (Signed)
Copied from CRM [id number]. Topic: Quick Communication - Rx Refill/Question >> Feb 04, 2018  9:00 AM Lynne Logan D wrote: Medication: diltiazem (CARDIZEM) 90 MG tablet / glipiZIDE (GLUCOTROL) 10 MG tablet / lisinopril-hydrochlorothiazide (PRINZIDE,ZESTORETIC) 20-25 MG tablet / metFORMIN (GLUCOPHAGE) 1000 MG tablet / sitaGLIPtin (JANUVIA) 100 MG tablet   Has the patient contacted their pharmacy?    (Agent: If no, request that the patient contact the pharmacy for the refill.) (Agent: If yes, when and what did the pharmacy advise?)  Preferred Pharmacy (with phone number or street name): [facility] [address]), [place] - [address] [phone] (Phone) [phone] (Fax)    Agent: Please be advised that RX refills may take up to 3 business days. We ask that you follow-up with your pharmacy.

## 2018-02-04 NOTE — Telephone Encounter (Signed)
Courtesy refill. Has OV scheduled.

## 2018-02-13 ENCOUNTER — Ambulatory Visit (INDEPENDENT_AMBULATORY_CARE_PROVIDER_SITE_OTHER): Payer: PRIVATE HEALTH INSURANCE | Admitting: Family Medicine

## 2018-02-13 ENCOUNTER — Encounter: Payer: Self-pay | Admitting: Family Medicine

## 2018-02-13 VITALS — BP 126/84 | HR 75 | Temp 98.5°F | Ht 73.0 in | Wt 362.1 lb

## 2018-02-13 DIAGNOSIS — E119 Type 2 diabetes mellitus without complications: Secondary | ICD-10-CM

## 2018-02-13 DIAGNOSIS — M1000 Idiopathic gout, unspecified site: Secondary | ICD-10-CM | POA: Diagnosis not present

## 2018-02-13 DIAGNOSIS — Z23 Encounter for immunization: Secondary | ICD-10-CM | POA: Diagnosis not present

## 2018-02-13 DIAGNOSIS — Z0000 Encounter for general adult medical examination without abnormal findings: Secondary | ICD-10-CM

## 2018-02-13 LAB — LIPID PANEL
CHOLESTEROL: 103 mg/dL (ref 0–200)
HDL: 35.5 mg/dL — ABNORMAL LOW (ref >39.00)
LDL Cholesterol: 51 mg/dL (ref 0–99)
NonHDL: 67.67
Total CHOL/HDL Ratio: 3
Triglycerides: 81 mg/dL (ref 0.0–149.0)
VLDL: 16.2 mg/dL (ref 0.0–40.0)

## 2018-02-13 LAB — BASIC METABOLIC PANEL
BUN: 15 mg/dL (ref 6–23)
CO2: 31 mEq/L (ref 19–32)
CREATININE: 0.8 mg/dL (ref 0.40–1.50)
Calcium: 8.8 mg/dL (ref 8.4–10.5)
Chloride: 94 mEq/L — ABNORMAL LOW (ref 96–112)
GFR: 108.06 mL/min (ref >60.00)
GLUCOSE: 102 mg/dL — AB (ref 70–99)
Potassium: 3.3 mEq/L — ABNORMAL LOW (ref 3.5–5.1)
Sodium: 133 mEq/L — ABNORMAL LOW (ref 135–145)

## 2018-02-13 LAB — HEPATIC FUNCTION PANEL
ALBUMIN: 4 g/dL (ref 3.5–5.2)
ALT: 43 U/L (ref 0–53)
AST: 30 U/L (ref 0–37)
Alkaline Phosphatase: 86 U/L (ref 39–117)
Bilirubin, Direct: 0.2 mg/dL (ref 0.0–0.3)
Total Bilirubin: 0.8 mg/dL (ref 0.2–1.2)
Total Protein: 6.6 g/dL (ref 6.0–8.3)

## 2018-02-13 LAB — CBC WITH DIFFERENTIAL/PLATELET
Basophils Absolute: 0.1 10*3/uL (ref 0.0–0.1)
Basophils Relative: 0.5 % (ref 0.0–3.0)
Eosinophils Absolute: 0.1 10*3/uL (ref 0.0–0.7)
Eosinophils Relative: 0.7 % (ref 0.0–5.0)
HCT: 49 % (ref 39.0–52.0)
Hemoglobin: 16.5 g/dL (ref 13.0–17.0)
Lymphocytes Relative: 17.6 % (ref 12.0–46.0)
Lymphs Abs: 2.2 10*3/uL (ref 0.7–4.0)
MCHC: 33.6 g/dL (ref 30.0–36.0)
MCV: 83.8 fl (ref 78.0–100.0)
Monocytes Absolute: 0.7 10*3/uL (ref 0.1–1.0)
Monocytes Relative: 5.3 % (ref 3.0–12.0)
Neutro Abs: 9.6 10*3/uL — ABNORMAL HIGH (ref 1.4–7.7)
Neutrophils Relative %: 75.9 % (ref 43.0–77.0)
Platelets: 175 10*3/uL (ref 150.0–400.0)
RBC: 5.85 Mil/uL — ABNORMAL HIGH (ref 4.22–5.81)
RDW: 15.2 % (ref 11.5–15.5)
WBC: 12.6 10*3/uL — AB (ref 4.0–10.5)

## 2018-02-13 LAB — TSH: TSH: 2.41 u[IU]/mL (ref 0.35–4.50)

## 2018-02-13 LAB — HEMOGLOBIN A1C: HEMOGLOBIN A1C: 9.5 % — AB (ref 4.6–6.5)

## 2018-02-13 LAB — URIC ACID: Uric Acid, Serum: 4.3 mg/dL (ref 4.0–7.8)

## 2018-02-13 MED ORDER — METHOCARBAMOL 500 MG PO TABS: 500.0000 mg | ORAL_TABLET | Freq: Four times a day (QID) | ORAL | 5 refills | Status: DC | PRN

## 2018-02-13 MED ORDER — SITAGLIPTIN PHOSPHATE 100 MG PO TABS: 100.0000 mg | ORAL_TABLET | Freq: Every day | ORAL | 3 refills | Status: DC

## 2018-02-13 MED ORDER — GLIPIZIDE 10 MG PO TABS: 10.0000 mg | ORAL_TABLET | Freq: Two times a day (BID) | ORAL | 3 refills | Status: DC

## 2018-02-13 MED ORDER — DILTIAZEM HCL 90 MG PO TABS: 90.0000 mg | ORAL_TABLET | Freq: Every day | ORAL | 3 refills | Status: DC

## 2018-02-13 MED ORDER — LISINOPRIL-HYDROCHLOROTHIAZIDE 20-25 MG PO TABS: 1.0000 | ORAL_TABLET | Freq: Every day | ORAL | 3 refills | Status: DC

## 2018-02-13 MED ORDER — ALLOPURINOL 300 MG PO TABS: 300.0000 mg | ORAL_TABLET | Freq: Every morning | ORAL | 3 refills | Status: DC

## 2018-02-13 MED ORDER — METFORMIN HCL 1000 MG PO TABS: 1000.0000 mg | ORAL_TABLET | Freq: Two times a day (BID) | ORAL | 3 refills | Status: DC

## 2018-02-13 NOTE — Progress Notes (Signed)
Subjective:    Patient ID: Danny Bauer, male    DOB: 05-Nov-1980, 38 y.o.   MRN: [id number]  HPI Here for a well exam. He feels well except for some mild pains that shoot from the right buttock down the right leg. These started about 4 weeks ago, no hx of trauma. He was seen in the ER 10 days ago and had a lumbar Xray which showed some degenerated discs. He was given a course of Dexamethasone and Robaxin, and these were helpful. The pain is now milder and more tolerable. We started him on Januvia last July when his A1c jumped to 9.4, but he as not checked his glucoses since then. He has lost about 30 lbs in the past 6 months. 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    Review of Systems  Constitutional: Negative.   HENT: Negative.   Eyes: Negative.   Respiratory: Negative.   Cardiovascular: Negative.   Gastrointestinal: Negative.   Genitourinary: Negative.   Musculoskeletal: Positive for back pain.  Skin: Negative.   Neurological: Negative.   Psychiatric/Behavioral: Negative.        Objective:   Physical Exam Constitutional:      General: He is not in acute distress.    Appearance: He is well-developed. He is not diaphoretic.     Comments: Morbidly obese   HENT:     Head: Normocephalic and atraumatic.     Right Ear: External ear normal.     Left Ear: External ear normal.     Nose: Nose normal.     Mouth/Throat:     Pharynx: No oropharyngeal exudate.  Eyes:     General: No scleral icterus.       Right eye: No discharge.        Left eye: No discharge.     Conjunctiva/sclera: Conjunctivae normal.     Pupils: Pupils are equal, round, and reactive to light.  Neck:     Musculoskeletal: Neck supple.     Thyroid: No thyromegaly.     Vascular: No JVD.     Trachea: No tracheal deviation.  Cardiovascular:     Rate and Rhythm: Normal rate and regular rhythm.     Heart sounds: Normal heart sounds. No murmur. No friction rub. No gallop.     Pulmonary:     Effort: Pulmonary effort is normal. No respiratory distress.     Breath sounds: Normal breath sounds. No wheezing or rales.  Chest:     Chest wall: No tenderness.  Abdominal:     General: Bowel sounds are normal. There is no distension.     Palpations: Abdomen is soft. There is no mass.     Tenderness: There is no abdominal tenderness. There is no guarding or rebound.  Genitourinary:    Penis: Normal. No tenderness.   Musculoskeletal: Normal range of motion.        General: No tenderness.  Lymphadenopathy:     Cervical: No cervical adenopathy.  Skin:    General: Skin is warm and dry.     Coloration: Skin is not pale.     Findings: No erythema or rash.  Neurological:     Mental Status: He is alert and oriented to person, place, and time.     Cranial Nerves: No cranial nerve deficit.     Motor: No abnormal muscle tone.     Coordination: Coordination normal.     Deep Tendon Reflexes: Reflexes  are normal and symmetric. Reflexes normal.  Psychiatric:        Behavior: Behavior normal.        Thought Content: Thought content normal.        Judgment: Judgment normal.           Assessment & Plan:  Well exam. We discussed diet and exercise. Get fasting labs. He will use Robaxin and Ibuprofen for the sciatica as needed. Gershon Crane, MD

## 2018-02-19 NOTE — Addendum Note (Signed)
Addended by: Gershon Crane A on: 02/19/2018 08:03 AM   Modules accepted: Orders

## 2018-02-24 ENCOUNTER — Encounter: Payer: Self-pay | Admitting: Internal Medicine

## 2018-02-25 MED ORDER — POTASSIUM CHLORIDE ER 10 MEQ PO TBCR: 10.0000 meq | EXTENDED_RELEASE_TABLET | Freq: Every day | ORAL | 3 refills | Status: DC

## 2018-02-25 NOTE — Addendum Note (Signed)
Addended by: Johnella Moloney on: 02/25/2018 01:32 PM   Modules accepted: Orders

## 2018-03-05 ENCOUNTER — Telehealth: Payer: Self-pay

## 2018-03-05 ENCOUNTER — Encounter: Payer: Self-pay | Admitting: Endocrinology

## 2018-03-05 ENCOUNTER — Ambulatory Visit (INDEPENDENT_AMBULATORY_CARE_PROVIDER_SITE_OTHER): Payer: PRIVATE HEALTH INSURANCE | Admitting: Endocrinology

## 2018-03-05 ENCOUNTER — Other Ambulatory Visit: Payer: Self-pay

## 2018-03-05 VITALS — BP 146/68 | HR 100 | Ht 72.0 in | Wt 331.0 lb

## 2018-03-05 DIAGNOSIS — E119 Type 2 diabetes mellitus without complications: Secondary | ICD-10-CM

## 2018-03-05 LAB — BASIC METABOLIC PANEL
BUN: 11 mg/dL (ref 6–23)
CO2: 29 mEq/L (ref 19–32)
Calcium: 9.3 mg/dL (ref 8.4–10.5)
Chloride: 99 mEq/L (ref 96–112)
Creatinine, Ser: 0.79 mg/dL (ref 0.40–1.50)
GFR: 109.61 mL/min (ref >60.00)
GLUCOSE: 166 mg/dL — AB (ref 70–99)
Potassium: 3.7 mEq/L (ref 3.5–5.1)
Sodium: 135 mEq/L (ref 135–145)

## 2018-03-05 MED ORDER — DULAGLUTIDE 1.5 MG/0.5ML ~~LOC~~ SOAJ: 1.5000 mg | SUBCUTANEOUS | 11 refills | Status: DC

## 2018-03-05 MED ORDER — DAPAGLIFLOZIN PROPANEDIOL 5 MG PO TABS: 5.0000 mg | ORAL_TABLET | Freq: Every day | ORAL | 11 refills | Status: DC

## 2018-03-05 MED ORDER — GLUCOSE BLOOD VI STRP: 1.0000 | ORAL_STRIP | Freq: Every day | 12 refills | Status: AC

## 2018-03-05 NOTE — Telephone Encounter (Signed)
PA's initiated today through Cover My Meds for Trulicity and Farxiga (indicate medication). Will await insurance response re: approval/denial.  Robert [employer] Key: [id number] Rx #: [id number]  Need help? Call us at [phone]  Status: Sent to Plan today  Drug: Marcelline Deist 5MG  tablets  Form: Ambetter from [employer] Prior Authorization Form ([employer]) (CB)  Original Claim Info75   Robert Bellow (Key: [id number])  Rx #: [id number]  Trulicity 1.5MG /0.5ML pen-injectors  Form: Ambetter from [employer] Prior Authorization Form ([employer]) (CB)  Created: 3 hours ago  Sent to Plan: 2 minutes ago  Plan Response: 2 minutes ago  Submit Clinical Questions: less than a minute ago  Determination: Wait for Determination Please wait for [employer] to return a determination.

## 2018-03-05 NOTE — Patient Instructions (Addendum)
Your blood pressure is high today.  Please see your primary care provider soon, to have it rechecked good diet and exercise significantly improve the control of your diabetes.  please let me know if you wish to be referred to a dietician.  high blood sugar is very risky to your health.  you should see an eye doctor and dentist every year.  It is very important to get all recommended vaccinations.  Controlling your blood pressure and cholesterol drastically reduces the damage diabetes does to your body.  Those who smoke should quit.  Please discuss these with your doctor.  check your blood sugar once a day.  vary the time of day when you check, between before the 3 meals, and at bedtime.  also check if you have symptoms of your blood sugar being too high or too low.  please keep a record of the readings and bring it to your next appointment here (or you can bring the meter itself).  You can write it on any piece of paper.  please call us sooner if your blood sugar goes below 70, or if you have a lot of readings over 200.   Here is a new meter.  I have sent a prescription to your pharmacy, for strips.   I have sent 2 prescriptions to your pharmacy: to add "Trulicity," and to add "Farxiga."  Blood tests are requested for you today.  We'll let you know about the results.  Please come back for a follow-up appointment in 2 months.      Bariatric Surgery You have so much to gain by losing weight.  You may have already tried every diet and exercise plan imaginable.  And, you may have sought advice from your family physician, too.   Sometimes, in spite of such diligent efforts, you may not be able to achieve long-term results by yourself.  In cases of severe obesity, bariatric or weight loss surgery is a proven method of achieving long-term weight control.  Our Services Our bariatric surgery programs offer our patients new hope and long-term weight-loss solution.  Since introducing our services in 2003, we  have conducted more than 2,400 successful procedures.  Our program is designated as a Investment banker, corporate by the Metabolic and Bariatric Surgery Accreditation and Quality Improvement Program (MBSAQIP), a Child psychotherapist that sets rigorous patient safety and outcome standards.  Our program is also designated as a Engineer, manufacturing systems by [employer].   Our exceptional weight-loss surgery team specializes in diagnosis, treatment, follow-up care, and ongoing support for our patients with severe weight loss challenges.  We currently offer laparoscopic sleeve gastrectomy, gastric bypass, and adjustable gastric band (LAP-BAND).    Attend our Bariatrics Seminar Choosing to undergo a bariatric procedure is a big decision, and one that should not be taken lightly.  You now have two options in how you learn about weight-loss surgery - in person or online.  Our objective is to ensure you have all of the information that you need to evaluate the advantages and obligations of this life changing procedure.  Please note that you are not alone in this process, and our experienced team is ready to assist and answer all of your questions.  There are several ways to register for a seminar (either on-line or in person): 1)  Call [phone] 2) Go on-line to [facility] and register for either type of seminar.  [email or web address]

## 2018-03-05 NOTE — Progress Notes (Signed)
Subjective:    Patient ID: Danny Bauer, male    DOB: 1980/03/30, 38 y.o.   MRN: [id number]  HPI pt is referred by Dr Clent RidgesFry, for diabetes.  Pt states DM was dx'ed in 2016; he has mild if any neuropathy of the lower extremities; he is unaware of any associated chronic complications; he has never been on insulin; pt says his diet and exercise are poor; he has never had pancreatitis, pancreatic surgery, severe hypoglycemia or DKA.  He takes 3 oral meds.  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   Past Medical History:  Diagnosis Date  . Diabetes mellitus without complication (HCC)   . GERD (gastroesophageal reflux disease)   . Hypertension   . Sleep apnea, obstructive    sees Dr. Suzanna ObeyJohn Byers     Past Surgical History:  Procedure Laterality Date  . NOSE SURGERY  2009   straighten nasal passage   . TOE AMPUTATION  2001   right great toe    Social History   Socioeconomic History  . Marital status: Legally Separated    Spouse name: Not on file  . Number of children: Not on file  . Years of education: Not on file  . Highest education level: Not on file  Occupational History  . Not on file  Social Needs  . Financial resource strain: Not on file  . Food insecurity:    Worry: Not on file    Inability: Not on file  . Transportation needs:    Medical: Not on file    Non-medical: Not on file  Tobacco Use  . Smoking status: Never Smoker  . Smokeless tobacco: Never Used  Substance and Sexual Activity  . Alcohol use: No  . Drug use: No  . Sexual activity: Not on file  Lifestyle  . Physical activity:    Days per week: Not on file    Minutes per session: Not on file  . Stress: Not on file  Relationships  . Social connections:    Talks on phone: Not on file    Gets together: Not on file    Attends religious service: Not on file    Active member of club or organization: Not on file    Attends meetings of clubs or organizations: Not on file    Relationship status: Not on  file  . Intimate partner violence:    Fear of current or ex partner: Not on file    Emotionally abused: Not on file    Physically abused: Not on file    Forced sexual activity: Not on file  Other Topics Concern  . Not on file  Social History Narrative  . Not on file    Current Outpatient Medications on File Prior to Visit  Medication Sig Dispense Refill  . allopurinol (ZYLOPRIM) 300 MG tablet Take 1 tablet (300 mg total) by mouth every morning. 90 tablet 3  . diltiazem (CARDIZEM) 90 MG tablet Take 1 tablet (90 mg total) by mouth daily. 90 tablet 3  . glipiZIDE (GLUCOTROL) 10 MG tablet Take 1 tablet (10 mg total) by mouth 2 (two) times daily before a meal. 180 tablet 3  . lisinopril-hydrochlorothiazide (PRINZIDE,ZESTORETIC) 20-25 MG tablet Take 1 tablet by mouth daily. 90 tablet 3  . metFORMIN (GLUCOPHAGE) 1000 MG tablet Take 1 tablet (1,000 mg total) by mouth 2 (two) times daily with a meal. 180 tablet 3  . methocarbamol (ROBAXIN) 500 MG tablet Take 1 tablet (500  mg total) by mouth every 6 (six) hours as needed for muscle spasms. 60 tablet 5  . omeprazole (PRILOSEC) 20 MG capsule Take 20 mg by mouth every morning.     . potassium chloride (KLOR-CON 10) 10 MEQ tablet Take 1 tablet (10 mEq total) by mouth daily. 90 tablet 3  . sitaGLIPtin (JANUVIA) 100 MG tablet Take 1 tablet (100 mg total) by mouth daily. 90 tablet 3   No current facility-administered medications on file prior to visit.     Allergies  Allergen Reactions  . Bee Venom Anaphylaxis  . Codeine Diarrhea  . Hydrocodone-Acetaminophen Diarrhea  . Prednisone Diarrhea    Family History  Problem Relation Age of Onset  . Heart failure Mother   . Heart disease Mother   . Diabetes Mother   . Heart attack Father   . Heart disease Father     BP (!) 146/68 (BP Location: Right Arm, Patient Position: Sitting, Cuff Size: Large)   Pulse 100   Ht 6' (1.829 m)   Wt (!) 331 lb (150.1 kg)   SpO2 95%   BMI 44.89 kg/m      Review of Systems denies weight loss, blurry vision, headache, chest pain, sob, n/v, muscle cramps, excessive diaphoresis, memory loss, depression, cold intolerance, rhinorrhea, and easy bruising.  He attributes frequent urination to HCTZ.       Objective:   Physical Exam VS: see vs page GEN: no distress.  Morbid obesity HEAD: head: no deformity eyes: no periorbital swelling, no proptosis external nose and ears are normal mouth: no lesion seen NECK: supple, thyroid is not enlarged CHEST WALL: no deformity LUNGS: clear to auscultation CV: reg rate and rhythm, no murmur ABD: abdomen is soft, nontender.  no hepatosplenomegaly.  not distended.  no hernia MUSCULOSKELETAL: muscle bulk and strength are grossly normal.  no obvious joint swelling.  gait is normal and steady EXTEMITIES: no deformity.  no ulcer on the feet.  feet are of normal color and temp.  2+ bilat leg edema.  There is bilateral onychomycosis of the toenails.  Right great toe is partially absent (old lawnmower accident).   PULSES: dorsalis pedis intact bilat.  no carotid bruit NEURO:  cn 2-12 grossly intact.   readily moves all 4's.  sensation is intact to touch on the feet.   SKIN:  Normal texture and temperature.  No rash or suspicious lesion is visible.   NODES:  None palpable at the neck PSYCH: alert, well-oriented.  Does not appear anxious nor depressed.  Lab Results  Component Value Date   HGBA1C 9.5 (H) 02/13/2018    Lab Results  Component Value Date   TSH 2.41 02/13/2018   I have reviewed outside records, and summarized: Pt was noted to have elevated a1c, and referred here.  Other probs addressed were sciatica and wellness.       Assessment & Plan:  Type 2 DM: she needs increased rx Edema: this limits rx options.  Occupational status: he needs to control DM without insulin Obesity: new to me HTN: is noted today  Patient Instructions  Your blood pressure is high today.  Please see your primary  care provider soon, to have it rechecked good diet and exercise significantly improve the control of your diabetes.  please let me know if you wish to be referred to a dietician.  high blood sugar is very risky to your health.  you should see an eye doctor and dentist every year.  It is very important  to get all recommended vaccinations.  Controlling your blood pressure and cholesterol drastically reduces the damage diabetes does to your body.  Those who smoke should quit.  Please discuss these with your doctor.  check your blood sugar once a day.  vary the time of day when you check, between before the 3 meals, and at bedtime.  also check if you have symptoms of your blood sugar being too high or too low.  please keep a record of the readings and bring it to your next appointment here (or you can bring the meter itself).  You can write it on any piece of paper.  please call us sooner if your blood sugar goes below 70, or if you have a lot of readings over 200.   Here is a new meter.  I have sent a prescription to your pharmacy, for strips.   I have sent 2 prescriptions to your pharmacy: to add "Trulicity," and to add "Farxiga."  Blood tests are requested for you today.  We'll let you know about the results.  Please come back for a follow-up appointment in 2 months.      Bariatric Surgery You have so much to gain by losing weight.  You may have already tried every diet and exercise plan imaginable.  And, you may have sought advice from your family physician, too.   Sometimes, in spite of such diligent efforts, you may not be able to achieve long-term results by yourself.  In cases of severe obesity, bariatric or weight loss surgery is a proven method of achieving long-term weight control.  Our Services Our bariatric surgery programs offer our patients new hope and long-term weight-loss solution.  Since introducing our services in 2003, we have conducted more than 2,400 successful procedures.  Our  program is designated as a Investment banker, corporate by the Metabolic and Bariatric Surgery Accreditation and Quality Improvement Program (MBSAQIP), a Child psychotherapist that sets rigorous patient safety and outcome standards.  Our program is also designated as a Engineer, manufacturing systems by [employer].   Our exceptional weight-loss surgery team specializes in diagnosis, treatment, follow-up care, and ongoing support for our patients with severe weight loss challenges.  We currently offer laparoscopic sleeve gastrectomy, gastric bypass, and adjustable gastric band (LAP-BAND).    Attend our Bariatrics Seminar Choosing to undergo a bariatric procedure is a big decision, and one that should not be taken lightly.  You now have two options in how you learn about weight-loss surgery - in person or online.  Our objective is to ensure you have all of the information that you need to evaluate the advantages and obligations of this life changing procedure.  Please note that you are not alone in this process, and our experienced team is ready to assist and answer all of your questions.  There are several ways to register for a seminar (either on-line or in person): 1)  Call [phone] 2) Go on-line to [facility] and register for either type of seminar.  [email or web address]

## 2018-03-06 NOTE — Telephone Encounter (Signed)
Received notification from [facility] that PA for Trulicity #[id number] has been approved 03/05/18 FOR THE NEXT 366 DAYS. Document has been labeled and placed in scan file for HIM and for our future reference.  PA for Marcelline Deist remains pending at this time.

## 2018-03-11 ENCOUNTER — Telehealth: Payer: Self-pay

## 2018-03-11 ENCOUNTER — Other Ambulatory Visit: Payer: Self-pay | Admitting: Endocrinology

## 2018-03-11 MED ORDER — ERTUGLIFLOZIN L-PYROGLUTAMICAC 5 MG PO TABS: 5.0000 mg | ORAL_TABLET | Freq: Every day | ORAL | 3 refills | Status: DC

## 2018-03-11 NOTE — Telephone Encounter (Signed)
Dr. Everardo All sent Rx for [facility] to pt pharmacy. Called pt to make him aware of approval and new Rx. LVM requesting returned call.

## 2018-03-11 NOTE — Telephone Encounter (Signed)
PA initiated today through Cover My Meds for [employer]. Will await insurance response re: approval/denial.  [employer] Key: [id number] Need help? Call us at [phone]  Status: Sent to Plan today  Drug: Steglatro 5MG  tablets  Form: Actor from [employer] Prior Authorization Form ([employer]) (CB)

## 2018-03-11 NOTE — Telephone Encounter (Signed)
Received notification from [employer] that PA for Steglatro has been approved 03/11/18 through 03/13/19. Documents have been labeled and placed in scan file for HIM and for our future reference.

## 2018-03-17 ENCOUNTER — Telehealth: Payer: Self-pay | Admitting: Endocrinology

## 2018-03-17 NOTE — Telephone Encounter (Signed)
Danny Bauer with Cover My Meds ph# [phone] called re: PA for Trulicity 1.5 mg. Reference# [id number] (confirm deletion)

## 2018-03-17 NOTE — Telephone Encounter (Signed)
Closed as erroneous. This medication has been approved with documentation received to support. No call back required. 

## 2018-05-04 ENCOUNTER — Encounter: Payer: Self-pay | Admitting: Endocrinology

## 2018-05-05 ENCOUNTER — Ambulatory Visit (INDEPENDENT_AMBULATORY_CARE_PROVIDER_SITE_OTHER): Payer: PRIVATE HEALTH INSURANCE | Admitting: Endocrinology

## 2018-05-05 ENCOUNTER — Other Ambulatory Visit: Payer: Self-pay

## 2018-05-05 DIAGNOSIS — E119 Type 2 diabetes mellitus without complications: Secondary | ICD-10-CM | POA: Diagnosis not present

## 2018-05-05 MED ORDER — GLIPIZIDE 10 MG PO TABS: 10.0000 mg | ORAL_TABLET | Freq: Every day | ORAL | 3 refills | Status: DC

## 2018-05-05 MED ORDER — ERTUGLIFLOZIN L-PYROGLUTAMICAC 15 MG PO TABS: 15.0000 mg | ORAL_TABLET | Freq: Every day | ORAL | 3 refills | Status: DC

## 2018-05-05 NOTE — Progress Notes (Signed)
Subjective:    Patient ID: Danny Bauer, male    DOB: [birth year], 38 y.o.   MRN: [id number]  HPI telehealth visit today via doxy video visit.  Alternatives to telehealth are presented to this patient, and the patient agrees to the telehealth visit. Pt is advised of the cost of the visit, and agrees to this, also.   Patient is at home, and I am at the office.   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  Interval history: He says cbg varies from 99-143.  pt states he feels well in general.  He takes meds as rx'ed.  Past Medical History:  Diagnosis Date  . Diabetes mellitus without complication (HCC)   . GERD (gastroesophageal reflux disease)   . Hypertension   . Sleep apnea, obstructive    sees Dr. Suzanna ObeyJohn Byers     Past Surgical History:  Procedure Laterality Date  . NOSE SURGERY  2009   straighten nasal passage   . TOE AMPUTATION  2001   right great toe    Social History   Socioeconomic History  . Marital status: Legally Separated    Spouse name: Not on file  . Number of children: Not on file  . Years of education: Not on file  . Highest education level: Not on file  Occupational History  . Not on file  Social Needs  . Financial resource strain: Not on file  . Food insecurity:    Worry: Not on file    Inability: Not on file  . Transportation needs:    Medical: Not on file    Non-medical: Not on file  Tobacco Use  . Smoking status: Never Smoker  . Smokeless tobacco: Never Used  Substance and Sexual Activity  . Alcohol use: No  . Drug use: No  . Sexual activity: Not on file  Lifestyle  . Physical activity:    Days per week: Not on file    Minutes per session: Not on file  . Stress: Not on file  Relationships  .  Social connections:    Talks on phone: Not on file    Gets together: Not on file    Attends religious service: Not on file    Active member of club or organization: Not on file    Attends meetings of clubs or organizations: Not on file    Relationship status: Not on file  . Intimate partner violence:    Fear of current or ex partner: Not on file    Emotionally abused: Not on file    Physically abused: Not on file    Forced sexual activity: Not on file  Other Topics Concern  . Not on file  Social History Narrative  . Not on file    Current Outpatient Medications on File Prior to Visit  Medication Sig Dispense Refill  . allopurinol (ZYLOPRIM) 300 MG tablet Take 1 tablet (300 mg total) by mouth every morning. 90 tablet 3  . diltiazem (CARDIZEM) 90 MG tablet Take 1 tablet (90 mg total) by mouth daily. 90 tablet 3  . Dulaglutide (TRULICITY) 1.5 MG/0.5ML SOPN Inject 1.5 mg into the skin once a week. 4 pen 11  . glucose blood (ONETOUCH VERIO) test strip 1 each by Other route  daily. And lancets 1/day 100 each 12  . lisinopril-hydrochlorothiazide (PRINZIDE,ZESTORETIC) 20-25 MG tablet Take 1 tablet by mouth daily. 90 tablet 3  . metFORMIN (GLUCOPHAGE) 1000 MG tablet Take 1 tablet (1,000 mg total) by mouth 2 (two) times daily with a meal. 180 tablet 3  . methocarbamol (ROBAXIN) 500 MG tablet Take 1 tablet (500 mg total) by mouth every 6 (six) hours as needed for muscle spasms. 60 tablet 5  . omeprazole (PRILOSEC) 20 MG capsule Take 20 mg by mouth every morning.     . potassium chloride (KLOR-CON 10) 10 MEQ tablet Take 1 tablet (10 mEq total) by mouth daily. 90 tablet 3  . sitaGLIPtin (JANUVIA) 100 MG tablet Take 1 tablet (100 mg total) by mouth daily. 90 tablet 3   No current facility-administered medications on file prior to visit.     Allergies  Allergen Reactions  . Bee Venom Anaphylaxis  . Codeine Diarrhea  . Hydrocodone-Acetaminophen Diarrhea  . Prednisone Diarrhea    Family  History  Problem Relation Age of Onset  . Heart failure Mother   . Heart disease Mother   . Diabetes Mother   . Heart attack Father   . Heart disease Father    Review of Systems He denies hypoglycemia    Objective:   Physical Exam   Lab Results  Component Value Date   CREATININE 0.79 03/05/2018   BUN 11 03/05/2018   NA 135 03/05/2018   K 3.7 03/05/2018   CL 99 03/05/2018   CO2 29 03/05/2018      Assessment & Plan:  Type 2 DM: goal is to minimize or d/c glipizide.    Patient Instructions  check your blood sugar once a day.  vary the time of day when you check, between before the 3 meals, and at bedtime.  also check if you have symptoms of your blood sugar being too high or too low.  please keep a record of the readings and bring it to your next appointment here (or you can bring the meter itself).  You can write it on any piece of paper.  please call us sooner if your blood sugar goes below 70, or if you have a lot of readings over 200.   I have sent a prescription to your pharmacy, to increase the [facility].   Please also reduce the glipizide to 1 pill each morning.  Please continue the same other diabetes medications.   Please come back for a follow-up appointment in 1 month.  We'll plan to check kidney blood tests then.

## 2018-05-05 NOTE — Patient Instructions (Addendum)
check your blood sugar once a day.  vary the time of day when you check, between before the 3 meals, and at bedtime.  also check if you have symptoms of your blood sugar being too high or too low.  please keep a record of the readings and bring it to your next appointment here (or you can bring the meter itself).  You can write it on any piece of paper.  please call us sooner if your blood sugar goes below 70, or if you have a lot of readings over 200.   I have sent a prescription to your pharmacy, to increase the [facility].   Please also reduce the glipizide to 1 pill each morning.  Please continue the same other diabetes medications.   Please come back for a follow-up appointment in 1 month.  We'll plan to check kidney blood tests then.

## 2018-05-11 ENCOUNTER — Other Ambulatory Visit: Payer: Self-pay | Admitting: Family Medicine

## 2018-05-11 NOTE — Telephone Encounter (Signed)
Requested medication (s) are due for refill today: Yes  Requested medication (s) are on the active medication list: Yes  Last refill:  02/13/18  Future visit scheduled: No  Notes to clinic:  See request    Requested Prescriptions  Pending Prescriptions Disp Refills   methocarbamol (ROBAXIN) 500 MG tablet 60 tablet 5    Sig: Take 1 tablet (500 mg total) by mouth every 6 (six) hours as needed for muscle spasms.     Not Delegated - Analgesics:  Muscle Relaxants Failed - 05/11/2018 11:38 AM      Failed - This refill cannot be delegated      Passed - Valid encounter within last 6 months    Recent Outpatient Visits          2 months ago Preventative health care   [facility] [place], Tera Mater, MD   1 year ago Preventative health care   [facility] [place], Tera Mater, MD   3 years ago Preventative health care   [facility] [place], Tera Mater, MD   3 years ago Risk manager, left   [facility], Neta Mends, MD   3 years ago Wrist strain, left, subsequent encounter   [facility], Myrle Sheng, MD           Jeannie Fend

## 2018-05-20 MED ORDER — METHOCARBAMOL 500 MG PO TABS: 500.0000 mg | ORAL_TABLET | Freq: Four times a day (QID) | ORAL | 0 refills | Status: DC | PRN

## 2018-06-03 ENCOUNTER — Encounter: Payer: Self-pay | Admitting: Endocrinology

## 2018-06-04 ENCOUNTER — Ambulatory Visit (INDEPENDENT_AMBULATORY_CARE_PROVIDER_SITE_OTHER): Payer: PRIVATE HEALTH INSURANCE | Admitting: Endocrinology

## 2018-06-04 ENCOUNTER — Other Ambulatory Visit: Payer: Self-pay

## 2018-06-04 DIAGNOSIS — E119 Type 2 diabetes mellitus without complications: Secondary | ICD-10-CM | POA: Diagnosis not present

## 2018-06-04 DIAGNOSIS — R609 Edema, unspecified: Secondary | ICD-10-CM

## 2018-06-04 NOTE — Patient Instructions (Addendum)
check your blood sugar once a day.  vary the time of day when you check, between before the 3 meals, and at bedtime.  also check if you have symptoms of your blood sugar being too high or too low.  please keep a record of the readings and bring it to your next appointment here (or you can bring the meter itself).  You can write it on any piece of paper.  please call us sooner if your blood sugar goes below 70, or if you have a lot of readings over 200.   Please do the a1c at the  office. Our goals are to get the a1c to 7, and minimize the glipizide. Please come back for a follow-up appointment in 2 months.

## 2018-06-04 NOTE — Progress Notes (Signed)
Subjective:    Patient ID: Danny Bauer, male    DOB: 09/18/1980, 38 y.o.   MRN: [id number]  HPI telehealth visit today via doxy video visit.  Alternatives to telehealth are presented to this patient, and the patient agrees to the telehealth visit. Pt is advised of the cost of the visit, and agrees to this, also.   Patient is at home, and I am at the office.   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   Interval history: He says cbg varies from 100-130.  pt states he feels well in general.  He takes meds as rx'ed.   Past Medical History:  Diagnosis Date  . Diabetes mellitus without complication (HCC)   . GERD (gastroesophageal reflux disease)   . Hypertension   . Sleep apnea, obstructive    sees Dr. Suzanna Obey     Past Surgical History:  Procedure Laterality Date  . NOSE SURGERY  2009   straighten nasal passage   . TOE AMPUTATION  2001   right great toe    Social History   Socioeconomic History  . Marital status: Legally Separated    Spouse name: Not on file  . Number of children: Not on file  . Years of education: Not on file  . Highest education level: Not on file  Occupational History  . Not on file  Social Needs  . Financial resource strain: Not on file  . Food insecurity:    Worry: Not on file    Inability: Not on file  . Transportation needs:    Medical: Not on file    Non-medical: Not on file  Tobacco Use  . Smoking status: Never Smoker  . Smokeless tobacco: Never Used  Substance and Sexual Activity  . Alcohol use: No  . Drug use: No  . Sexual activity: Not on file  Lifestyle  . Physical activity:    Days per week: Not on file    Minutes per session: Not on file  . Stress: Not  on file  Relationships  . Social connections:    Talks on phone: Not on file    Gets together: Not on file    Attends religious service: Not on file    Active member of club or organization: Not on file    Attends meetings of clubs or organizations: Not on file    Relationship status: Not on file  . Intimate partner violence:    Fear of current or ex partner: Not on file    Emotionally abused: Not on file    Physically abused: Not on file    Forced sexual activity: Not on file  Other Topics Concern  . Not on file  Social History Narrative  . Not on file    Current Outpatient Medications on File Prior to Visit  Medication Sig Dispense Refill  . allopurinol (ZYLOPRIM) 300 MG tablet Take 1 tablet (300 mg total) by mouth every morning. 90 tablet 3  . diltiazem (CARDIZEM) 90 MG tablet Take 1 tablet (90 mg total) by mouth daily. 90 tablet 3  . Dulaglutide (TRULICITY) 1.5 MG/0.5ML SOPN Inject 1.5 mg into the skin once a week. 4 pen 11  . Ertugliflozin L-PyroglutamicAc (STEGLATRO) 15 MG  TABS Take 15 mg by mouth daily. 90 tablet 3  . glipiZIDE (GLUCOTROL) 10 MG tablet Take 1 tablet (10 mg total) by mouth daily before breakfast. 90 tablet 3  . glucose blood (ONETOUCH VERIO) test strip 1 each by Other route daily. And lancets 1/day 100 each 12  . lisinopril-hydrochlorothiazide (PRINZIDE,ZESTORETIC) 20-25 MG tablet Take 1 tablet by mouth daily. 90 tablet 3  . metFORMIN (GLUCOPHAGE) 1000 MG tablet Take 1 tablet (1,000 mg total) by mouth 2 (two) times daily with a meal. 180 tablet 3  . methocarbamol (ROBAXIN) 500 MG tablet Take 1 tablet (500 mg total) by mouth every 6 (six) hours as needed for muscle spasms. 60 tablet 0  . omeprazole (PRILOSEC) 20 MG capsule Take 20 mg by mouth every morning.     . potassium chloride (KLOR-CON 10) 10 MEQ tablet Take 1 tablet (10 mEq total) by mouth daily. 90 tablet 3  . sitaGLIPtin (JANUVIA) 100 MG tablet Take 1 tablet (100 mg total) by mouth daily. 90 tablet 3    No current facility-administered medications on file prior to visit.     Allergies  Allergen Reactions  . Bee Venom Anaphylaxis  . Codeine Diarrhea  . Hydrocodone-Acetaminophen Diarrhea  . Prednisone Diarrhea    Family History  Problem Relation Age of Onset  . Heart failure Mother   . Heart disease Mother   . Diabetes Mother   . Heart attack Father   . Heart disease Father      Review of Systems He denies hypoglycemia    Objective:   Physical Exam      Assessment & Plan:  Type 2 DM: uncertain glycemic control.  He declines A1c now.  Edema: This limits rx options  Patient Instructions  check your blood sugar once a day.  vary the time of day when you check, between before the 3 meals, and at bedtime.  also check if you have symptoms of your blood sugar being too high or too low.  please keep a record of the readings and bring it to your next appointment here (or you can bring the meter itself).  You can write it on any piece of paper.  please call us sooner if your blood sugar goes below 70, or if you have a lot of readings over 200.   Please do the a1c at the [place] office. Our goals are to get the a1c to 7, and minimize the glipizide. Please come back for a follow-up appointment in 2 months.

## 2018-08-05 ENCOUNTER — Other Ambulatory Visit: Payer: Self-pay

## 2018-08-05 ENCOUNTER — Other Ambulatory Visit (INDEPENDENT_AMBULATORY_CARE_PROVIDER_SITE_OTHER): Payer: PRIVATE HEALTH INSURANCE

## 2018-08-05 DIAGNOSIS — E119 Type 2 diabetes mellitus without complications: Secondary | ICD-10-CM

## 2018-08-05 LAB — BASIC METABOLIC PANEL
BUN: 12 mg/dL (ref 6–23)
CO2: 27 mEq/L (ref 19–32)
Calcium: 9.5 mg/dL (ref 8.4–10.5)
Chloride: 98 mEq/L (ref 96–112)
Creatinine, Ser: 0.87 mg/dL (ref 0.40–1.50)
GFR: 97.85 mL/min (ref >60.00)
Glucose, Bld: 192 mg/dL — ABNORMAL HIGH (ref 70–99)
Potassium: 3.6 mEq/L (ref 3.5–5.1)
Sodium: 135 mEq/L (ref 135–145)

## 2018-08-05 LAB — HEMOGLOBIN A1C: Hgb A1c MFr Bld: 6.8 % — ABNORMAL HIGH (ref 4.6–6.5)

## 2018-08-06 ENCOUNTER — Telehealth: Payer: Self-pay

## 2018-08-06 NOTE — Telephone Encounter (Signed)
-----   Message from Renato Shin, MD sent at 08/05/2018  7:22 PM EDT ----- please contact patient: a1c is pretty good, but we should meet about this, because we need to make med adjustments.  doxy is ok.

## 2018-08-06 NOTE — Telephone Encounter (Signed)
At Dr. Cordelia Pen request, called pt to schedule VV. Received most recent A1C results and would like to further discuss with pt. LVM requesting returned call.

## 2018-08-07 ENCOUNTER — Other Ambulatory Visit: Payer: Self-pay

## 2018-08-07 ENCOUNTER — Ambulatory Visit (INDEPENDENT_AMBULATORY_CARE_PROVIDER_SITE_OTHER): Payer: PRIVATE HEALTH INSURANCE | Admitting: Endocrinology

## 2018-08-07 ENCOUNTER — Encounter: Payer: Self-pay | Admitting: Endocrinology

## 2018-08-07 DIAGNOSIS — E119 Type 2 diabetes mellitus without complications: Secondary | ICD-10-CM | POA: Diagnosis not present

## 2018-08-07 MED ORDER — GLIPIZIDE 5 MG PO TABS: 5.0000 mg | ORAL_TABLET | Freq: Every day | ORAL | 3 refills | Status: DC

## 2018-08-07 NOTE — Progress Notes (Signed)
Pt scheduled for VV today (08/07/18)

## 2018-08-07 NOTE — Patient Instructions (Signed)
I have sent a prescription to your pharmacy, to reduce the glipizide. Please continue the same other diabetes medications. check your blood sugar once a day.  vary the time of day when you check, between before the 3 meals, and at bedtime.  also check if you have symptoms of your blood sugar being too high or too low.  please keep a record of the readings and bring it to your next appointment here (or you can bring the meter itself).  You can write it on any piece of paper.  please call us sooner if your blood sugar goes below 70, or if you have a lot of readings over 200. Please come back for a follow-up appointment in 3-4 months.

## 2018-08-07 NOTE — Progress Notes (Signed)
Subjective:    Patient ID: Danny Bauer, male    DOB: [birth year], 38 y.o.   MRN: [id number]  HPI Pt returns for f/u of diabetes mellitus: DM type: 2 Dx'ed: 2016 Complications: none Therapy: trulicity and 4 oral meds.   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   Interval history: He says cbg's are in the 100's.  pt states he feels well in general.  He takes meds as rx'ed.   Past Medical History:  Diagnosis Date  . Diabetes mellitus without complication (HCC)   . GERD (gastroesophageal reflux disease)   . Hypertension   . Sleep apnea, obstructive    sees Dr. Suzanna ObeyJohn Byers     Past Surgical History:  Procedure Laterality Date  . NOSE SURGERY  2009   straighten nasal passage   . TOE AMPUTATION  2001   right great toe    Social History   Socioeconomic History  . Marital status: Legally Separated    Spouse name: Not on file  . Number of children: Not on file  . Years of education: Not on file  . Highest education level: Not on file  Occupational History  . Not on file  Social Needs  . Financial resource strain: Not on file  . Food insecurity    Worry: Not on file    Inability: Not on file  . Transportation needs    Medical: Not on file    Non-medical: Not on file  Tobacco Use  . Smoking status: Never Smoker  . Smokeless tobacco: Never Used  Substance and Sexual Activity  . Alcohol use: No  . Drug use: No  . Sexual activity: Not on file  Lifestyle  . Physical activity    Days per week: Not on file    Minutes per session: Not on file  . Stress: Not on file  Relationships  . Social Musicianconnections    Talks on phone: Not on file    Gets together: Not on file    Attends religious service: Not on file    Active member of club or organization: Not on file    Attends meetings of clubs or organizations: Not on file    Relationship status: Not on file  . Intimate partner violence    Fear of current or ex partner: Not on file    Emotionally abused: Not on file    Physically abused: Not on file    Forced sexual activity: Not on file  Other Topics Concern  . Not on file  Social History Narrative  . Not on file    Current Outpatient Medications on File Prior to Visit  Medication Sig Dispense Refill  . allopurinol (ZYLOPRIM) 300 MG tablet Take 1 tablet (300 mg total) by mouth every morning. 90 tablet 3  . diltiazem (CARDIZEM) 90 MG tablet Take 1 tablet (90 mg total) by mouth daily. 90 tablet 3  . Dulaglutide (TRULICITY) 1.5 MG/0.5ML SOPN Inject 1.5 mg into the skin once a week. 4 pen 11  . Ertugliflozin L-PyroglutamicAc (STEGLATRO) 15 MG TABS Take 15 mg by mouth daily. 90 tablet 3  . glucose blood (ONETOUCH VERIO) test strip 1 each by Other route daily. And lancets 1/day 100 each 12  . lisinopril-hydrochlorothiazide (PRINZIDE,ZESTORETIC) 20-25 MG tablet Take 1 tablet by mouth daily. 90 tablet 3  . metFORMIN (GLUCOPHAGE)  1000 MG tablet Take 1 tablet (1,000 mg total) by mouth 2 (two) times daily with a meal. 180 tablet 3  . methocarbamol (ROBAXIN) 500 MG tablet Take 1 tablet (500 mg total) by mouth every 6 (six) hours as needed for muscle spasms. 60 tablet 0  . omeprazole (PRILOSEC) 20 MG capsule Take 20 mg by mouth every morning.     . potassium chloride (KLOR-CON 10) 10 MEQ tablet Take 1 tablet (10 mEq total) by mouth daily. 90 tablet 3  . sitaGLIPtin (JANUVIA) 100 MG tablet Take 1 tablet (100 mg total) by mouth daily. 90 tablet 3   No current facility-administered medications on file prior to visit.     Allergies  Allergen Reactions  . Bee Venom Anaphylaxis  . Codeine Diarrhea  . Hydrocodone-Acetaminophen Diarrhea  . Prednisone Diarrhea    Family History  Problem Relation Age of Onset  . Heart failure Mother   . Heart disease Mother   . Diabetes Mother   . Heart attack Father   . Heart  disease Father      Review of Systems He denies hypoglycemia.      Objective:   Physical Exam   Lab Results  Component Value Date   HGBA1C 6.8 (H) 08/05/2018   Lab Results  Component Value Date   CREATININE 0.87 08/05/2018   BUN 12 08/05/2018   NA 135 08/05/2018   K 3.6 08/05/2018   CL 98 08/05/2018   CO2 27 08/05/2018      Assessment & Plan:  Type 2 DM: Overcontrolled, given this SU-containing regimen Occupational status: he needs to control DM without insulin.    Patient Instructions  I have sent a prescription to your pharmacy, to reduce the glipizide. Please continue the same other diabetes medications. check your blood sugar once a day.  vary the time of day when you check, between before the 3 meals, and at bedtime.  also check if you have symptoms of your blood sugar being too high or too low.  please keep a record of the readings and bring it to your next appointment here (or you can bring the meter itself).  You can write it on any piece of paper.  please call us sooner if your blood sugar goes below 70, or if you have a lot of readings over 200. Please come back for a follow-up appointment in 3-4 months.

## 2018-09-18 ENCOUNTER — Ambulatory Visit: Payer: Self-pay | Admitting: Family Medicine

## 2019-02-25 ENCOUNTER — Ambulatory Visit: Payer: Medicaid Other | Admitting: Gerontology

## 2019-02-25 ENCOUNTER — Other Ambulatory Visit: Payer: Self-pay

## 2019-02-25 ENCOUNTER — Encounter: Payer: Self-pay | Admitting: Gerontology

## 2019-02-25 VITALS — BP 140/76 | HR 86 | Ht 74.0 in | Wt 379.0 lb

## 2019-02-25 DIAGNOSIS — Z7689 Persons encountering health services in other specified circumstances: Secondary | ICD-10-CM

## 2019-02-25 DIAGNOSIS — M25561 Pain in right knee: Secondary | ICD-10-CM | POA: Insufficient documentation

## 2019-02-25 DIAGNOSIS — G4730 Sleep apnea, unspecified: Secondary | ICD-10-CM

## 2019-02-25 DIAGNOSIS — K219 Gastro-esophageal reflux disease without esophagitis: Secondary | ICD-10-CM

## 2019-02-25 DIAGNOSIS — E119 Type 2 diabetes mellitus without complications: Secondary | ICD-10-CM

## 2019-02-25 DIAGNOSIS — G8929 Other chronic pain: Secondary | ICD-10-CM

## 2019-02-25 DIAGNOSIS — I10 Essential (primary) hypertension: Secondary | ICD-10-CM

## 2019-02-25 MED ORDER — TRULICITY 1.5 MG/0.5ML ~~LOC~~ SOAJ: 1.5000 mg | SUBCUTANEOUS | 11 refills | Status: DC

## 2019-02-25 MED ORDER — GLIPIZIDE 5 MG PO TABS: 5.0000 mg | ORAL_TABLET | Freq: Every day | ORAL | 1 refills | Status: DC

## 2019-02-25 MED ORDER — METHOCARBAMOL 500 MG PO TABS: 500.0000 mg | ORAL_TABLET | Freq: Three times a day (TID) | ORAL | 0 refills | Status: DC | PRN

## 2019-02-25 MED ORDER — METFORMIN HCL 1000 MG PO TABS: 1000.0000 mg | ORAL_TABLET | Freq: Two times a day (BID) | ORAL | 1 refills | Status: AC

## 2019-02-25 MED ORDER — POTASSIUM CHLORIDE ER 10 MEQ PO TBCR: 10.0000 meq | EXTENDED_RELEASE_TABLET | Freq: Every day | ORAL | 1 refills | Status: DC

## 2019-02-25 MED ORDER — OMEPRAZOLE 20 MG PO CPDR: 20.0000 mg | DELAYED_RELEASE_CAPSULE | Freq: Every morning | ORAL | 1 refills | Status: DC

## 2019-02-25 MED ORDER — BLOOD GLUCOSE MONITOR KIT: PACK | 0 refills | Status: AC

## 2019-02-25 MED ORDER — LISINOPRIL-HYDROCHLOROTHIAZIDE 20-25 MG PO TABS: 1.0000 | ORAL_TABLET | Freq: Every day | ORAL | 1 refills | Status: DC

## 2019-02-25 MED ORDER — DILTIAZEM HCL 90 MG PO TABS: 90.0000 mg | ORAL_TABLET | Freq: Every day | ORAL | 1 refills | Status: DC

## 2019-02-25 NOTE — Patient Instructions (Signed)
Calorie Counting for Weight Loss Calories are units of energy. Your body needs a certain amount of calories from food to keep you going throughout the day. When you eat more calories than your body needs, your body stores the extra calories as fat. When you eat fewer calories than your body needs, your body burns fat to get the energy it needs. Calorie counting means keeping track of how many calories you eat and drink each day. Calorie counting can be helpful if you need to lose weight. If you make sure to eat fewer calories than your body needs, you should lose weight. Ask your health care provider what a healthy weight is for you. For calorie counting to work, you will need to eat the right number of calories in a day in order to lose a healthy amount of weight per week. A dietitian can help you determine how many calories you need in a day and will give you suggestions on how to reach your calorie goal.  A healthy amount of weight to lose per week is usually 1-2 lb (0.5-0.9 kg). This usually means that your daily calorie intake should be reduced by 500-750 calories.  Eating 1,200 - 1,500 calories per day can help most women lose weight.  Eating 1,500 - 1,800 calories per day can help most men lose weight. What is my plan? My goal is to have __________ calories per day. If I have this many calories per day, I should lose around __________ pounds per week. What do I need to know about calorie counting? In order to meet your daily calorie goal, you will need to:  Find out how many calories are in each food you would like to eat. Try to do this before you eat.  Decide how much of the food you plan to eat.  Write down what you ate and how many calories it had. Doing this is called keeping a food log. To successfully lose weight, it is important to balance calorie counting with a healthy lifestyle that includes regular activity. Aim for 150 minutes of moderate exercise (such as walking) or 75  minutes of vigorous exercise (such as running) each week. Where do I find calorie information?  The number of calories in a food can be found on a Nutrition Facts label. If a food does not have a Nutrition Facts label, try to look up the calories online or ask your dietitian for help. Remember that calories are listed per serving. If you choose to have more than one serving of a food, you will have to multiply the calories per serving by the amount of servings you plan to eat. For example, the label on a package of bread might say that a serving size is 1 slice and that there are 90 calories in a serving. If you eat 1 slice, you will have eaten 90 calories. If you eat 2 slices, you will have eaten 180 calories. How do I keep a food log? Immediately after each meal, record the following information in your food log:  What you ate. Don't forget to include toppings, sauces, and other extras on the food.  How much you ate. This can be measured in cups, ounces, or number of items.  How many calories each food and drink had.  The total number of calories in the meal. Keep your food log near you, such as in a small notebook in your pocket, or use a mobile app or website. Some programs will calculate   calories for you and show you how many calories you have left for the day to meet your goal. What are some calorie counting tips?   Use your calories on foods and drinks that will fill you up and not leave you hungry: ? Some examples of foods that fill you up are nuts and nut butters, vegetables, lean proteins, and high-fiber foods like whole grains. High-fiber foods are foods with more than 5 g fiber per serving. ? Drinks such as sodas, specialty coffee drinks, alcohol, and juices have a lot of calories, yet do not fill you up.  Eat nutritious foods and avoid empty calories. Empty calories are calories you get from foods or beverages that do not have many vitamins or protein, such as candy, sweets, and  soda. It is better to have a nutritious high-calorie food (such as an avocado) than a food with few nutrients (such as a bag of chips).  Know how many calories are in the foods you eat most often. This will help you calculate calorie counts faster.  Pay attention to calories in drinks. Low-calorie drinks include water and unsweetened drinks.  Pay attention to nutrition labels for "low fat" or "fat free" foods. These foods sometimes have the same amount of calories or more calories than the full fat versions. They also often have added sugar, starch, or salt, to make up for flavor that was removed with the fat.  Find a way of tracking calories that works for you. Get creative. Try different apps or programs if writing down calories does not work for you. What are some portion control tips?  Know how many calories are in a serving. This will help you know how many servings of a certain food you can have.  Use a measuring cup to measure serving sizes. You could also try weighing out portions on a kitchen scale. With time, you will be able to estimate serving sizes for some foods.  Take some time to put servings of different foods on your favorite plates, bowls, and cups so you know what a serving looks like.  Try not to eat straight from a bag or box. Doing this can lead to overeating. Put the amount you would like to eat in a cup or on a plate to make sure you are eating the right portion.  Use smaller plates, glasses, and bowls to prevent overeating.  Try not to multitask (for example, watch TV or use your computer) while eating. If it is time to eat, sit down at a table and enjoy your food. This will help you to know when you are full. It will also help you to be aware of what you are eating and how much you are eating. What are tips for following this plan? Reading food labels  Check the calorie count compared to the serving size. The serving size may be smaller than what you are used to  eating.  Check the source of the calories. Make sure the food you are eating is high in vitamins and protein and low in saturated and trans fats. Shopping  Read nutrition labels while you shop. This will help you make healthy decisions before you decide to purchase your food.  Make a grocery list and stick to it. Cooking  Try to cook your favorite foods in a healthier way. For example, try baking instead of frying.  Use low-fat dairy products. Meal planning  Use more fruits and vegetables. Half of your plate should be fruits   and vegetables.  Include lean proteins like poultry and fish. How do I count calories when eating out?  Ask for smaller portion sizes.  Consider sharing an entree and sides instead of getting your own entree.  If you get your own entree, eat only half. Ask for a box at the beginning of your meal and put the rest of your entree in it so you are not tempted to eat it.  If calories are listed on the menu, choose the lower calorie options.  Choose dishes that include vegetables, fruits, whole grains, low-fat dairy products, and lean protein.  Choose items that are boiled, broiled, grilled, or steamed. Stay away from items that are buttered, battered, fried, or served with cream sauce. Items labeled "crispy" are usually fried, unless stated otherwise.  Choose water, low-fat milk, unsweetened iced tea, or other drinks without added sugar. If you want an alcoholic beverage, choose a lower calorie option such as a glass of wine or light beer.  Ask for dressings, sauces, and syrups on the side. These are usually high in calories, so you should limit the amount you eat.  If you want a salad, choose a garden salad and ask for grilled meats. Avoid extra toppings like bacon, cheese, or fried items. Ask for the dressing on the side, or ask for olive oil and vinegar or lemon to use as dressing.  Estimate how many servings of a food you are given. For example, a serving of  cooked rice is  cup or about the size of half a baseball. Knowing serving sizes will help you be aware of how much food you are eating at restaurants. The list below tells you how big or small some common portion sizes are based on everyday objects: ? 1 oz--4 stacked dice. ? 3 oz--1 deck of cards. ? 1 tsp--1 die. ? 1 Tbsp-- a ping-pong ball. ? 2 Tbsp--1 ping-pong ball. ?  cup-- baseball. ? 1 cup--1 baseball. Summary  Calorie counting means keeping track of how many calories you eat and drink each day. If you eat fewer calories than your body needs, you should lose weight.  A healthy amount of weight to lose per week is usually 1-2 lb (0.5-0.9 kg). This usually means reducing your daily calorie intake by 500-750 calories.  The number of calories in a food can be found on a Nutrition Facts label. If a food does not have a Nutrition Facts label, try to look up the calories online or ask your dietitian for help.  Use your calories on foods and drinks that will fill you up, and not on foods and drinks that will leave you hungry.  Use smaller plates, glasses, and bowls to prevent overeating. This information is not intended to replace advice given to you by your health care provider. Make sure you discuss any questions you have with your health care provider. Document Revised: 09/19/2017 Document Reviewed: 12/01/2015 Elsevier Patient Education  2020 [employer] DASH Eating Plan DASH stands for "Dietary Approaches to Stop Hypertension." The DASH eating plan is a healthy eating plan that has been shown to reduce high blood pressure (hypertension). It may also reduce your risk for type 2 diabetes, heart disease, and stroke. The DASH eating plan may also help with weight loss. What are tips for following this plan?  General guidelines  Avoid eating more than 2,300 mg (milligrams) of salt (sodium) a day. If you have hypertension, you may need to reduce your sodium intake to 1,500 mg   a  day.  Limit alcohol intake to no more than 1 drink a day for nonpregnant women and 2 drinks a day for men. One drink equals 12 oz of beer, 5 oz of wine, or 1 oz of hard liquor.  Work with your health care provider to maintain a healthy body weight or to lose weight. Ask what an ideal weight is for you.  Get at least 30 minutes of exercise that causes your heart to beat faster (aerobic exercise) most days of the week. Activities may include walking, swimming, or biking.  Work with your health care provider or diet and nutrition specialist (dietitian) to adjust your eating plan to your individual calorie needs. Reading food labels   Check food labels for the amount of sodium per serving. Choose foods with less than 5 percent of the Daily Value of sodium. Generally, foods with less than 300 mg of sodium per serving fit into this eating plan.  To find whole grains, look for the word "whole" as the first word in the ingredient list. Shopping  Buy products labeled as "low-sodium" or "no salt added."  Buy fresh foods. Avoid canned foods and premade or frozen meals. Cooking  Avoid adding salt when cooking. Use salt-free seasonings or herbs instead of table salt or sea salt. Check with your health care provider or pharmacist before using salt substitutes.  Do not fry foods. Cook foods using healthy methods such as baking, boiling, grilling, and broiling instead.  Cook with heart-healthy oils, such as olive, canola, soybean, or sunflower oil. Meal planning  Eat a balanced diet that includes: ? 5 or more servings of fruits and vegetables each day. At each meal, try to fill half of your plate with fruits and vegetables. ? Up to 6-8 servings of whole grains each day. ? Less than 6 oz of lean meat, poultry, or fish each day. A 3-oz serving of meat is about the same size as a deck of cards. One egg equals 1 oz. ? 2 servings of low-fat dairy each day. ? A serving of nuts, seeds, or beans 5 times  each week. ? Heart-healthy fats. Healthy fats called Omega-3 fatty acids are found in foods such as flaxseeds and coldwater fish, like sardines, salmon, and mackerel.  Limit how much you eat of the following: ? Canned or prepackaged foods. ? Food that is high in trans fat, such as fried foods. ? Food that is high in saturated fat, such as fatty meat. ? Sweets, desserts, sugary drinks, and other foods with added sugar. ? Full-fat dairy products.  Do not salt foods before eating.  Try to eat at least 2 vegetarian meals each week.  Eat more home-cooked food and less restaurant, buffet, and fast food.  When eating at a restaurant, ask that your food be prepared with less salt or no salt, if possible. What foods are recommended? The items listed may not be a complete list. Talk with your dietitian about what dietary choices are best for you. Grains Whole-grain or whole-wheat bread. Whole-grain or whole-wheat pasta. Brown rice. Oatmeal. Quinoa. Bulgur. Whole-grain and low-sodium cereals. Pita bread. Low-fat, low-sodium crackers. Whole-wheat flour tortillas. Vegetables Fresh or frozen vegetables (raw, steamed, roasted, or grilled). Low-sodium or reduced-sodium tomato and vegetable juice. Low-sodium or reduced-sodium tomato sauce and tomato paste. Low-sodium or reduced-sodium canned vegetables. Fruits All fresh, dried, or frozen fruit. Canned fruit in natural juice (without added sugar). Meat and other protein foods Skinless chicken or turkey. Ground chicken or turkey.   Pork with fat trimmed off. Fish and seafood. Egg whites. Dried beans, peas, or lentils. Unsalted nuts, nut butters, and seeds. Unsalted canned beans. Lean cuts of beef with fat trimmed off. Low-sodium, lean deli meat. Dairy Low-fat (1%) or fat-free (skim) milk. Fat-free, low-fat, or reduced-fat cheeses. Nonfat, low-sodium ricotta or cottage cheese. Low-fat or nonfat yogurt. Low-fat, low-sodium cheese. Fats and oils Soft margarine  without trans fats. Vegetable oil. Low-fat, reduced-fat, or light mayonnaise and salad dressings (reduced-sodium). Canola, safflower, olive, soybean, and sunflower oils. Avocado. Seasoning and other foods Herbs. Spices. Seasoning mixes without salt. Unsalted popcorn and pretzels. Fat-free sweets. What foods are not recommended? The items listed may not be a complete list. Talk with your dietitian about what dietary choices are best for you. Grains Baked goods made with fat, such as croissants, muffins, or some breads. Dry pasta or rice meal packs. Vegetables Creamed or fried vegetables. Vegetables in a cheese sauce. Regular canned vegetables (not low-sodium or reduced-sodium). Regular canned tomato sauce and paste (not low-sodium or reduced-sodium). Regular tomato and vegetable juice (not low-sodium or reduced-sodium). Angie Fava. Olives. Fruits Canned fruit in a light or heavy syrup. Fried fruit. Fruit in cream or butter sauce. Meat and other protein foods Fatty cuts of meat. Ribs. Fried meat. Berniece Salines. Sausage. Bologna and other processed lunch meats. Salami. Fatback. Hotdogs. Bratwurst. Salted nuts and seeds. Canned beans with added salt. Canned or smoked fish. Whole eggs or egg yolks. Chicken or [place] with skin. Dairy Whole or 2% milk, cream, and half-and-half. Whole or full-fat cream cheese. Whole-fat or sweetened yogurt. Full-fat cheese. Nondairy creamers. Whipped toppings. Processed cheese and cheese spreads. Fats and oils Butter. Stick margarine. Lard. Shortening. Ghee. Bacon fat. Tropical oils, such as coconut, palm kernel, or palm oil. Seasoning and other foods Salted popcorn and pretzels. Onion salt, garlic salt, seasoned salt, table salt, and sea salt. Worcestershire sauce. Tartar sauce. Barbecue sauce. Teriyaki sauce. Soy sauce, including reduced-sodium. Steak sauce. Canned and packaged gravies. Fish sauce. Oyster sauce. Cocktail sauce. Horseradish that you find on the shelf. Ketchup.  Mustard. Meat flavorings and tenderizers. Bouillon cubes. Hot sauce and Tabasco sauce. Premade or packaged marinades. Premade or packaged taco seasonings. Relishes. Regular salad dressings. Where to find more information:  National Heart, Lung, and [place]: https://wilson-eaton.com/  American Heart Association: www.heart.org Summary  The DASH eating plan is a healthy eating plan that has been shown to reduce high blood pressure (hypertension). It may also reduce your risk for type 2 diabetes, heart disease, and stroke.  With the DASH eating plan, you should limit salt (sodium) intake to 2,300 mg a day. If you have hypertension, you may need to reduce your sodium intake to 1,500 mg a day.  When on the DASH eating plan, aim to eat more fresh fruits and vegetables, whole grains, lean proteins, low-fat dairy, and heart-healthy fats.  Work with your health care provider or diet and nutrition specialist (dietitian) to adjust your eating plan to your individual calorie needs. This information is not intended to replace advice given to you by your health care provider. Make sure you discuss any questions you have with your health care provider. Document Revised: 12/13/2016 Document Reviewed: 12/25/2015 Elsevier Patient Education  2020 [place] for Diabetes Mellitus, Adult  Carbohydrate counting is a method of keeping track of how many carbohydrates you eat. Eating carbohydrates naturally increases the amount of sugar (glucose) in the blood. Counting how many carbohydrates you eat helps keep your blood glucose within normal  limits, which helps you manage your diabetes (diabetes mellitus). It is important to know how many carbohydrates you can safely have in each meal. This is different for every person. A diet and nutrition specialist (registered dietitian) can help you make a meal plan and calculate how many carbohydrates you should have at each meal and snack. Carbohydrates  are found in the following foods:  Grains, such as breads and cereals.  Dried beans and soy products.  Starchy vegetables, such as potatoes, peas, and corn.  Fruit and fruit juices.  Milk and yogurt.  Sweets and snack foods, such as cake, cookies, candy, chips, and soft drinks. How do I count carbohydrates? There are two ways to count carbohydrates in food. You can use either of the methods or a combination of both. Reading "Nutrition Facts" on packaged food The "Nutrition Facts" list is included on the labels of almost all packaged foods and beverages in the [place] It includes:  The serving size.  Information about nutrients in each serving, including the grams (g) of carbohydrate per serving. To use the "Nutrition Facts":  Decide how many servings you will have.  Multiply the number of servings by the number of carbohydrates per serving.  The resulting number is the total amount of carbohydrates that you will be having. Learning standard serving sizes of other foods When you eat carbohydrate foods that are not packaged or do not include "Nutrition Facts" on the label, you need to measure the servings in order to count the amount of carbohydrates:  Measure the foods that you will eat with a food scale or measuring cup, if needed.  Decide how many standard-size servings you will eat.  Multiply the number of servings by 15. Most carbohydrate-rich foods have about 15 g of carbohydrates per serving. ? For example, if you eat 8 oz (170 g) of strawberries, you will have eaten 2 servings and 30 g of carbohydrates (2 servings x 15 g = 30 g).  For foods that have more than one food mixed, such as soups and casseroles, you must count the carbohydrates in each food that is included. The following list contains standard serving sizes of common carbohydrate-rich foods. Each of these servings has about 15 g of carbohydrates:   hamburger bun or  English muffin.   oz (15 mL) syrup.    oz (14 g) jelly.  1 slice of bread.  1 six-inch tortilla.  3 oz (85 g) cooked rice or pasta.  4 oz (113 g) cooked dried beans.  4 oz (113 g) starchy vegetable, such as peas, corn, or potatoes.  4 oz (113 g) hot cereal.  4 oz (113 g) mashed potatoes or  of a large baked potato.  4 oz (113 g) canned or frozen fruit.  4 oz (120 mL) fruit juice.  4-6 crackers.  6 chicken nuggets.  6 oz (170 g) unsweetened dry cereal.  6 oz (170 g) plain fat-free yogurt or yogurt sweetened with artificial sweeteners.  8 oz (240 mL) milk.  8 oz (170 g) fresh fruit or one small piece of fruit.  24 oz (680 g) popped popcorn. Example of carbohydrate counting Sample meal  3 oz (85 g) chicken breast.  6 oz (170 g) brown rice.  4 oz (113 g) corn.  8 oz (240 mL) milk.  8 oz (170 g) strawberries with sugar-free whipped topping. Carbohydrate calculation 1. Identify the foods that contain carbohydrates: ? Rice. ? Corn. ? Milk. ? Strawberries. 2. Calculate how many  servings you have of each food: ? 2 servings rice. ? 1 serving corn. ? 1 serving milk. ? 1 serving strawberries. 3. Multiply each number of servings by 15 g: ? 2 servings rice x 15 g = 30 g. ? 1 serving corn x 15 g = 15 g. ? 1 serving milk x 15 g = 15 g. ? 1 serving strawberries x 15 g = 15 g. 4. Add together all of the amounts to find the total grams of carbohydrates eaten: ? 30 g + 15 g + 15 g + 15 g = 75 g of carbohydrates total. Summary  Carbohydrate counting is a method of keeping track of how many carbohydrates you eat.  Eating carbohydrates naturally increases the amount of sugar (glucose) in the blood.  Counting how many carbohydrates you eat helps keep your blood glucose within normal limits, which helps you manage your diabetes.  A diet and nutrition specialist (registered dietitian) can help you make a meal plan and calculate how many carbohydrates you should have at each meal and snack. This information  is not intended to replace advice given to you by your health care provider. Make sure you discuss any questions you have with your health care provider. Document Revised: 07/25/2016 Document Reviewed: 06/14/2015 Elsevier Patient Education  2020 [employer].

## 2019-02-25 NOTE — Progress Notes (Signed)
Patient ID: LAFE CLERK, male   DOB: Jan 08, 1981, 39 y.o.   MRN: [id number]  Chief Complaint  Patient presents with  . Establish Care  . Hypertension  . Diabetes    HPI Danny Bauer is a 39 y.o. male who presents to establish care and evaluation of his chronic conditions. He resides in a group home. He states that he has a history of gout and has not taken 300 mg Allopurinol for more than4 years and he states that his last gout flare was more than 5 years ago. He has a history of Diabetes and takes Trulicity 1.5 mg weekly, glipizide 5 mg daily  and Metformin 1000 mg bid. He states that he checks his blood glucose daily and it was last checked last night and it was 159 mg/dl. It was checked during visit and it was 105 mg/dl. His last HgbA1c done on 08/05/2018 was 6.8%. He states that he's working on his diet and performs daily foot check. He also has a history of hypertension and takes Lisinopril- hydrochlorothiazide 20-25 mg, diltiazem 90 mg and potasium 10 MEQ. He has a history of bone spur to his right knee and takes 500 mg Methocarbamol as needed for intermittent pain. He also takes Omeprazole for acid reflux which is under control. He uses CPAP for sleep apnea. He denies chest pain, palpitation, light headedness, cough, fever and chills. Overall, he states that he's doing well and offers no further complaint.  Past Medical History:  Diagnosis Date  . Diabetes mellitus without complication ([place])   . GERD (gastroesophageal reflux disease)   . Hypertension   . Sleep apnea, obstructive    sees Dr. Melissa Montane     Past Surgical History:  Procedure Laterality Date  . NOSE SURGERY  2009   straighten nasal passage   . TOE AMPUTATION  2001   right great toe    Family History  Problem Relation Age of Onset  . Heart failure Mother   . Heart disease Mother   . Diabetes Mother   . Heart attack Father   . Heart disease Father     Social History Social History   Tobacco Use  .  Smoking status: Never Smoker  . Smokeless tobacco: Never Used  Substance Use Topics  . Alcohol use: No  . Drug use: No    Allergies  Allergen Reactions  . Bee Venom Anaphylaxis  . Codeine Diarrhea  . Hydrocodone-Acetaminophen Diarrhea  . Prednisone Diarrhea    Current Outpatient Medications  Medication Sig Dispense Refill  . diltiazem (CARDIZEM) 90 MG tablet Take 1 tablet (90 mg total) by mouth daily. 90 tablet 1  . Dulaglutide (TRULICITY) 1.5 QQ/5.9DG SOPN Inject 1.5 mg into the skin once a week. 4 pen 11  . Ertugliflozin L-PyroglutamicAc (STEGLATRO) 15 MG TABS Take 15 mg by mouth daily. 90 tablet 3  . glipiZIDE (GLUCOTROL) 5 MG tablet Take 1 tablet (5 mg total) by mouth daily before breakfast. 90 tablet 1  . lisinopril-hydrochlorothiazide (ZESTORETIC) 20-25 MG tablet Take 1 tablet by mouth daily. 90 tablet 1  . metFORMIN (GLUCOPHAGE) 1000 MG tablet Take 1 tablet (1,000 mg total) by mouth 2 (two) times daily with a meal. 180 tablet 1  . methocarbamol (ROBAXIN) 500 MG tablet Take 1 tablet (500 mg total) by mouth every 8 (eight) hours as needed for muscle spasms. 30 tablet 0  . omeprazole (PRILOSEC) 20 MG capsule Take 1 capsule (20 mg total) by mouth every morning. 90 capsule 1  .  potassium chloride (KLOR-CON 10) 10 MEQ tablet Take 1 tablet (10 mEq total) by mouth daily. 90 tablet 1  . allopurinol (ZYLOPRIM) 300 MG tablet Take 1 tablet (300 mg total) by mouth every morning. (Patient not taking: Reported on 02/25/2019) 90 tablet 3  . blood glucose meter kit and supplies KIT Dispense based on patient and insurance preference. Use up to four times daily as directed. (FOR ICD-9 250.00, 250.01). 1 each 0  . glucose blood (ONETOUCH VERIO) test strip 1 each by Other route daily. And lancets 1/day 100 each 12   No current facility-administered medications for this visit.    Review of Systems Review of Systems  Constitutional: Negative.   HENT: Negative.   Eyes: Negative.   Respiratory:  Negative.   Cardiovascular: Negative.   Gastrointestinal: Negative.   Endocrine: Negative.   Genitourinary: Negative.   Musculoskeletal: Positive for arthralgias. Back pain: chronic intermittent pain to right knee.  Skin: Negative.   Neurological: Negative.   Hematological: Negative.   Psychiatric/Behavioral: Negative.     Blood pressure 140/76, pulse 86, height _0  (1.88 m), weight (!) 379 lb (171.9 kg), SpO2 95 %.  Physical Exam Physical Exam Constitutional:      Appearance: Normal appearance. He is obese.  HENT:     Head: Normocephalic and atraumatic.     Nose:     Comments: Deferred per Covid protocol mask wearing    Mouth/Throat:     Comments: Deferred per Covid protocol mask wearing Eyes:     Extraocular Movements: Extraocular movements intact.     Pupils: Pupils are equal, round, and reactive to light.  Cardiovascular:     Rate and Rhythm: Normal rate and regular rhythm.     Pulses: Normal pulses.     Heart sounds: Normal heart sounds.  Pulmonary:     Effort: Pulmonary effort is normal.     Breath sounds: Normal breath sounds.  Abdominal:     General: Bowel sounds are normal. There is distension (central obesity).     Palpations: Abdomen is soft.  Genitourinary:    Comments: Deferred per patient. Musculoskeletal:        General: Normal range of motion.     Cervical back: Normal range of motion and neck supple.  Skin:    General: Skin is warm and dry.  Neurological:     General: No focal deficit present.     Mental Status: He is alert and oriented to person, place, and time.  Psychiatric:        Mood and Affect: Mood normal.        Behavior: Behavior normal.        Thought Content: Thought content normal.        Judgment: Judgment normal.     Data Reviewed Lab and past medical history was reviewed.  Assessment and Plan  1. Encounter to establish care - Routine labs will be checked. - Flu Vaccine QUAD 6+ mos PF IM (Fluarix Quad PF) was  administered - Pneumococcal polysaccharide vaccine 23-valent greater than or equal to 2yo subcutaneous/IM was administered - Lipid panel; Future - Comp Met (CMET); Future - CBC with Differential/Platelet; Future - HgB A1c; Future - TSH; Future - Urinalysis; Future  2. MORBID OBESITY - He was encouraged to lose weight. -Counseled the patient on lifestyle modifications including diet and exercise. -Counseled the importance of exercise, starting an exercise program of walking 30 minutes 3-5 times a week. -Counseled on low fat, low calorie, portion control diet.  3. Type 2 diabetes mellitus without complication, without long-term current use of insulin ([facility]) - His will continue on current medication, until HgbA1c result. He was advised to check blood glucose bid, record and bring log to follow up appointment. His goal fasting blood glucose will be between 80-130 mg/dl. - Dulaglutide (TRULICITY) 1.5 WF/0.9NA SOPN; Inject 1.5 mg into the skin once a week.  Dispense: 4 pen; Refill: 11 - metFORMIN (GLUCOPHAGE) 1000 MG tablet; Take 1 tablet (1,000 mg total) by mouth 2 (two) times daily with a meal.  Dispense: 180 tablet; Refill: 1 - glipiZIDE (GLUCOTROL) 5 MG tablet; Take 1 tablet (5 mg total) by mouth daily before breakfast.  Dispense: 90 tablet; Refill: 1 - blood glucose meter kit and supplies KIT; Dispense based on patient and insurance preference. Use up to four times daily as directed. (FOR ICD-9 250.00, 250.01).  Dispense: 1 each; Refill: 0  4. Essential hypertension - He has stage 2 hypertension, and his goal should be less than 140/90 and normal is less than 120/80. He will continue on current medication, check and record blood pressure weekly and bring log to follow up appointment. He was encouraged to continue on DASH diet and exercise as tolerated. - lisinopril-hydrochlorothiazide (ZESTORETIC) 20-25 MG tablet; Take 1 tablet by mouth daily.  Dispense: 90 tablet; Refill: 1 - diltiazem  (CARDIZEM) 90 MG tablet; Take 1 tablet (90 mg total) by mouth daily.  Dispense: 90 tablet; Refill: 1 - potassium chloride (KLOR-CON 10) 10 MEQ tablet; Take 1 tablet (10 mEq total) by mouth daily.  Dispense: 90 tablet; Refill: 1  5. Gastroesophageal reflux disease, unspecified whether esophagitis present - His acid reflux is under control and he will continue on current medication. -Avoid spicy, fatty and fried food -Avoid sodas and sour juices -Avoid heavy meals -Avoid eating 4 hours before bedtime -Elevate head of bed at night - omeprazole (PRILOSEC) 20 MG capsule; Take 1 capsule (20 mg total) by mouth every morning.  Dispense: 90 capsule; Refill: 1  6. Sleep apnea, unspecified type - He will continue using CPAP and encouraged to lose weight.  7. Chronic pain of right knee - He will continue on Robaxin 500 mg for 1 month and if symptom worsens will refer to Orthopedic Surgeon Dr Vickki Hearing. - methocarbamol (ROBAXIN) 500 MG tablet; Take 1 tablet (500 mg total) by mouth every 8 (eight) hours as needed for muscle spasms.  Dispense: 30 tablet; Refill: 0   Follow up : 03/24/2019 or if symptom worsens or fail to improve.  Tonjua Rossetti E Keesha Pellum 02/25/2019, 2:28 PM

## 2019-03-03 ENCOUNTER — Other Ambulatory Visit: Payer: Medicaid Other

## 2019-03-03 ENCOUNTER — Other Ambulatory Visit: Payer: Self-pay

## 2019-03-03 DIAGNOSIS — Z7689 Persons encountering health services in other specified circumstances: Secondary | ICD-10-CM

## 2019-03-05 LAB — COMPREHENSIVE METABOLIC PANEL
ALT: 20 IU/L (ref 0–44)
AST: 18 IU/L (ref 0–40)
Albumin/Globulin Ratio: 1.6 (ref 1.2–2.2)
Albumin: 4.3 g/dL (ref 4.0–5.0)
Alkaline Phosphatase: 115 IU/L (ref 39–117)
BUN/Creatinine Ratio: 18 (ref 9–20)
BUN: 13 mg/dL (ref 6–20)
Bilirubin Total: 0.8 mg/dL (ref 0.0–1.2)
CO2: 18 mmol/L — ABNORMAL LOW (ref 20–29)
Calcium: 9.2 mg/dL (ref 8.7–10.2)
Chloride: 99 mmol/L (ref 96–106)
Creatinine, Ser: 0.73 mg/dL — ABNORMAL LOW (ref 0.76–1.27)
GFR calc Af Amer: 135 mL/min/{1.73_m2} (ref >59)
GFR calc non Af Amer: 117 mL/min/{1.73_m2} (ref >59)
Globulin, Total: 2.7 g/dL (ref 1.5–4.5)
Glucose: 193 mg/dL — ABNORMAL HIGH (ref 65–99)
Potassium: 4.2 mmol/L (ref 3.5–5.2)
Sodium: 135 mmol/L (ref 134–144)
Total Protein: 7 g/dL (ref 6.0–8.5)

## 2019-03-05 LAB — CBC WITH DIFFERENTIAL/PLATELET
Basophils Absolute: 0.1 10*3/uL (ref 0.0–0.2)
Basos: 1 %
EOS (ABSOLUTE): 0.2 10*3/uL (ref 0.0–0.4)
Eos: 3 %
Hematocrit: 46 % (ref 37.5–51.0)
Hemoglobin: 15.1 g/dL (ref 13.0–17.7)
Immature Grans (Abs): 0.1 10*3/uL (ref 0.0–0.1)
Immature Granulocytes: 1 %
Lymphocytes Absolute: 1.1 10*3/uL (ref 0.7–3.1)
Lymphs: 18 %
MCH: 27.4 pg (ref 26.6–33.0)
MCHC: 32.8 g/dL (ref 31.5–35.7)
MCV: 84 fL (ref 79–97)
Monocytes Absolute: 0.3 10*3/uL (ref 0.1–0.9)
Monocytes: 5 %
Neutrophils Absolute: 4.6 10*3/uL (ref 1.4–7.0)
Neutrophils: 72 %
Platelets: 153 10*3/uL (ref 150–450)
RBC: 5.51 x10E6/uL (ref 4.14–5.80)
RDW: 13.9 % (ref 11.6–15.4)
WBC: 6.3 10*3/uL (ref 3.4–10.8)

## 2019-03-05 LAB — URINALYSIS
Bilirubin, UA: NEGATIVE
Glucose, UA: NEGATIVE
Ketones, UA: NEGATIVE
Leukocytes,UA: NEGATIVE
Nitrite, UA: NEGATIVE
Protein,UA: NEGATIVE
RBC, UA: NEGATIVE
Specific Gravity, UA: 1.012 (ref 1.005–1.030)
Urobilinogen, Ur: 0.2 mg/dL (ref 0.2–1.0)
pH, UA: 6 (ref 5.0–7.5)

## 2019-03-05 LAB — LIPID PANEL
Chol/HDL Ratio: 3.8 ratio (ref 0.0–5.0)
Cholesterol, Total: 135 mg/dL (ref 100–199)
HDL: 36 mg/dL — ABNORMAL LOW (ref >39)
LDL Chol Calc (NIH): 77 mg/dL (ref 0–99)
Triglycerides: 121 mg/dL (ref 0–149)
VLDL Cholesterol Cal: 22 mg/dL (ref 5–40)

## 2019-03-05 LAB — HEMOGLOBIN A1C
Est. average glucose Bld gHb Est-mCnc: 169 mg/dL
Hgb A1c MFr Bld: 7.5 % — ABNORMAL HIGH (ref 4.8–5.6)

## 2019-03-05 LAB — TSH: TSH: 2.11 u[IU]/mL (ref 0.450–4.500)

## 2019-03-24 ENCOUNTER — Ambulatory Visit: Payer: Medicaid Other | Admitting: Gerontology

## 2019-03-24 ENCOUNTER — Other Ambulatory Visit: Payer: Self-pay

## 2019-03-24 ENCOUNTER — Encounter: Payer: Self-pay | Admitting: Gerontology

## 2019-03-24 VITALS — BP 131/78 | HR 94 | Temp 97.3°F | Ht 74.0 in | Wt 381.2 lb

## 2019-03-24 DIAGNOSIS — I10 Essential (primary) hypertension: Secondary | ICD-10-CM

## 2019-03-24 DIAGNOSIS — Z91030 Bee allergy status: Secondary | ICD-10-CM

## 2019-03-24 DIAGNOSIS — E119 Type 2 diabetes mellitus without complications: Secondary | ICD-10-CM

## 2019-03-24 MED ORDER — EPINEPHRINE 0.3 MG/0.3ML IJ SOAJ: 0.3000 mg | INTRAMUSCULAR | 1 refills | Status: DC | PRN

## 2019-03-24 NOTE — Patient Instructions (Signed)
Carbohydrate Counting for Diabetes Mellitus, Adult  Carbohydrate counting is a method of keeping track of how many carbohydrates you eat. Eating carbohydrates naturally increases the amount of sugar (glucose) in the blood. Counting how many carbohydrates you eat helps keep your blood glucose within normal limits, which helps you manage your diabetes (diabetes mellitus). It is important to know how many carbohydrates you can safely have in each meal. This is different for every person. A diet and nutrition specialist (registered dietitian) can help you make a meal plan and calculate how many carbohydrates you should have at each meal and snack. Carbohydrates are found in the following foods:  Grains, such as breads and cereals.  Dried beans and soy products.  Starchy vegetables, such as potatoes, peas, and corn.  Fruit and fruit juices.  Milk and yogurt.  Sweets and snack foods, such as cake, cookies, candy, chips, and soft drinks. How do I count carbohydrates? There are two ways to count carbohydrates in food. You can use either of the methods or a combination of both. Reading "Nutrition Facts" on packaged food The "Nutrition Facts" list is included on the labels of almost all packaged foods and beverages in the [place] It includes:  The serving size.  Information about nutrients in each serving, including the grams (g) of carbohydrate per serving. To use the "Nutrition Facts":  Decide how many servings you will have.  Multiply the number of servings by the number of carbohydrates per serving.  The resulting number is the total amount of carbohydrates that you will be having. Learning standard serving sizes of other foods When you eat carbohydrate foods that are not packaged or do not include "Nutrition Facts" on the label, you need to measure the servings in order to count the amount of carbohydrates:  Measure the foods that you will eat with a food scale or measuring cup, if  needed.  Decide how many standard-size servings you will eat.  Multiply the number of servings by 15. Most carbohydrate-rich foods have about 15 g of carbohydrates per serving. ? For example, if you eat 8 oz (170 g) of strawberries, you will have eaten 2 servings and 30 g of carbohydrates (2 servings x 15 g = 30 g).  For foods that have more than one food mixed, such as soups and casseroles, you must count the carbohydrates in each food that is included. The following list contains standard serving sizes of common carbohydrate-rich foods. Each of these servings has about 15 g of carbohydrates:   hamburger bun or  English muffin.   oz (15 mL) syrup.   oz (14 g) jelly.  1 slice of bread.  1 six-inch tortilla.  3 oz (85 g) cooked rice or pasta.  4 oz (113 g) cooked dried beans.  4 oz (113 g) starchy vegetable, such as peas, corn, or potatoes.  4 oz (113 g) hot cereal.  4 oz (113 g) mashed potatoes or  of a large baked potato.  4 oz (113 g) canned or frozen fruit.  4 oz (120 mL) fruit juice.  4-6 crackers.  6 chicken nuggets.  6 oz (170 g) unsweetened dry cereal.  6 oz (170 g) plain fat-free yogurt or yogurt sweetened with artificial sweeteners.  8 oz (240 mL) milk.  8 oz (170 g) fresh fruit or one small piece of fruit.  24 oz (680 g) popped popcorn. Example of carbohydrate counting Sample meal  3 oz (85 g) chicken breast.  6 oz (170 g)   brown rice.  4 oz (113 g) corn.  8 oz (240 mL) milk.  8 oz (170 g) strawberries with sugar-free whipped topping. Carbohydrate calculation 1. Identify the foods that contain carbohydrates: ? Rice. ? Corn. ? Milk. ? Strawberries. 2. Calculate how many servings you have of each food: ? 2 servings rice. ? 1 serving corn. ? 1 serving milk. ? 1 serving strawberries. 3. Multiply each number of servings by 15 g: ? 2 servings rice x 15 g = 30 g. ? 1 serving corn x 15 g = 15 g. ? 1 serving milk x 15 g = 15 g. ? 1  serving strawberries x 15 g = 15 g. 4. Add together all of the amounts to find the total grams of carbohydrates eaten: ? 30 g + 15 g + 15 g + 15 g = 75 g of carbohydrates total. Summary  Carbohydrate counting is a method of keeping track of how many carbohydrates you eat.  Eating carbohydrates naturally increases the amount of sugar (glucose) in the blood.  Counting how many carbohydrates you eat helps keep your blood glucose within normal limits, which helps you manage your diabetes.  A diet and nutrition specialist (registered dietitian) can help you make a meal plan and calculate how many carbohydrates you should have at each meal and snack. This information is not intended to replace advice given to you by your health care provider. Make sure you discuss any questions you have with your health care provider. Document Revised: 07/25/2016 Document Reviewed: 06/14/2015 Elsevier Patient Education  [place].  Hypertension, Adult Hypertension is another name for high blood pressure. High blood pressure forces your heart to work harder to pump blood. This can cause problems over time. There are two numbers in a blood pressure reading. There is a top number (systolic) over a bottom number (diastolic). It is best to have a blood pressure that is below 120/80. Healthy choices can help lower your blood pressure, or you may need medicine to help lower it. What are the causes? The cause of this condition is not known. Some conditions may be related to high blood pressure. What increases the risk?  Smoking.  Having type 2 diabetes mellitus, high cholesterol, or both.  Not getting enough exercise or physical activity.  Being overweight.  Having too much fat, sugar, calories, or salt (sodium) in your diet.  Drinking too much alcohol.  Having long-term (chronic) kidney disease.  Having a family history of high blood pressure.  Age. Risk increases with age.  Race. You may be at  higher risk if you are African American.  Gender. Men are at higher risk than women before age 86. After age 59, women are at higher risk than men.  Having obstructive sleep apnea.  Stress. What are the signs or symptoms?  High blood pressure may not cause symptoms. Very high blood pressure (hypertensive crisis) may cause: ? Headache. ? Feelings of worry or nervousness (anxiety). ? Shortness of breath. ? Nosebleed. ? A feeling of being sick to your stomach (nausea). ? Throwing up (vomiting). ? Changes in how you see. ? Very bad chest pain. ? Seizures. How is this treated?  This condition is treated by making healthy lifestyle changes, such as: ? Eating healthy foods. ? Exercising more. ? Drinking less alcohol.  Your health care provider may prescribe medicine if lifestyle changes are not enough to get your blood pressure under control, and if: ? Your top number is above 130. ?  Your bottom number is above 80.  Your personal target blood pressure may vary. Follow these instructions at home: Eating and drinking   If told, follow the DASH eating plan. To follow this plan: ? Fill one half of your plate at each meal with fruits and vegetables. ? Fill one fourth of your plate at each meal with whole grains. Whole grains include whole-wheat pasta, brown rice, and whole-grain bread. ? Eat or drink low-fat dairy products, such as skim milk or low-fat yogurt. ? Fill one fourth of your plate at each meal with low-fat (lean) proteins. Low-fat proteins include fish, chicken without skin, eggs, beans, and tofu. ? Avoid fatty meat, cured and processed meat, or chicken with skin. ? Avoid pre-made or processed food.  Eat less than 1,500 mg of salt each day.  Do not drink alcohol if: ? Your doctor tells you not to drink. ? You are pregnant, may be pregnant, or are planning to become pregnant.  If you drink alcohol: ? Limit how much you use to:  0-1 drink a day for women.  0-2  drinks a day for men. ? Be aware of how much alcohol is in your drink. In the [place] one drink equals one 12 oz bottle of beer (355 mL), one 5 oz glass of wine (148 mL), or one 1 oz glass of hard liquor (44 mL). Lifestyle   Work with your doctor to stay at a healthy weight or to lose weight. Ask your doctor what the best weight is for you.  Get at least 30 minutes of exercise most days of the week. This may include walking, swimming, or biking.  Get at least 30 minutes of exercise that strengthens your muscles (resistance exercise) at least 3 days a week. This may include lifting weights or doing Pilates.  Do not use any products that contain nicotine or tobacco, such as cigarettes, e-cigarettes, and chewing tobacco. If you need help quitting, ask your doctor.  Check your blood pressure at home as told by your doctor.  Keep all follow-up visits as told by your doctor. This is important. Medicines  Take over-the-counter and prescription medicines only as told by your doctor. Follow directions carefully.  Do not skip doses of blood pressure medicine. The medicine does not work as well if you skip doses. Skipping doses also puts you at risk for problems.  Ask your doctor about side effects or reactions to medicines that you should watch for. Contact a doctor if you:  Think you are having a reaction to the medicine you are taking.  Have headaches that keep coming back (recurring).  Feel dizzy.  Have swelling in your ankles.  Have trouble with your vision. Get help right away if you:  Get a very bad headache.  Start to feel mixed up (confused).  Feel weak or numb.  Feel faint.  Have very bad pain in your: ? Chest. ? Belly (abdomen).  Throw up more than once.  Have trouble breathing. Summary  Hypertension is another name for high blood pressure.  High blood pressure forces your heart to work harder to pump blood.  For most people, a normal blood pressure is less  than 120/80.  Making healthy choices can help lower blood pressure. If your blood pressure does not get lower with healthy choices, you may need to take medicine. This information is not intended to replace advice given to you by your health care provider. Make sure you discuss any questions you have with  your health care provider. Document Revised: 09/10/2017 Document Reviewed: 09/10/2017 Elsevier Patient Education  2020 [employer].

## 2019-03-24 NOTE — Progress Notes (Signed)
Established Patient Office Visit  Subjective:  Patient ID: Danny Bauer, male    DOB: 1980-03-13  Age: 39 y.o. MRN: [id number]  CC:  Chief Complaint  Patient presents with  . Follow-up    HPI Danny Bauer presents for type 2 diabetes, hypertension and lab review. He states that he's compliant with his medications and continues to make healthy lifestyle changes. His HgbA1c done on 03/03/2019 was 7.5%,  he reports that he checks his blood glucose daily and his fasting reading was 191 mg/dl today. He states that he performs daily foot checks, and denies any hypo/hyperglycemic symptoms. He states that he doesn't check his blood pressure at home, but continues to adhere to DASH diet. He states that he's allergic to Bee sting and requested an Epi pen. He denies chest pain, light headedness, shortness of breath, fever and chills. Overall, he states that he's doing well and offers no further complaint.  Past Medical History:  Diagnosis Date  . Diabetes mellitus without complication ([place])   . GERD (gastroesophageal reflux disease)   . Hypertension   . Sleep apnea, obstructive    sees Dr. Melissa Montane     Past Surgical History:  Procedure Laterality Date  . NOSE SURGERY  2009   straighten nasal passage   . TOE AMPUTATION  2001   right great toe    Family History  Problem Relation Age of Onset  . Heart failure Mother   . Heart disease Mother   . Diabetes Mother   . Heart attack Father   . Heart disease Father     Social History   Socioeconomic History  . Marital status: Legally Separated    Spouse name: Not on file  . Number of children: Not on file  . Years of education: Not on file  . Highest education level: Not on file  Occupational History  . Not on file  Tobacco Use  . Smoking status: Never Smoker  . Smokeless tobacco: Never Used  Substance and Sexual Activity  . Alcohol use: No  . Drug use: No  . Sexual activity: Not on file  Other Topics Concern  . Not on  file  Social History Narrative  . Not on file   Social Determinants of Health   Financial Resource Strain:   . Difficulty of Paying Living Expenses: Not on file  Food Insecurity:   . Worried About Charity fundraiser in the Last Year: Not on file  . Ran Out of Food in the Last Year: Not on file  Transportation Needs:   . Lack of Transportation (Medical): Not on file  . Lack of Transportation (Non-Medical): Not on file  Physical Activity:   . Days of Exercise per Week: Not on file  . Minutes of Exercise per Session: Not on file  Stress:   . Feeling of Stress : Not on file  Social Connections:   . Frequency of Communication with Friends and Family: Not on file  . Frequency of Social Gatherings with Friends and Family: Not on file  . Attends Religious Services: Not on file  . Active Member of Clubs or Organizations: Not on file  . Attends Archivist Meetings: Not on file  . Marital Status: Not on file  Intimate Partner Violence:   . Fear of Current or Ex-Partner: Not on file  . Emotionally Abused: Not on file  . Physically Abused: Not on file  . Sexually Abused: Not on file  Outpatient Medications Prior to Visit  Medication Sig Dispense Refill  . blood glucose meter kit and supplies KIT Dispense based on patient and insurance preference. Use up to four times daily as directed. (FOR ICD-9 250.00, 250.01). 1 each 0  . diltiazem (CARDIZEM) 90 MG tablet Take 1 tablet (90 mg total) by mouth daily. 90 tablet 1  . Dulaglutide (TRULICITY) 1.5 VV/6.1YW SOPN Inject 1.5 mg into the skin once a week. 4 pen 11  . glipiZIDE (GLUCOTROL) 5 MG tablet Take 1 tablet (5 mg total) by mouth daily before breakfast. 90 tablet 1  . glucose blood (ONETOUCH VERIO) test strip 1 each by Other route daily. And lancets 1/day 100 each 12  . lisinopril-hydrochlorothiazide (ZESTORETIC) 20-25 MG tablet Take 1 tablet by mouth daily. 90 tablet 1  . metFORMIN (GLUCOPHAGE) 1000 MG tablet Take 1 tablet  (1,000 mg total) by mouth 2 (two) times daily with a meal. 180 tablet 1  . methocarbamol (ROBAXIN) 500 MG tablet Take 1 tablet (500 mg total) by mouth every 8 (eight) hours as needed for muscle spasms. 30 tablet 0  . omeprazole (PRILOSEC) 20 MG capsule Take 1 capsule (20 mg total) by mouth every morning. 90 capsule 1  . potassium chloride (KLOR-CON 10) 10 MEQ tablet Take 1 tablet (10 mEq total) by mouth daily. 90 tablet 1  . allopurinol (ZYLOPRIM) 300 MG tablet Take 1 tablet (300 mg total) by mouth every morning. (Patient not taking: Reported on 02/25/2019) 90 tablet 3   No facility-administered medications prior to visit.    Allergies  Allergen Reactions  . Bee Venom Anaphylaxis  . Codeine Diarrhea  . Hydrocodone-Acetaminophen Diarrhea  . Prednisone Diarrhea    ROS Review of Systems  Constitutional: Negative.   Respiratory: Negative.   Cardiovascular: Negative.   Endocrine: Negative.   Genitourinary: Negative.   Neurological: Negative.   Psychiatric/Behavioral: Negative.       Objective:    Physical Exam  Constitutional: He is oriented to person, place, and time. He appears well-developed.  HENT:  Head: Normocephalic and atraumatic.  Eyes: Pupils are equal, round, and reactive to light. EOM are normal.  Cardiovascular: Normal rate and regular rhythm.  Pulmonary/Chest: Effort normal and breath sounds normal.  Neurological: He is alert and oriented to person, place, and time.  Psychiatric: He has a normal mood and affect. His behavior is normal. Judgment and thought content normal.    BP 131/78 (BP Location: Left Arm, Patient Position: Sitting, Cuff Size: Large)   Pulse 94   Temp (!) 97.3 F (36.3 C)   Ht _0  (1.88 m)   Wt (!) 381 lb 3.2 oz (172.9 kg)   SpO2 96%   BMI 48.94 kg/m  Wt Readings from Last 3 Encounters:  03/24/19 (!) 381 lb 3.2 oz (172.9 kg)  02/25/19 (!) 379 lb (171.9 kg)  03/05/18 (!) 331 lb (150.1 kg)   He gained 2 pounds in 4 weeks, and he was  encouraged to lose weight.  Health Maintenance Due  Topic Date Due  . OPHTHALMOLOGY EXAM  11/27/1989  . HIV Screening  11/28/1994    There are no preventive care reminders to display for this patient.  Lab Results  Component Value Date   TSH 2.110 03/03/2019   Lab Results  Component Value Date   WBC 6.3 03/03/2019   HGB 15.1 03/03/2019   HCT 46.0 03/03/2019   MCV 84 03/03/2019   PLT 153 03/03/2019   Lab Results  Component Value Date  NA 135 03/03/2019   K 4.2 03/03/2019   CO2 18 (L) 03/03/2019   GLUCOSE 193 (H) 03/03/2019   BUN 13 03/03/2019   CREATININE 0.73 (L) 03/03/2019   BILITOT 0.8 03/03/2019   ALKPHOS 115 03/03/2019   AST 18 03/03/2019   ALT 20 03/03/2019   PROT 7.0 03/03/2019   ALBUMIN 4.3 03/03/2019   CALCIUM 9.2 03/03/2019   GFR 97.85 08/05/2018   Lab Results  Component Value Date   CHOL 135 03/03/2019   Lab Results  Component Value Date   HDL 36 (L) 03/03/2019   Lab Results  Component Value Date   LDLCALC 77 03/03/2019   Lab Results  Component Value Date   TRIG 121 03/03/2019   Lab Results  Component Value Date   CHOLHDL 3.8 03/03/2019   Lab Results  Component Value Date   HGBA1C 7.5 (H) 03/03/2019      Assessment & Plan:   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 He was educated on medication side effects and was advised to notify clinic. - EPINEPHrine 0.3 mg/0.3 mL IJ SOAJ injection; Inject 0.3 mLs (0.3 mg total) into the muscle as needed for anaphylaxis.  Dispense: 1 each; Refill: 1  2. Essential hypertension - His blood pressure is under control, he will continue on current treatment regimen. -Low salt DASH diet -Take medications regularly on time -Exercise regularly as tolerated -Check blood pressure at least once a week at home or a nearby pharmacy and record -Goal is less than 140/90 and normal blood pressure is less than 120/80    3. Type 2 diabetes mellitus without complication, without  long-term current use of insulin ([facility]) - His HgbA1c was 7.5%, his goal should be less than 7%. He was advised to continue checking his fasting blood glucose, his goal should be between 80-130 mg/dl. He was advised to continue on low carb/non concentrated sweet diet and exercise as tolerated. - HgB A1c; Future  4. MORBID OBESITY -Counseled the patient on lifestyle modifications including diet and exercise. -Counseled the importance of exercise, starting an exercise program of walking 30 minutes 3-5 times a week. -Counseled on low fat, low calorie, portion control diet     Follow-up: Return in about 11 weeks (around 06/09/2019), or if symptoms worsen or fail to improve.    Rosie Torrez Jerold Coombe, NP

## 2019-04-08 ENCOUNTER — Telehealth: Payer: Self-pay | Admitting: Pharmacy Technician

## 2019-04-08 NOTE — Telephone Encounter (Signed)
Patient failed to provide 2021 proof of income.  No additional medication assistance will be provided by [facility] without the required proof of income documentation.  Patient notified by letter.  Leanndra Pember J. Alois Mincer Care Manager [facility]   [address] [place], [place]  [address]     This is to inform you that you are no longer eligible to receive medication assistance at [facility].  The reason(s) are:    _____Your total gross monthly household income exceeds 250% of the Federal Poverty Level.   _____Tangible assets (savings, checking, stocks/bonds, pension, retirement, etc.) exceeds our limit  _____You are eligible to receive benefits from Medicaid, [facility] or HIV Medication              Assistance Program _____You are eligible to receive benefits from a Medicare Part "D" plan _____You have prescription insurance  _____You are not an [place] resident __X__Failure to provide all requested proof of income information for 2021.    Medication assistance will resume once all requested financial information has been returned to our clinic.  If you have questions, please contact our clinic at [phone].    Thank you,  [facility] 

## 2019-06-02 ENCOUNTER — Other Ambulatory Visit: Payer: Self-pay

## 2019-06-02 ENCOUNTER — Other Ambulatory Visit: Payer: Medicaid Other

## 2019-06-02 DIAGNOSIS — E119 Type 2 diabetes mellitus without complications: Secondary | ICD-10-CM

## 2019-06-03 LAB — HEMOGLOBIN A1C
Est. average glucose Bld gHb Est-mCnc: 200 mg/dL
Hgb A1c MFr Bld: 8.6 % — ABNORMAL HIGH (ref 4.8–5.6)

## 2019-06-09 ENCOUNTER — Ambulatory Visit: Payer: Medicaid Other | Admitting: Gerontology

## 2019-06-15 ENCOUNTER — Ambulatory Visit: Payer: Medicaid Other | Admitting: Gerontology

## 2019-06-15 ENCOUNTER — Encounter: Payer: Self-pay | Admitting: Gerontology

## 2019-06-15 ENCOUNTER — Other Ambulatory Visit: Payer: Self-pay

## 2019-06-15 VITALS — BP 143/85 | HR 84 | Temp 96.6°F | Ht 74.0 in | Wt 384.0 lb

## 2019-06-15 DIAGNOSIS — E119 Type 2 diabetes mellitus without complications: Secondary | ICD-10-CM

## 2019-06-15 DIAGNOSIS — I10 Essential (primary) hypertension: Secondary | ICD-10-CM

## 2019-06-15 DIAGNOSIS — G8929 Other chronic pain: Secondary | ICD-10-CM

## 2019-06-15 DIAGNOSIS — K219 Gastro-esophageal reflux disease without esophagitis: Secondary | ICD-10-CM

## 2019-06-15 MED ORDER — OMEPRAZOLE 20 MG PO CPDR: 20.0000 mg | DELAYED_RELEASE_CAPSULE | Freq: Every morning | ORAL | 1 refills | Status: AC

## 2019-06-15 MED ORDER — METHOCARBAMOL 500 MG PO TABS: 500.0000 mg | ORAL_TABLET | Freq: Three times a day (TID) | ORAL | 1 refills | Status: DC | PRN

## 2019-06-15 MED ORDER — GLIPIZIDE 5 MG PO TABS: 5.0000 mg | ORAL_TABLET | Freq: Two times a day (BID) | ORAL | 2 refills | Status: DC

## 2019-06-15 MED ORDER — TRULICITY 1.5 MG/0.5ML ~~LOC~~ SOAJ: 1.5000 mg | SUBCUTANEOUS | 11 refills | Status: DC

## 2019-06-15 MED ORDER — POTASSIUM CHLORIDE ER 10 MEQ PO TBCR: 10.0000 meq | EXTENDED_RELEASE_TABLET | Freq: Every day | ORAL | 1 refills | Status: DC

## 2019-06-15 NOTE — Progress Notes (Signed)
Established Patient Office Visit  Subjective:  Patient ID: Danny Bauer, male    DOB: [birth year]  Age: 39 y.o. MRN: [id number]  CC:  Chief Complaint  Patient presents with   Hypertension    HPI KALETH Bauer presents for follow up of hypertension, type 2 diabetes, lab review and medication refill. Her HgbA1c increased from 7.5% to 8.6%. He states that he's compliant with his medications, but has been out of his Trulicity for 1 week. He states that he last checked his blood glucose on Saturday and it was 190 mg/dl when checked during his clinic visit. He denies hypo/hyperglycemic symptoms, peripheral neuropathy and performs daily foot checks. He states that he's compliant with his medications , doesn't check his blood pressure at home and continues to make life style modifications. Overall, he states that he's doing well and offers no further complaint.    Past Medical History:  Diagnosis Date   Diabetes mellitus without complication ([facility])    GERD (gastroesophageal reflux disease)    Hypertension    Sleep apnea, obstructive    sees Dr. Melissa Montane     Past Surgical History:  Procedure Laterality Date   NOSE SURGERY  2009   straighten nasal passage    TOE AMPUTATION  2001   right great toe    Family History  Problem Relation Age of Onset   Heart failure Mother    Heart disease Mother    Diabetes Mother    Heart attack Father    Heart disease Father     Social History   Socioeconomic History   Marital status: Legally Separated    Spouse name: Not on file   Number of children: Not on file   Years of education: Not on file   Highest education level: Not on file  Occupational History   Not on file  Tobacco Use   Smoking status: Never Smoker   Smokeless tobacco: Never Used  Substance and Sexual Activity   Alcohol use: No   Drug use: No   Sexual activity: Not on file  Other Topics Concern   Not on file  Social History Narrative    Not on file   Social Determinants of Health   Financial Resource Strain:    Difficulty of Paying Living Expenses:   Food Insecurity:    Worried About Charity fundraiser in the Last Year:    Arboriculturist in the Last Year:   Transportation Needs:    Film/video editor (Medical):    Lack of Transportation (Non-Medical):   Physical Activity:    Days of Exercise per Week:    Minutes of Exercise per Session:   Stress:    Feeling of Stress :   Social Connections:    Frequency of Communication with Friends and Family:    Frequency of Social Gatherings with Friends and Family:    Attends Religious Services:    Active Member of Clubs or Organizations:    Attends Music therapist:    Marital Status:   Intimate Partner Violence:    Fear of Current or Ex-Partner:    Emotionally Abused:    Physically Abused:    Sexually Abused:     Outpatient Medications Prior to Visit  Medication Sig Dispense Refill   allopurinol (ZYLOPRIM) 300 MG tablet Take 1 tablet (300 mg total) by mouth every morning. 90 tablet 3   diltiazem (CARDIZEM) 90 MG tablet Take 1 tablet (90 mg total) by  mouth daily. 90 tablet 1   EPINEPHrine 0.3 mg/0.3 mL IJ SOAJ injection Inject 0.3 mLs (0.3 mg total) into the muscle as needed for anaphylaxis. 1 each 1   lisinopril-hydrochlorothiazide (ZESTORETIC) 20-25 MG tablet Take 1 tablet by mouth daily. 90 tablet 1   metFORMIN (GLUCOPHAGE) 1000 MG tablet Take 1 tablet (1,000 mg total) by mouth 2 (two) times daily with a meal. 180 tablet 1   Dulaglutide (TRULICITY) 1.5 IW/8.0HO SOPN Inject 1.5 mg into the skin once a week. 4 pen 11   glipiZIDE (GLUCOTROL) 5 MG tablet Take 1 tablet (5 mg total) by mouth daily before breakfast. 90 tablet 1   methocarbamol (ROBAXIN) 500 MG tablet Take 1 tablet (500 mg total) by mouth every 8 (eight) hours as needed for muscle spasms. 30 tablet 0   omeprazole (PRILOSEC) 20 MG capsule Take 1 capsule (20 mg  total) by mouth every morning. 90 capsule 1   potassium chloride (KLOR-CON 10) 10 MEQ tablet Take 1 tablet (10 mEq total) by mouth daily. 90 tablet 1   blood glucose meter kit and supplies KIT Dispense based on patient and insurance preference. Use up to four times daily as directed. (FOR ICD-9 250.00, 250.01). 1 each 0   glucose blood (ONETOUCH VERIO) test strip 1 each by Other route daily. And lancets 1/day 100 each 12   No facility-administered medications prior to visit.    Allergies  Allergen Reactions   Bee Venom Anaphylaxis   Codeine Diarrhea   Hydrocodone-Acetaminophen Diarrhea   Prednisone Diarrhea    ROS Review of Systems  Constitutional: Negative.   Eyes: Negative.   Respiratory: Negative.   Cardiovascular: Negative.   Endocrine: Negative.   Neurological: Negative.   Psychiatric/Behavioral: Negative.       Objective:    Physical Exam  Constitutional: He is oriented to person, place, and time. He appears well-developed.  HENT:  Head: Normocephalic and atraumatic.  Eyes: Pupils are equal, round, and reactive to light. EOM are normal.  Cardiovascular: Normal rate and regular rhythm.  Pulmonary/Chest: Effort normal and breath sounds normal.  Neurological: He is alert and oriented to person, place, and time.  Psychiatric: He has a normal mood and affect. His behavior is normal. Judgment and thought content normal.    BP (!) 143/85 (BP Location: Left Arm, Patient Position: Sitting, Cuff Size: Large)    Pulse 84    Temp (!) 96.6 F (35.9 C)    Ht _0  (1.88 m)    Wt (!) 384 lb (174.2 kg)    SpO2 97%    BMI 49.30 kg/m  Wt Readings from Last 3 Encounters:  06/15/19 (!) 384 lb (174.2 kg)  06/02/19 (!) 387 lb 9.6 oz (175.8 kg)  03/24/19 (!) 381 lb 3.2 oz (172.9 kg)  He was strongly encouraged to continue on his weight loss regimen.   Health Maintenance Due  Topic Date Due   OPHTHALMOLOGY EXAM  Never done   HIV Screening  Never done    There are no  preventive care reminders to display for this patient.  Lab Results  Component Value Date   TSH 2.110 03/03/2019   Lab Results  Component Value Date   WBC 6.3 03/03/2019   HGB 15.1 03/03/2019   HCT 46.0 03/03/2019   MCV 84 03/03/2019   PLT 153 03/03/2019   Lab Results  Component Value Date   NA 135 03/03/2019   K 4.2 03/03/2019   CO2 18 (L) 03/03/2019   GLUCOSE 193 (  H) 03/03/2019   BUN 13 03/03/2019   CREATININE 0.73 (L) 03/03/2019   BILITOT 0.8 03/03/2019   ALKPHOS 115 03/03/2019   AST 18 03/03/2019   ALT 20 03/03/2019   PROT 7.0 03/03/2019   ALBUMIN 4.3 03/03/2019   CALCIUM 9.2 03/03/2019   GFR 97.85 08/05/2018   Lab Results  Component Value Date   CHOL 135 03/03/2019   Lab Results  Component Value Date   HDL 36 (L) 03/03/2019   Lab Results  Component Value Date   LDLCALC 77 03/03/2019   Lab Results  Component Value Date   TRIG 121 03/03/2019   Lab Results  Component Value Date   CHOLHDL 3.8 03/03/2019   Lab Results  Component Value Date   HGBA1C 8.6 (H) 06/02/2019      Assessment & Plan:   1. Gastroesophageal reflux disease, unspecified whether esophagitis present - He states that his reflux is under control and he will continue on current medication regimen. -Avoid spicy, fatty and fried food -Avoid sodas and sour juices -Avoid heavy meals -Avoid eating 4 hours before bedtime -Elevate head of bed at night - omeprazole (PRILOSEC) 20 MG capsule; Take 1 capsule (20 mg total) by mouth every morning.  Dispense: 90 capsule; Refill: 1  2. Chronic pain of right knee - He will continue on current treatment regimen. - methocarbamol (ROBAXIN) 500 MG tablet; Take 1 tablet (500 mg total) by mouth every 8 (eight) hours as needed for muscle spasms.  Dispense: 30 tablet; Refill: 1  3. Type 2 diabetes mellitus without complication, without long-term current use of insulin (HCC) - His HgbA1c was 8.6% and his goal should be less than 7%. Her Glipizide was  increased to 5 mg bid. He was encouraged to check his blood glucose bid, record and bring log to follow up appointment. He was advised that his fasting blood glucose should be between 80-130 mg/dl. He was advised to continue on low carb/non concentrated sweet diet and exercise as tolerated. - Dulaglutide (TRULICITY) 1.5 GA/0.2BK SOPN; Inject 1.5 mg into the skin once a week.  Dispense: 4 pen; Refill: 11 - glipiZIDE (GLUCOTROL) 5 MG tablet; Take 1 tablet (5 mg total) by mouth 2 (two) times daily before a meal.  Dispense: 60 tablet; Refill: 2 - HgB A1c; Future  4. Essential hypertension - His blood pressure is not controlled, he will continue on current treatment regimen, advised to continue on DASH diet, exercise as tolerated and lose weight. - potassium chloride (KLOR-CON 10) 10 MEQ tablet; Take 1 tablet (10 mEq total) by mouth daily.  Dispense: 90 tablet; Refill: 1     Follow-up: Return in about 12 weeks (around 09/07/2019), or if symptoms worsen or fail to improve.    Jamaria Amborn Jerold Coombe, NP

## 2019-06-15 NOTE — Patient Instructions (Signed)
Carbohydrate Counting for Diabetes Mellitus, Adult  Carbohydrate counting is a method of keeping track of how many carbohydrates you eat. Eating carbohydrates naturally increases the amount of sugar (glucose) in the blood. Counting how many carbohydrates you eat helps keep your blood glucose within normal limits, which helps you manage your diabetes (diabetes mellitus). It is important to know how many carbohydrates you can safely have in each meal. This is different for every person. A diet and nutrition specialist (registered dietitian) can help you make a meal plan and calculate how many carbohydrates you should have at each meal and snack. Carbohydrates are found in the following foods:  Grains, such as breads and cereals.  Dried beans and soy products.  Starchy vegetables, such as potatoes, peas, and corn.  Fruit and fruit juices.  Milk and yogurt.  Sweets and snack foods, such as cake, cookies, candy, chips, and soft drinks. How do I count carbohydrates? There are two ways to count carbohydrates in food. You can use either of the methods or a combination of both. Reading "Nutrition Facts" on packaged food The "Nutrition Facts" list is included on the labels of almost all packaged foods and beverages in the [place] It includes:  The serving size.  Information about nutrients in each serving, including the grams (g) of carbohydrate per serving. To use the "Nutrition Facts":  Decide how many servings you will have.  Multiply the number of servings by the number of carbohydrates per serving.  The resulting number is the total amount of carbohydrates that you will be having. Learning standard serving sizes of other foods When you eat carbohydrate foods that are not packaged or do not include "Nutrition Facts" on the label, you need to measure the servings in order to count the amount of carbohydrates:  Measure the foods that you will eat with a food scale or measuring cup, if  needed.  Decide how many standard-size servings you will eat.  Multiply the number of servings by 15. Most carbohydrate-rich foods have about 15 g of carbohydrates per serving. ? For example, if you eat 8 oz (170 g) of strawberries, you will have eaten 2 servings and 30 g of carbohydrates (2 servings x 15 g = 30 g).  For foods that have more than one food mixed, such as soups and casseroles, you must count the carbohydrates in each food that is included. The following list contains standard serving sizes of common carbohydrate-rich foods. Each of these servings has about 15 g of carbohydrates:   hamburger bun or  English muffin.   oz (15 mL) syrup.   oz (14 g) jelly.  1 slice of bread.  1 six-inch tortilla.  3 oz (85 g) cooked rice or pasta.  4 oz (113 g) cooked dried beans.  4 oz (113 g) starchy vegetable, such as peas, corn, or potatoes.  4 oz (113 g) hot cereal.  4 oz (113 g) mashed potatoes or  of a large baked potato.  4 oz (113 g) canned or frozen fruit.  4 oz (120 mL) fruit juice.  4-6 crackers.  6 chicken nuggets.  6 oz (170 g) unsweetened dry cereal.  6 oz (170 g) plain fat-free yogurt or yogurt sweetened with artificial sweeteners.  8 oz (240 mL) milk.  8 oz (170 g) fresh fruit or one small piece of fruit.  24 oz (680 g) popped popcorn. Example of carbohydrate counting Sample meal  3 oz (85 g) chicken breast.  6 oz (170 g)   brown rice.  4 oz (113 g) corn.  8 oz (240 mL) milk.  8 oz (170 g) strawberries with sugar-free whipped topping. Carbohydrate calculation 1. Identify the foods that contain carbohydrates: ? Rice. ? Corn. ? Milk. ? Strawberries. 2. Calculate how many servings you have of each food: ? 2 servings rice. ? 1 serving corn. ? 1 serving milk. ? 1 serving strawberries. 3. Multiply each number of servings by 15 g: ? 2 servings rice x 15 g = 30 g. ? 1 serving corn x 15 g = 15 g. ? 1 serving milk x 15 g = 15 g. ? 1  serving strawberries x 15 g = 15 g. 4. Add together all of the amounts to find the total grams of carbohydrates eaten: ? 30 g + 15 g + 15 g + 15 g = 75 g of carbohydrates total. Summary  Carbohydrate counting is a method of keeping track of how many carbohydrates you eat.  Eating carbohydrates naturally increases the amount of sugar (glucose) in the blood.  Counting how many carbohydrates you eat helps keep your blood glucose within normal limits, which helps you manage your diabetes.  A diet and nutrition specialist (registered dietitian) can help you make a meal plan and calculate how many carbohydrates you should have at each meal and snack. This information is not intended to replace advice given to you by your health care provider. Make sure you discuss any questions you have with your health care provider. Document Revised: 07/25/2016 Document Reviewed: 06/14/2015 Elsevier Patient Education  2020 [employer]  

## 2019-07-28 ENCOUNTER — Telehealth: Payer: Self-pay | Admitting: Pharmacy Technician

## 2019-07-28 NOTE — Telephone Encounter (Signed)
Patient failed to provide 2021 proof of income.  No additional medication assistance will be provided by [facility] without the required proof of income documentation.  Patient notified by letter.     Marcille Blanco [place], [place]  [address]  July 28, 2019    Mr. Dandra Velardi [address] [place], [place]  [address]  Dear Mr. Cleon Gustin:  This is to inform you that you are no longer eligible to receive medication assistance at [facility].  The reason(s) are:    _____Your total gross monthly household income exceeds 250% of the Federal Poverty Level.   _____Tangible assets (savings, checking, stocks/bonds, pension, retirement, etc.) exceeds our limit  _____You are eligible to receive benefits from [facility], [facility] or HIV Medication              Assistance Program _____You are eligible to receive benefits from a Medicare Part D plan _____You have prescription insurance  _____You are not an [facility] resident __X__Failure to provide all requested proof of income information for 2021.    Medication assistance will resume once all requested financial information has been returned to our clinic.  If you have questions, please contact our clinic at [phone].    Thank you,  [facility]  Sherilyn Dacosta Care Manager [facility]

## 2019-07-28 NOTE — Telephone Encounter (Signed)
Spoke with patient inquiring about last 30 days of paystubs, 2020 tax return and bank statement.  Patient stated that he is working on obtaining that information.  Explained that [facility] could not provide additional medication assistance without requested documentation. Patient verbally acknowledged that he understood.  Sherilyn Dacosta Care Manager [facility]

## 2019-08-31 ENCOUNTER — Ambulatory Visit: Payer: Medicaid Other

## 2019-09-02 ENCOUNTER — Other Ambulatory Visit: Payer: Medicaid Other

## 2019-09-02 ENCOUNTER — Other Ambulatory Visit: Payer: Self-pay

## 2019-09-02 DIAGNOSIS — E119 Type 2 diabetes mellitus without complications: Secondary | ICD-10-CM

## 2019-09-03 LAB — HEMOGLOBIN A1C
Est. average glucose Bld gHb Est-mCnc: 237 mg/dL
Hgb A1c MFr Bld: 9.9 % — ABNORMAL HIGH (ref 4.8–5.6)

## 2019-09-07 ENCOUNTER — Other Ambulatory Visit: Payer: Self-pay

## 2019-09-07 ENCOUNTER — Encounter: Payer: Self-pay | Admitting: Gerontology

## 2019-09-07 ENCOUNTER — Ambulatory Visit: Payer: Medicaid Other | Admitting: Gerontology

## 2019-09-07 VITALS — Wt 388.0 lb

## 2019-09-07 DIAGNOSIS — E118 Type 2 diabetes mellitus with unspecified complications: Secondary | ICD-10-CM

## 2019-09-07 DIAGNOSIS — E119 Type 2 diabetes mellitus without complications: Secondary | ICD-10-CM

## 2019-09-07 NOTE — Progress Notes (Signed)
[facility]   Progress Note: General Provider: Wolfgang Phoenix, NP  SUBJECTIVE:   Danny Bauer is a 39 y.o. male who  has a past medical history of Diabetes mellitus without complication ([place]), GERD (gastroesophageal reflux disease), Hypertension, and Sleep apnea, obstructive.. Patient presents today for type II diabetes follow- up. HgA1c done on 09/02/2019 increased from 8.6% to 9.9 % .He states that he finally gave up drinking pepsi 2 L/ day 3 weeks ago and he is now trying to eat better, avoiding fast foods and trying to loss weight. He reports that  he skips taking am glipiZide and Metformin about twice a week when he goes to work at 6 am. He states that he checks blood glucose 2-3 times a week, but didn't bring glucose log. He reports his blood glucose ranges between 110 mg/dl  to 150 mg/dl.  Blood glucose check at the clinic was 324 mg/dl and reports he has taken his morning glipizide and metformin . He denies having hypoglycemia or hyperglycemia symptoms. He states that he is going to see ophthalmology in [place]. He offers no further complaints.   Review of Systems  Constitutional: Negative.   Eyes: Negative.   Respiratory: Negative.   Cardiovascular: Negative.   Gastrointestinal: Negative.   Genitourinary: Negative.   Skin: Negative.   Endo/Heme/Allergies: Negative.      OBJECTIVE: Wt (!) 388 lb (176 kg)    BMI 49.82 kg/m   Wt Readings from Last 3 Encounters:  09/07/19 (!) 388 lb (176 kg)  06/15/19 (!) 384 lb (174.2 kg)  06/02/19 (!) 387 lb 9.6 oz (175.8 kg)    He gained 4 pounds in 10 weeks, was advised to lose weight.  Physical Exam Constitutional:      Appearance: Normal appearance.  Cardiovascular:     Rate and Rhythm: Normal rate and regular rhythm.     Pulses: Normal pulses.     Heart sounds: Normal heart sounds.  Pulmonary:     Effort: Pulmonary effort is normal.     Breath sounds: Normal breath sounds.  Neurological:     General: No focal  deficit present.     Mental Status: He is alert and oriented to person, place, and time.  Psychiatric:        Mood and Affect: Mood normal.        Behavior: Behavior normal.     ASSESSMENT/PLAN:  1. Type 2 diabetes mellitus without complication, without long-term current use of insulin (HCC) His HgbA1c was 9.9% and his goal should be less than 7%.  He will resume current treatment regime, advise to check blood glucose fasting and 2 hours after meal and to bring glucose log to the next appointment. He is advised to continue on low carb/ low concentrated sweets diet and regular exercise as tolerated.  He was advise to do daily foot checks. Defers medication adjustment or meal time insulin at this time. Discussed complications of uncontrolled diabetes such as cardiovascular disease, nephropathy, neuropathy, and retinopathy. To contact the clinic if decide to have medication adjustment. glipiZide 5 mg twice a day Metformin 1000 mg twice a day Trulicity 0.6TK subcutaneous weekly  - HgB A1c; Future - Comp Met (CMET) - Urine Microalbumin w/creat. ratio  Return in about 8 weeks (around 11/02/2019), or if symptoms worsen or fail to improve.    The patient was given clear instructions to go to ER or return to medical center if symptoms do not improve, worsen or new problems  develop. The patient verbalized understanding and agreed with plan of care.  Krystyne Tewksbury, [place]

## 2019-09-07 NOTE — Patient Instructions (Signed)
Exercising to Lose Weight Exercise is structured, repetitive physical activity to improve fitness and health. Getting regular exercise is important for everyone. It is especially important if you are overweight. Being overweight increases your risk of heart disease, stroke, diabetes, high blood pressure, and several types of cancer. Reducing your calorie intake and exercising can help you lose weight. Exercise is usually categorized as moderate or vigorous intensity. To lose weight, most people need to do a certain amount of moderate-intensity or vigorous-intensity exercise each week. Moderate-intensity exercise  Moderate-intensity exercise is any activity that gets you moving enough to burn at least three times more energy (calories) than if you were sitting. Examples of moderate exercise include:  Walking a mile in 15 minutes.  Doing light yard work.  Biking at an easy pace. Most people should get at least 150 minutes (2 hours and 30 minutes) a week of moderate-intensity exercise to maintain their body weight. Vigorous-intensity exercise Vigorous-intensity exercise is any activity that gets you moving enough to burn at least six times more calories than if you were sitting. When you exercise at this intensity, you should be working hard enough that you are not able to carry on a conversation. Examples of vigorous exercise include:  Running.  Playing a team sport, such as football, basketball, and soccer.  Jumping rope. Most people should get at least 75 minutes (1 hour and 15 minutes) a week of vigorous-intensity exercise to maintain their body weight. How can exercise affect me? When you exercise enough to burn more calories than you eat, you lose weight. Exercise also reduces body fat and builds muscle. The more muscle you have, the more calories you burn. Exercise also:  Improves mood.  Reduces stress and tension.  Improves your overall fitness, flexibility, and  endurance.  Increases bone strength. The amount of exercise you need to lose weight depends on:  Your age.  The type of exercise.  Any health conditions you have.  Your overall physical ability. Talk to your health care provider about how much exercise you need and what types of activities are safe for you. What actions can I take to lose weight? Nutrition   Make changes to your diet as told by your health care provider or diet and nutrition specialist (dietitian). This may include: ? Eating fewer calories. ? Eating more protein. ? Eating less unhealthy fats. ? Eating a diet that includes fresh fruits and vegetables, whole grains, low-fat dairy products, and lean protein. ? Avoiding foods with added fat, salt, and sugar.  Drink plenty of water while you exercise to prevent dehydration or heat stroke. Activity  Choose an activity that you enjoy and set realistic goals. Your health care provider can help you make an exercise plan that works for you.  Exercise at a moderate or vigorous intensity most days of the week. ? The intensity of exercise may vary from person to person. You can tell how intense a workout is for you by paying attention to your breathing and heartbeat. Most people will notice their breathing and heartbeat get faster with more intense exercise.  Do resistance training twice each week, such as: ? Push-ups. ? Sit-ups. ? Lifting weights. ? Using resistance bands.  Getting short amounts of exercise can be just as helpful as long structured periods of exercise. If you have trouble finding time to exercise, try to include exercise in your daily routine. ? Get up, stretch, and walk around every 30 minutes throughout the day. ? Go for a   walk during your lunch break. ? Park your car farther away from your destination. ? If you take public transportation, get off one stop early and walk the rest of the way. ? Make phone calls while standing up and walking  around. ? Take the stairs instead of elevators or escalators.  Wear comfortable clothes and shoes with good support.  Do not exercise so much that you hurt yourself, feel dizzy, or get very short of breath. Where to find more information  U.S. Department of Health and Human Services: ThisPath.fi  Centers for Disease Control and Prevention (CDC): FootballExhibition.com.br Contact a health care provider:  Before starting a new exercise program.  If you have questions or concerns about your weight.  If you have a medical problem that keeps you from exercising. Get help right away if you have any of the following while exercising:  Injury.  Dizziness.  Difficulty breathing or shortness of breath that does not go away when you stop exercising.  Chest pain.  Rapid heartbeat. Summary  Being overweight increases your risk of heart disease, stroke, diabetes, high blood pressure, and several types of cancer.  Losing weight happens when you burn more calories than you eat.  Reducing the amount of calories you eat in addition to getting regular moderate or vigorous exercise each week helps you lose weight. This information is not intended to replace advice given to you by your health care provider. Make sure you discuss any questions you have with your health care provider. Document Revised: 01/13/2017 Document Reviewed: 01/13/2017 Elsevier Patient Education  2020 Elsevier Inc. Diabetes Basics  Diabetes (diabetes mellitus) is a long-term (chronic) disease. It occurs when the body does not properly use sugar (glucose) that is released from food after you eat. Diabetes may be caused by one or both of these problems:  Your pancreas does not make enough of a hormone called insulin.  Your body does not react in a normal way to insulin that it makes. Insulin lets sugars (glucose) go into cells in your body. This gives you energy. If you have diabetes, sugars cannot get into cells. This causes high  blood sugar (hyperglycemia). Follow these instructions at home: How is diabetes treated? You may need to take insulin or other diabetes medicines daily to keep your blood sugar in balance. Take your diabetes medicines every day as told by your doctor. List your diabetes medicines here: Diabetes medicines  Name of medicine: ______________________________ ? Amount (dose): _______________ Time (a.m./p.m.): _______________ Notes: ___________________________________  Name of medicine: ______________________________ ? Amount (dose): _______________ Time (a.m./p.m.): _______________ Notes: ___________________________________  Name of medicine: ______________________________ ? Amount (dose): _______________ Time (a.m./p.m.): _______________ Notes: ___________________________________ If you use insulin, you will learn how to give yourself insulin by injection. You may need to adjust the amount based on the food that you eat. List the types of insulin you use here: Insulin  Insulin type: ______________________________ ? Amount (dose): _______________ Time (a.m./p.m.): _______________ Notes: ___________________________________  Insulin type: ______________________________ ? Amount (dose): _______________ Time (a.m./p.m.): _______________ Notes: ___________________________________  Insulin type: ______________________________ ? Amount (dose): _______________ Time (a.m./p.m.): _______________ Notes: ___________________________________  Insulin type: ______________________________ ? Amount (dose): _______________ Time (a.m./p.m.): _______________ Notes: ___________________________________  Insulin type: ______________________________ ? Amount (dose): _______________ Time (a.m./p.m.): _______________ Notes: ___________________________________ How do I manage my blood sugar?  Check your blood sugar levels using a blood glucose monitor as directed by your doctor. Your doctor will set treatment goals  for you. Generally, you should have these blood sugar levels:  Before meals (preprandial): 80-130 mg/dL (1.5-0.5 mmol/L).  After meals (postprandial): below 180 mg/dL (10 mmol/L).  A1c level: less than 7%. Write down the times that you will check your blood sugar levels: Blood sugar checks  Time: _______________ Notes: ___________________________________  Time: _______________ Notes: ___________________________________  Time: _______________ Notes: ___________________________________  Time: _______________ Notes: ___________________________________  Time: _______________ Notes: ___________________________________  Time: _______________ Notes: ___________________________________  What do I need to know about low blood sugar? Low blood sugar is called hypoglycemia. This is when blood sugar is at or below 70 mg/dL (3.9 mmol/L). Symptoms may include:  Feeling: ? Hungry. ? Worried or nervous (anxious). ? Sweaty and clammy. ? Confused. ? Dizzy. ? Sleepy. ? Sick to your stomach (nauseous).  Having: ? A fast heartbeat. ? A headache. ? A change in your vision. ? Tingling or no feeling (numbness) around the mouth, lips, or tongue. ? Jerky movements that you cannot control (seizure).  Having trouble with: ? Moving (coordination). ? Sleeping. ? Passing out (fainting). ? Getting upset easily (irritability). Treating low blood sugar To treat low blood sugar, eat or drink something sugary right away. If you can think clearly and swallow safely, follow the 15:15 rule:  Take 15 grams of a fast-acting carb (carbohydrate). Talk with your doctor about how much you should take.  Some fast-acting carbs are: ? Sugar tablets (glucose pills). Take 3-4 glucose pills. ? 6-8 pieces of hard candy. ? 4-6 oz (120-150 mL) of fruit juice. ? 4-6 oz (120-150 mL) of regular (not diet) soda. ? 1 Tbsp (15 mL) honey or sugar.  Check your blood sugar 15 minutes after you take the carb.  If your  blood sugar is still at or below 70 mg/dL (3.9 mmol/L), take 15 grams of a carb again.  If your blood sugar does not go above 70 mg/dL (3.9 mmol/L) after 3 tries, get help right away.  After your blood sugar goes back to normal, eat a meal or a snack within 1 hour. Treating very low blood sugar If your blood sugar is at or below 54 mg/dL (3 mmol/L), you have very low blood sugar (severe hypoglycemia). This is an emergency. Do not wait to see if the symptoms will go away. Get medical help right away. Call your local emergency services (911 in the [place] Do not drive yourself to the hospital. Questions to ask your health care provider  Do I need to meet with a diabetes educator?  What equipment will I need to care for myself at home?  What diabetes medicines do I need? When should I take them?  How often do I need to check my blood sugar?  What number can I call if I have questions?  When is my next doctor's visit?  Where can I find a support group for people with diabetes? Where to find more information  American Diabetes Association: www.diabetes.org  American Association of Diabetes Educators: www.diabeteseducator.org/patient-resources Contact a doctor if:  Your blood sugar is at or above 240 mg/dL (69.7 mmol/L) for 2 days in a row.  You have been sick or have had a fever for 2 days or more, and you are not getting better.  You have any of these problems for more than 6 hours: ? You cannot eat or drink. ? You feel sick to your stomach (nauseous). ? You throw up (vomit). ? You have watery poop (diarrhea). Get help right away if:  Your blood sugar is lower than 54 mg/dL (3 mmol/L).  You  get confused.  You have trouble: ? Thinking clearly. ? Breathing. Summary  Diabetes (diabetes mellitus) is a long-term (chronic) disease. It occurs when the body does not properly use sugar (glucose) that is released from food after digestion.  Take insulin and diabetes medicines as  told.  Check your blood sugar every day, as often as told.  Keep all follow-up visits as told by your doctor. This is important. This information is not intended to replace advice given to you by your health care provider. Make sure you discuss any questions you have with your health care provider. Document Revised: 09/23/2018 Document Reviewed: 04/04/2017 Elsevier Patient Education  2020 [employer].

## 2019-11-02 ENCOUNTER — Ambulatory Visit: Payer: Medicaid Other | Admitting: Gerontology

## 2020-04-09 ENCOUNTER — Emergency Department: Payer: BC Managed Care – PPO

## 2020-04-09 ENCOUNTER — Encounter: Payer: Self-pay | Admitting: Emergency Medicine

## 2020-04-09 ENCOUNTER — Emergency Department
Admission: EM | Admit: 2020-04-09 | Discharge: 2020-04-09 | Disposition: A | Discharge status: Home / Self Care | Payer: BC Managed Care – PPO | Attending: Emergency Medicine | Admitting: Emergency Medicine

## 2020-04-09 ENCOUNTER — Other Ambulatory Visit: Payer: Self-pay

## 2020-04-09 DIAGNOSIS — Z7984 Long term (current) use of oral hypoglycemic drugs: Secondary | ICD-10-CM | POA: Diagnosis not present

## 2020-04-09 DIAGNOSIS — M1612 Unilateral primary osteoarthritis, left hip: Secondary | ICD-10-CM | POA: Diagnosis not present

## 2020-04-09 DIAGNOSIS — Z79899 Other long term (current) drug therapy: Secondary | ICD-10-CM | POA: Diagnosis not present

## 2020-04-09 DIAGNOSIS — I10 Essential (primary) hypertension: Secondary | ICD-10-CM | POA: Diagnosis not present

## 2020-04-09 DIAGNOSIS — E119 Type 2 diabetes mellitus without complications: Secondary | ICD-10-CM | POA: Diagnosis not present

## 2020-04-09 DIAGNOSIS — Z89411 Acquired absence of right great toe: Secondary | ICD-10-CM | POA: Diagnosis not present

## 2020-04-09 DIAGNOSIS — M79605 Pain in left leg: Secondary | ICD-10-CM

## 2020-04-09 DIAGNOSIS — Z794 Long term (current) use of insulin: Secondary | ICD-10-CM | POA: Insufficient documentation

## 2020-04-09 DIAGNOSIS — J45909 Unspecified asthma, uncomplicated: Secondary | ICD-10-CM | POA: Diagnosis not present

## 2020-04-09 DIAGNOSIS — M79652 Pain in left thigh: Secondary | ICD-10-CM | POA: Diagnosis present

## 2020-04-09 MED ORDER — TRAMADOL HCL 50 MG PO TABS: 50.0000 mg | ORAL_TABLET | Freq: Four times a day (QID) | ORAL | 0 refills | Status: DC | PRN

## 2020-04-09 NOTE — ED Triage Notes (Signed)
Pt reports pain to his left thigh for the last couple of weeks. Pt denies injuries, redness or swelling.

## 2020-04-09 NOTE — Discharge Instructions (Addendum)
Please use a cane as needed for ambulation.  Continue with meloxicam, Flexeril.  You may use tramadol for additional pain relief.  Follow-up with orthopedics if no improvement 1 week.  Return to the ER for any worsening symptoms or urgent changes in your health.

## 2020-04-09 NOTE — ED Provider Notes (Signed)
[facility] DEPART[facility] Provider Note   CSN: [id number] Arrival date & time: 04/09/20  1409     History Chief Complaint  Patient presents with  . Leg Pain    Danny Bauer is a 40 y.o. male presents to the emergency department for evaluation of left thigh pain.  He describes a sharp pain with weightbearing activity that is intermittent.  There is no burning numbness or tingling.  No back pain.  No warmth redness or swelling.  He denies any trauma or injury no limitations in ambulation.  He takes Mobic daily along with Flexeril.  Has a history of right hip osteoarthritis.  Uncertain if he has any left hip osteoarthritis.  Pain is moderate, intermittent in the deep mid to distal lateral thigh.  HPI     Past Medical History:  Diagnosis Date  . Diabetes mellitus without complication ([place])   . GERD (gastroesophageal reflux disease)   . Hypertension   . Sleep apnea, obstructive    sees Dr. Melissa Montane     Patient Active Problem List   Diagnosis Date Noted  . Allergy to bee sting 03/24/2019  . Encounter to establish care 02/25/2019  . Right knee pain 02/25/2019  . Type 2 diabetes mellitus without complication, without long-term current use of insulin ([place]) 05/27/2016  . Gout 05/30/2014  . MORBID OBESITY 12/17/2007  . Sleep apnea 12/17/2007  . CARPAL TUNNEL SYNDROME 07/01/2007  . Essential hypertension 10/07/2006  . ASTHMA 08/27/2006  . GERD 08/27/2006    Past Surgical History:  Procedure Laterality Date  . NOSE SURGERY  2009   straighten nasal passage   . TOE AMPUTATION  2001   right great toe       Family History  Problem Relation Age of Onset  . Heart failure Mother   . Heart disease Mother   . Diabetes Mother   . Heart attack Father   . Heart disease Father     Social History   Tobacco Use  . Smoking status: Never Smoker  . Smokeless tobacco: Never Used  Vaping Use  . Vaping Use: Never used  Substance Use Topics  .  Alcohol use: No  . Drug use: No    Home Medications Prior to Admission medications   Medication Sig Start Date End Date Taking? Authorizing Provider  traMADol (ULTRAM) 50 MG tablet Take 1 tablet (50 mg total) by mouth every 6 (six) hours as needed. 04/09/20 04/09/21 Yes Duanne Guess, PA-C  allopurinol (ZYLOPRIM) 300 MG tablet Take 1 tablet (300 mg total) by mouth every morning. 02/13/18   Laurey Morale, MD  blood glucose meter kit and supplies KIT Dispense based on patient and insurance preference. Use up to four times daily as directed. (FOR ICD-9 250.00, 250.01). 02/25/19   Iloabachie, Chioma E, NP  diltiazem (CARDIZEM) 90 MG tablet Take 1 tablet (90 mg total) by mouth daily. 02/25/19   Iloabachie, Chioma E, NP  Dulaglutide (TRULICITY) 1.5 BO/1.7PZ SOPN Inject 1.5 mg into the skin once a week. 06/15/19   Iloabachie, Chioma E, NP  EPINEPHrine 0.3 mg/0.3 mL IJ SOAJ injection Inject 0.3 mLs (0.3 mg total) into the muscle as needed for anaphylaxis. 03/24/19   Iloabachie, Chioma E, NP  glipiZIDE (GLUCOTROL) 5 MG tablet Take 1 tablet (5 mg total) by mouth 2 (two) times daily before a meal. 06/15/19   Iloabachie, Chioma E, NP  glucose blood (ONETOUCH VERIO) test strip 1 each by Other route daily. And lancets  1/day 03/05/18   Renato Shin, MD  lisinopril-hydrochlorothiazide (ZESTORETIC) 20-25 MG tablet Take 1 tablet by mouth daily. 02/25/19   Iloabachie, Chioma E, NP  metFORMIN (GLUCOPHAGE) 1000 MG tablet Take 1 tablet (1,000 mg total) by mouth 2 (two) times daily with a meal. 02/25/19   Iloabachie, Chioma E, NP  methocarbamol (ROBAXIN) 500 MG tablet Take 1 tablet (500 mg total) by mouth every 8 (eight) hours as needed for muscle spasms. 06/15/19   Iloabachie, Chioma E, NP  omeprazole (PRILOSEC) 20 MG capsule Take 1 capsule (20 mg total) by mouth every morning. 06/15/19   Iloabachie, Chioma E, NP  potassium chloride (KLOR-CON 10) 10 MEQ tablet Take 1 tablet (10 mEq total) by mouth daily. 06/15/19   Iloabachie,  Chioma E, NP    Allergies    Bee venom, Codeine, Hydrocodone-acetaminophen, and Prednisone  Review of Systems   Review of Systems  Constitutional: Negative for chills and fever.  Respiratory: Negative for shortness of breath.   Cardiovascular: Negative for chest pain.  Gastrointestinal: Negative for abdominal pain, nausea and vomiting.  Musculoskeletal: Positive for arthralgias and myalgias. Negative for back pain, gait problem and joint swelling.  Skin: Negative for rash and wound.  Neurological: Negative for dizziness and headaches.    Physical Exam Updated Vital Signs BP (!) 175/89   Pulse 86   Temp 98 F (36.7 C) (Oral)   Resp 18   Ht '6\' 2"'  (1.88 m)   Wt (!) 175 kg   SpO2 98%   BMI 49.53 kg/m   Physical Exam Constitutional:      Appearance: He is well-developed.  HENT:     Head: Normocephalic and atraumatic.  Eyes:     Conjunctiva/sclera: Conjunctivae normal.  Cardiovascular:     Rate and Rhythm: Normal rate.  Pulmonary:     Effort: Pulmonary effort is normal. No respiratory distress.  Musculoskeletal:     Cervical back: Normal range of motion.     Comments: Patient ambulates with non antalgic gait and no assistive devices.  No current pain with ambulation in the room.  Examination of the left hip shows no tenderness of the trochanteric bursa.  No tenderness along the distal or distal lateral thigh.  No tenderness or swelling throughout the knee.  He has no swelling or edema or tenderness throughout the lower leg and calf.  Patient has stiffness of the hip with internal rotation which reproduces her groin and thigh pain.  He has similar stiffness in the right hip with internal rotation which reproduces some mild pain.  No back or sacral tenderness on exam.  Skin:    General: Skin is warm.     Findings: No rash.  Neurological:     Mental Status: He is alert and oriented to person, place, and time.  Psychiatric:        Behavior: Behavior normal.        Thought  Content: Thought content normal.     ED Results / Procedures / Treatments   Labs (all labs ordered are listed, but only abnormal results are displayed) Labs Reviewed - No data to display  EKG None  Radiology DG Hip Unilat W or Wo Pelvis 2-3 Views Left  Result Date: 04/09/2020 CLINICAL DATA:  Left hip pain EXAM: DG HIP (WITH OR WITHOUT PELVIS) 2-3V LEFT COMPARISON:  None. FINDINGS: No fracture or dislocation of the left hip or pelvis. There is moderate superior joint space narrowing and osteophytosis, possibly with aspherical deformity of the femoral head  neck junction. There is asymmetrically severe right hip arthrosis, seen in frontal view only. IMPRESSION: 1. There is moderate superior joint space narrowing and osteophytosis, possibly with aspherical deformity of the femoral head neck junction. Nonemergent CT or MRI may be used to further assess morphology of the femoral head and neck. 2. There is asymmetrically severe right hip arthrosis, seen in frontal view only. Electronically Signed   By: Eddie Candle M.D.   On: 04/09/2020 15:16    Procedures Procedures   Medications Ordered in ED Medications - No data to display  ED Course  I have reviewed the triage vital signs and the nursing notes.  Pertinent labs & imaging results that were available during my care of the patient were reviewed by me and considered in my medical decision making (see chart for details).    MDM Rules/Calculators/A&P                          40 year old male with history of none right hip osteoarthritis.  Is trying to put off on total hip arthroplasty.  He reports nontraumatic left thigh pain x2 weeks.  History and exam consistent with left hip osteoarthritis.  X-rays confirm left hip osteoarthritis.  We will continue with Mobic, Flexeril.  He is given a prescription for tramadol for breakthrough pain.  He is educated on using a cane for ambulation to offload the hip, weight loss and blood sugar control.  He  will follow-up with his orthopedist.  He understands signs symptoms return to the ER for. Final Clinical Impression(s) / ED Diagnoses Final diagnoses:  Left leg pain  Primary osteoarthritis of left hip    Rx / DC Orders ED Discharge Orders         Ordered    traMADol (ULTRAM) 50 MG tablet  Every 6 hours PRN        04/09/20 1520           Renata Caprice 04/09/20 1525    Delman Kitten, MD 04/10/20 1308

## 2020-04-09 NOTE — ED Notes (Signed)
Patient transported to X-ray 

## 2020-10-26 ENCOUNTER — Encounter: Payer: Self-pay | Admitting: Emergency Medicine

## 2020-10-26 ENCOUNTER — Other Ambulatory Visit: Payer: Self-pay

## 2020-10-26 ENCOUNTER — Emergency Department
Admission: EM | Admit: 2020-10-26 | Discharge: 2020-10-26 | Disposition: A | Discharge status: Home / Self Care | Payer: BC Managed Care – PPO | Attending: Emergency Medicine | Admitting: Emergency Medicine

## 2020-10-26 DIAGNOSIS — J45909 Unspecified asthma, uncomplicated: Secondary | ICD-10-CM | POA: Insufficient documentation

## 2020-10-26 DIAGNOSIS — Z7984 Long term (current) use of oral hypoglycemic drugs: Secondary | ICD-10-CM | POA: Insufficient documentation

## 2020-10-26 DIAGNOSIS — Z79899 Other long term (current) drug therapy: Secondary | ICD-10-CM | POA: Insufficient documentation

## 2020-10-26 DIAGNOSIS — I10 Essential (primary) hypertension: Secondary | ICD-10-CM | POA: Diagnosis not present

## 2020-10-26 DIAGNOSIS — E119 Type 2 diabetes mellitus without complications: Secondary | ICD-10-CM | POA: Insufficient documentation

## 2020-10-26 DIAGNOSIS — L959 Vasculitis limited to the skin, unspecified: Secondary | ICD-10-CM | POA: Insufficient documentation

## 2020-10-26 DIAGNOSIS — R21 Rash and other nonspecific skin eruption: Secondary | ICD-10-CM | POA: Diagnosis present

## 2020-10-26 LAB — CBC WITH DIFFERENTIAL/PLATELET
Abs Immature Granulocytes: 0.04 10*3/uL (ref 0.00–0.07)
Basophils Absolute: 0 10*3/uL (ref 0.0–0.1)
Basophils Relative: 1 %
Eosinophils Absolute: 0.1 10*3/uL (ref 0.0–0.5)
Eosinophils Relative: 1 %
HCT: 43.6 % (ref 39.0–52.0)
Hemoglobin: 15.1 g/dL (ref 13.0–17.0)
Immature Granulocytes: 1 %
Lymphocytes Relative: 16 %
Lymphs Abs: 1.2 10*3/uL (ref 0.7–4.0)
MCH: 28.7 pg (ref 26.0–34.0)
MCHC: 34.6 g/dL (ref 30.0–36.0)
MCV: 82.9 fL (ref 80.0–100.0)
Monocytes Absolute: 0.6 10*3/uL (ref 0.1–1.0)
Monocytes Relative: 9 %
Neutro Abs: 5.3 10*3/uL (ref 1.7–7.7)
Neutrophils Relative %: 72 %
Platelets: 146 10*3/uL — ABNORMAL LOW (ref 150–400)
RBC: 5.26 MIL/uL (ref 4.22–5.81)
RDW: 14.5 % (ref 11.5–15.5)
WBC: 7.2 10*3/uL (ref 4.0–10.5)
nRBC: 0 % (ref 0.0–0.2)

## 2020-10-26 LAB — SEDIMENTATION RATE: Sed Rate: 30 mm/hr — ABNORMAL HIGH (ref 0–15)

## 2020-10-26 LAB — BASIC METABOLIC PANEL
Anion gap: 6 (ref 5–15)
BUN: 13 mg/dL (ref 6–20)
CO2: 26 mmol/L (ref 22–32)
Calcium: 9.2 mg/dL (ref 8.9–10.3)
Chloride: 103 mmol/L (ref 98–111)
Creatinine, Ser: 0.59 mg/dL — ABNORMAL LOW (ref 0.61–1.24)
GFR, Estimated: >60 mL/min (ref >60)
Glucose, Bld: 261 mg/dL — ABNORMAL HIGH (ref 70–99)
Potassium: 3.8 mmol/L (ref 3.5–5.1)
Sodium: 135 mmol/L (ref 135–145)

## 2020-10-26 NOTE — ED Provider Notes (Signed)
[facility] Emergency Department Provider Note ____________________________________________  Time seen: 2105  I have reviewed the triage vital signs and the nursing notes.  HISTORY  Chief Complaint  Rash   HPI Danny Bauer is a 40 y.o. male presents to the ED for a 2-day complaint of rash.  Patient with the below medical history reports a itchy rash without drainage to the anterior left shin, that he noted about 2 days ago.  Denies any known exposures, allergens, or sensitivities.  He denies any fevers or chills and denies any other rash at this time.  Past Medical History:  Diagnosis Date   Diabetes mellitus without complication ([place])    GERD (gastroesophageal reflux disease)    Hypertension    Sleep apnea, obstructive    sees Dr. Melissa Montane     Patient Active Problem List   Diagnosis Date Noted   Allergy to bee sting 03/24/2019   Encounter to establish care 02/25/2019   Right knee pain 02/25/2019   Type 2 diabetes mellitus without complication, without long-term current use of insulin ([place]) 05/27/2016   Gout 05/30/2014   MORBID OBESITY 12/17/2007   Sleep apnea 12/17/2007   CARPAL TUNNEL SYNDROME 07/01/2007   Essential hypertension 10/07/2006   ASTHMA 08/27/2006   GERD 08/27/2006    Past Surgical History:  Procedure Laterality Date   NOSE SURGERY  2009   straighten nasal passage    TOE AMPUTATION  2001   right great toe    Prior to Admission medications   Medication Sig Start Date End Date Taking? Authorizing Provider  allopurinol (ZYLOPRIM) 300 MG tablet Take 1 tablet (300 mg total) by mouth every morning. 02/13/18   Laurey Morale, MD  blood glucose meter kit and supplies KIT Dispense based on patient and insurance preference. Use up to four times daily as directed. (FOR ICD-9 250.00, 250.01). 02/25/19   Iloabachie, Chioma E, NP  diltiazem (CARDIZEM) 90 MG tablet Take 1 tablet (90 mg total) by mouth daily. 02/25/19   Iloabachie, Chioma E,  NP  Dulaglutide (TRULICITY) 1.5 UQ/3.3HL SOPN Inject 1.5 mg into the skin once a week. 06/15/19   Iloabachie, Chioma E, NP  EPINEPHrine 0.3 mg/0.3 mL IJ SOAJ injection Inject 0.3 mLs (0.3 mg total) into the muscle as needed for anaphylaxis. 03/24/19   Iloabachie, Chioma E, NP  glipiZIDE (GLUCOTROL) 5 MG tablet Take 1 tablet (5 mg total) by mouth 2 (two) times daily before a meal. 06/15/19   Iloabachie, Chioma E, NP  glucose blood (ONETOUCH VERIO) test strip 1 each by Other route daily. And lancets 1/day 03/05/18   Renato Shin, MD  lisinopril-hydrochlorothiazide (ZESTORETIC) 20-25 MG tablet Take 1 tablet by mouth daily. 02/25/19   Iloabachie, Chioma E, NP  metFORMIN (GLUCOPHAGE) 1000 MG tablet Take 1 tablet (1,000 mg total) by mouth 2 (two) times daily with a meal. 02/25/19   Iloabachie, Chioma E, NP  methocarbamol (ROBAXIN) 500 MG tablet Take 1 tablet (500 mg total) by mouth every 8 (eight) hours as needed for muscle spasms. 06/15/19   Iloabachie, Chioma E, NP  omeprazole (PRILOSEC) 20 MG capsule Take 1 capsule (20 mg total) by mouth every morning. 06/15/19   Iloabachie, Chioma E, NP  potassium chloride (KLOR-CON 10) 10 MEQ tablet Take 1 tablet (10 mEq total) by mouth daily. 06/15/19   Iloabachie, Chioma E, NP  traMADol (ULTRAM) 50 MG tablet Take 1 tablet (50 mg total) by mouth every 6 (six) hours as needed. 04/09/20 04/09/21  Arvella Nigh,  Thomas C, PA-C    Allergies Bee venom, Codeine, Hydrocodone-acetaminophen, and Prednisone  Family History  Problem Relation Age of Onset   Heart failure Mother    Heart disease Mother    Diabetes Mother    Heart attack Father    Heart disease Father     Social History Social History   Tobacco Use   Smoking status: Never   Smokeless tobacco: Never  Vaping Use   Vaping Use: Never used  Substance Use Topics   Alcohol use: No   Drug use: No    Review of Systems  Constitutional: Negative for fever. Eyes: Negative for visual changes. ENT: Negative for sore  throat. Cardiovascular: Negative for chest pain. Respiratory: Negative for shortness of breath. Gastrointestinal: Negative for abdominal pain, vomiting and diarrhea. Genitourinary: Negative for dysuria. Musculoskeletal: Negative for back pain. Skin: Positive for rash. Neurological: Negative for headaches, focal weakness or numbness. ____________________________________________  PHYSICAL EXAM:  VITAL SIGNS: ED Triage Vitals  Enc Vitals Group     BP 10/26/20 1921 (!) 167/80     Pulse Rate 10/26/20 1921 85     Resp 10/26/20 1921 20     Temp 10/26/20 1921 99.1 F (37.3 C)     Temp Source 10/26/20 1921 Oral     SpO2 10/26/20 1921 95 %     Weight 10/26/20 1925 (!) 395 lb (179.2 kg)     Height 10/26/20 1925 '6\' 2"'  (1.88 m)     Head Circumference --      Peak Flow --      Pain Score 10/26/20 1924 0     Pain Loc --      Pain Edu? --      Excl. in [place]? --     Constitutional: Alert and oriented. Well appearing and in no distress. Head: Normocephalic and atraumatic. Eyes: Conjunctivae are normal. Normal extraocular movements Mouth/Throat: Mucous membranes are moist. Neck: Supple. No thyromegaly Hematological/Lymphatic/Immunological: No cervical lymphadenopathy. Cardiovascular: Normal rate, regular rhythm. Normal distal pulses. Respiratory: Normal respiratory effort. No wheezes/rales/rhonchi. Gastrointestinal: Soft and nontender. No distention. Musculoskeletal: Nontender with normal range of motion in all extremities.  Neurologic:  Normal gait without ataxia. Normal speech and language. No gross focal neurologic deficits are appreciated. Skin:  Skin is warm, dry and intact.  Patient with petechial eruption to the anterior lateral left shin, that are nonpalpable and nonblanchable.  No skin dehiscence, blister or vesicle formation is noted. Psychiatric: Mood and affect are normal. Patient exhibits appropriate insight and judgment. ____________________________________________    {LABS  (pertinent positives/negatives)  Labs Reviewed  BASIC METABOLIC PANEL - Abnormal; Notable for the following components:      Result Value   Glucose, Bld 261 (*)    Creatinine, Ser 0.59 (*)    All other components within normal limits  CBC WITH DIFFERENTIAL/PLATELET - Abnormal; Notable for the following components:   Platelets 146 (*)    All other components within normal limits  SEDIMENTATION RATE - Abnormal; Notable for the following components:   Sed Rate 30 (*)    All other components within normal limits  C-REACTIVE PROTEIN  ____________________________________________  {EKG  ____________________________________________   RADIOLOGY Official radiology report(s): No results found. ____________________________________________  PROCEDURES   Procedures ____________________________________________   INITIAL IMPRESSION / ASSESSMENT AND PLAN / ED COURSE  As part of my medical decision making, I reviewed the following data within the [place] reviewed as noted and Notes from prior ED visits   DDX: ITP,  vasculitis, allergic reaction, contact dermatitis  Patient ED evaluation of a pruritic rash noted to the anterior left shin.  Patient presents with a nonpalpable eruption to the anterior lateral left shin, presenting as a petechial rash.  Patient was evaluated with labs and found to have a reassuring work-up with exception of an elevated sed rate at 30.  No other indications of any acute infectious process, chemical exposure, or signs of systemic vasculitis.  Patient is otherwise in stable condition, without's involvement, is discharged at this time to monitor for any changes.  Turn precautions of been reviewed.  Follow-up with primary provider return to the ED as discussed.  CORDERA STINEMAN was evaluated in Emergency Department on 10/27/2020 for the symptoms described in the history of present illness. He was evaluated in the context of the global COVID-19  pandemic, which necessitated consideration that the patient might be at risk for infection with the SARS-CoV-2 virus that causes COVID-19. Institutional protocols and algorithms that pertain to the evaluation of patients at risk for COVID-19 are in a state of rapid change based on information released by regulatory bodies including the CDC and federal and state organizations. These policies and algorithms were followed during the patient's care in the ED.  ____________________________________________  FINAL CLINICAL IMPRESSION(S) / ED DIAGNOSES  Final diagnoses:  Vasculitis of skin      Carmie End, Dannielle Karvonen, PA-C 10/27/20 0014    Vladimir Crofts, MD 10/29/20 0800

## 2020-10-26 NOTE — ED Notes (Signed)
See triage note. Rash to L leg. Pt in NAD.

## 2020-10-26 NOTE — ED Triage Notes (Signed)
Pt to ED from home c/o left lower leg rash 2 days ago, itching, denies drainage.   [place] anything.  Denies fevers or illnesses.  No other rash noted.  Pt A&Ox4, chest rise even and unlabored, in NAD at this time.

## 2020-10-26 NOTE — Discharge Instructions (Signed)
Your rash is concerning for an idiopathic vasculitis. It may resolve spontaneously. Continue to monitor for changes and return if needed.

## 2020-11-05 ENCOUNTER — Encounter: Payer: Self-pay | Admitting: Emergency Medicine

## 2020-11-05 ENCOUNTER — Emergency Department: Payer: BC Managed Care – PPO

## 2020-11-05 ENCOUNTER — Other Ambulatory Visit: Payer: Self-pay

## 2020-11-05 ENCOUNTER — Emergency Department
Admission: EM | Admit: 2020-11-05 | Discharge: 2020-11-05 | Disposition: A | Discharge status: Home / Self Care | Payer: BC Managed Care – PPO | Attending: Emergency Medicine | Admitting: Emergency Medicine

## 2020-11-05 DIAGNOSIS — L03116 Cellulitis of left lower limb: Secondary | ICD-10-CM | POA: Diagnosis not present

## 2020-11-05 DIAGNOSIS — E119 Type 2 diabetes mellitus without complications: Secondary | ICD-10-CM | POA: Diagnosis not present

## 2020-11-05 DIAGNOSIS — Z7985 Long-term (current) use of injectable non-insulin antidiabetic drugs: Secondary | ICD-10-CM | POA: Diagnosis not present

## 2020-11-05 DIAGNOSIS — I10 Essential (primary) hypertension: Secondary | ICD-10-CM | POA: Diagnosis not present

## 2020-11-05 DIAGNOSIS — Z7984 Long term (current) use of oral hypoglycemic drugs: Secondary | ICD-10-CM | POA: Insufficient documentation

## 2020-11-05 DIAGNOSIS — R21 Rash and other nonspecific skin eruption: Secondary | ICD-10-CM | POA: Diagnosis present

## 2020-11-05 MED ORDER — CEPHALEXIN 500 MG PO CAPS: 500.0000 mg | ORAL_CAPSULE | Freq: Four times a day (QID) | ORAL | 0 refills | Status: AC

## 2020-11-05 MED ORDER — CEPHALEXIN 500 MG PO CAPS
500.0000 mg | ORAL_CAPSULE | Freq: Once | ORAL | Status: AC
  Administered 2020-11-05: 500 mg via ORAL
  Filled 2020-11-05: qty 1

## 2020-11-05 MED ORDER — TRAMADOL HCL 50 MG PO TABS: 50.0000 mg | ORAL_TABLET | Freq: Four times a day (QID) | ORAL | 0 refills | Status: DC | PRN

## 2020-11-05 NOTE — Discharge Instructions (Signed)
Follow-up with your regular doctor Return to emergency department worsening Take medication as prescribed Elevate the leg to aid in healing

## 2020-11-05 NOTE — ED Triage Notes (Signed)
Pt reports was seen here 2 weeks ago and dx'd with vasculitis and told to follow up. Pt reports rash is no better and he gets a new MD next week.

## 2020-11-05 NOTE — ED Provider Notes (Signed)
[facility] Emergency Department Provider Note  ____________________________________________   Event Date/Time   First MD Initiated Contact with Patient 11/05/20 1422     (approximate)  I have reviewed the triage vital signs and the nursing notes.   HISTORY  Chief Complaint Rash    HPI ADISON JERGER is a 40 y.o. male presents emergency department complaining of left lower leg swelling and redness.  States that last week he had swelling in the right leg.  This week its the left.  States it is much worse.  Is becoming painful.  No fever or chills.  Past Medical History:  Diagnosis Date   Diabetes mellitus without complication ([place])    GERD (gastroesophageal reflux disease)    Hypertension    Sleep apnea, obstructive    sees Dr. 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 Authorizing Provider  cephALEXin (KEFLEX) 500 MG capsule Take 1 capsule (500 mg total) by mouth 4 (four) times daily for 10 days. 11/05/20 11/15/20 Yes Joleigh Mineau, Linden Dolin, PA-C  traMADol (ULTRAM) 50 MG tablet Take 1 tablet (50 mg total) by mouth every 6 (six) hours as needed for severe pain. 11/05/20  Yes Alejandra Barna, Linden Dolin, PA-C  allopurinol (ZYLOPRIM) 300 MG tablet Take 1 tablet (300 mg total) by mouth every morning. 02/13/18   Laurey Morale, MD   blood glucose meter kit and supplies KIT Dispense based on patient and insurance preference. Use up to four times daily as directed. (FOR ICD-9 250.00, 250.01). 02/25/19   Iloabachie, Chioma E, NP  diltiazem (CARDIZEM) 90 MG tablet Take 1 tablet (90 mg total) by mouth daily. 02/25/19   Iloabachie, Chioma E, NP  Dulaglutide (TRULICITY) 1.5 GY/1.7CB SOPN Inject 1.5 mg into the skin once a week. 06/15/19   Iloabachie, Chioma E, NP  EPINEPHrine 0.3 mg/0.3 mL IJ SOAJ injection Inject 0.3 mLs (0.3 mg total) into the muscle as needed for anaphylaxis. 03/24/19   Iloabachie, Chioma E, NP  glipiZIDE (GLUCOTROL) 5 MG tablet Take 1 tablet (5 mg total) by mouth 2 (two) times daily before a meal. 06/15/19   Iloabachie, Chioma E, NP  glucose blood (ONETOUCH VERIO) test strip 1 each by Other route daily. And lancets 1/day 03/05/18   Renato Shin, MD  lisinopril-hydrochlorothiazide (ZESTORETIC) 20-25 MG tablet Take 1 tablet by mouth daily. 02/25/19   Iloabachie, Chioma E, NP  metFORMIN (GLUCOPHAGE) 1000 MG tablet Take 1 tablet (1,000 mg total) by mouth 2 (two) times daily with a meal. 02/25/19   Iloabachie, Chioma E, NP  methocarbamol (ROBAXIN) 500 MG tablet Take 1 tablet (500 mg total) by mouth every 8 (eight) hours as needed for muscle spasms. 06/15/19   Iloabachie, Chioma E, NP  omeprazole (PRILOSEC) 20 MG capsule Take 1 capsule (  20 mg total) by mouth every morning. 06/15/19   Iloabachie, Chioma E, NP  potassium chloride (KLOR-CON 10) 10 MEQ tablet Take 1 tablet (10 mEq total) by mouth daily. 06/15/19   Iloabachie, Chioma E, NP    Allergies Bee venom, Codeine, Hydrocodone-acetaminophen, and Prednisone  Family History  Problem Relation Age of Onset   Heart failure Mother    Heart disease Mother    Diabetes Mother    Heart attack Father    Heart disease Father     Social History Social History   Tobacco Use   Smoking status: Never   Smokeless tobacco: Never  Vaping Use   Vaping Use: Never used  Substance Use  Topics   Alcohol use: No   Drug use: No    Review of Systems  Constitutional: No fever/chills Eyes: No visual changes. ENT: No sore throat. Respiratory: Denies cough Cardiovascular: Denies chest pain Gastrointestinal: Denies abdominal pain Genitourinary: Negative for dysuria. Musculoskeletal: Negative for back pain.  Left lower leg pain Skin: Negative for rash. Psychiatric: no mood changes,     ____________________________________________   PHYSICAL EXAM:  VITAL SIGNS: ED Triage Vitals  Enc Vitals Group     BP 11/05/20 1337 (!) 153/89     Pulse Rate 11/05/20 1337 88     Resp 11/05/20 1337 20     Temp 11/05/20 1337 98.2 F (36.8 C)     Temp Source 11/05/20 1337 Oral     SpO2 11/05/20 1337 96 %     Weight 11/05/20 1336 (!) 390 lb (176.9 kg)     Height 11/05/20 1336 '6\' 2"'  (1.88 m)     Head Circumference --      Peak Flow --      Pain Score 11/05/20 1336 0     Pain Loc --      Pain Edu? --      Excl. in [place]? --     Constitutional: Alert and oriented. Well appearing and in no acute distress. Eyes: Conjunctivae are normal.  Head: Atraumatic. Nose: No congestion/rhinnorhea. Mouth/Throat: Mucous membranes are moist.   Neck:  supple no lymphadenopathy noted Cardiovascular: Normal rate, regular rhythm.  Respiratory: Normal respiratory effort.  No retractions,  GU: deferred Musculoskeletal: FROM all extremities, warm and well perfused, left lower leg has redness swelling and tenderness along the posterior and anterior, warm to touch, neurovascular appears to be intact Neurologic:  Normal speech and language.  Skin:  Skin is warm, dry and intact. No rash noted. Psychiatric: Mood and affect are normal. Speech and behavior are normal.  ____________________________________________   LABS (all labs ordered are listed, but only abnormal results are displayed)  Labs Reviewed - No data to  display ____________________________________________   ____________________________________________  RADIOLOGY  Ultrasound left lower leg  ____________________________________________   PROCEDURES  Procedure(s) performed: No  Procedures    ____________________________________________   INITIAL IMPRESSION / ASSESSMENT AND PLAN / ED COURSE  Pertinent labs & imaging results that were available during my care of the patient were reviewed by me and considered in my medical decision making (see chart for details).   Patient is a 40 year old male presents emergency department with concerns of left leg swelling.  See HPI.  Physical exam shows patient per stable.  We will do ultrasound of the left lower extremity to rule out DVT.  Ultrasound left lower extremity is negative for DVT.  Reviewed by me confirmed by radiology  Patient was diagnosed cellulitis.  He was given a prescription for  Keflex.  Given a dose here in the ED.  He is to elevate the leg.  Return emergency department worsening.  Discharged in stable condition.  SERENITY FORTNER was evaluated in Emergency Department on 11/05/2020 for the symptoms described in the history of present illness. He was evaluated in the context of the global COVID-19 pandemic, which necessitated consideration that the patient might be at risk for infection with the SARS-CoV-2 virus that causes COVID-19. Institutional protocols and algorithms that pertain to the evaluation of patients at risk for COVID-19 are in a state of rapid change based on information released by regulatory bodies including the CDC and federal and state organizations. These policies and algorithms were followed during the patient's care in the ED.    As part of my medical decision making, I reviewed the following data within the [place] notes reviewed and incorporated, Old chart reviewed, Radiograph reviewed , Notes from prior ED visits, and [place]  Controlled Substance Database  ____________________________________________   FINAL CLINICAL IMPRESSION(S) / ED DIAGNOSES  Final diagnoses:  Cellulitis of left lower extremity      NEW MEDICATIONS STARTED DURING THIS VISIT:  New Prescriptions   CEPHALEXIN (KEFLEX) 500 MG CAPSULE    Take 1 capsule (500 mg total) by mouth 4 (four) times daily for 10 days.   TRAMADOL (ULTRAM) 50 MG TABLET    Take 1 tablet (50 mg total) by mouth every 6 (six) hours as needed for severe pain.     Note:  This document was prepared using Dragon voice recognition software and may include unintentional dictation errors.    Versie Starks, PA-C 11/05/20 1529    Harvest Dark, MD 11/06/20 2253

## 2020-12-13 ENCOUNTER — Ambulatory Visit (INDEPENDENT_AMBULATORY_CARE_PROVIDER_SITE_OTHER): Payer: BC Managed Care – PPO | Admitting: Urology

## 2020-12-13 ENCOUNTER — Encounter: Payer: Self-pay | Admitting: Urology

## 2020-12-13 ENCOUNTER — Other Ambulatory Visit: Payer: Self-pay

## 2020-12-13 VITALS — BP 148/86 | HR 87 | Ht 74.0 in | Wt 365.0 lb

## 2020-12-13 DIAGNOSIS — N476 Balanoposthitis: Secondary | ICD-10-CM

## 2020-12-13 MED ORDER — CLOTRIMAZOLE-BETAMETHASONE 1-0.05 % EX CREA: 1.0000 "application " | TOPICAL_CREAM | Freq: Two times a day (BID) | CUTANEOUS | 0 refills | Status: AC

## 2020-12-13 NOTE — Progress Notes (Signed)
12/13/2020 3:48 PM   [place] Parma 04-10-1980 [id number]  Referring provider: No referring provider defined for this encounter.  Chief Complaint  Patient presents with   Other    HPI: Danny Bauer is a 40 y.o. male who presents for evaluation of painful foreskin.  2-day history of irritation/discomfort of foreskin with redness No difficulty retracting No bothersome LUTS Denies previous episodes + diabetes   PMH: Past Medical History:  Diagnosis Date   Diabetes mellitus without complication (HCC)    GERD (gastroesophageal reflux disease)    Hypertension    Sleep apnea, obstructive    sees Dr. Melissa Montane     Surgical History: Past Surgical History:  Procedure Laterality Date   NOSE SURGERY  2009   straighten nasal passage    TOE AMPUTATION  2001   right great toe    Home Medications:  Allergies as of 12/13/2020       Reactions   Bee Venom Anaphylaxis   Codeine Diarrhea   Hydrocodone-acetaminophen Diarrhea   Prednisone Diarrhea        Medication List        Accurate as of December 13, 2020  3:48 PM. If you have any questions, ask your nurse or doctor.          STOP taking these medications    allopurinol 300 MG tablet Commonly known as: ZYLOPRIM Stopped by: Abbie Sons, MD   methocarbamol 500 MG tablet Commonly known as: Robaxin Stopped by: Abbie Sons, MD   potassium chloride 10 MEQ tablet Commonly known as: Klor-Con 10 Stopped by: Abbie Sons, MD   traMADol 50 MG tablet Commonly known as: ULTRAM Stopped by: Abbie Sons, MD   Trulicity 1.5 WL/8.9HT Sopn Generic drug: Dulaglutide Stopped by: Abbie Sons, MD       TAKE these medications    blood glucose meter kit and supplies Kit Dispense based on patient and insurance preference. Use up to four times daily as directed. (FOR ICD-9 250.00, 250.01).   diltiazem 90 MG tablet Commonly known as: CARDIZEM Take 1 tablet (90 mg total) by mouth daily.    EPINEPHrine 0.3 mg/0.3 mL Soaj injection Commonly known as: EPI-PEN Inject 0.3 mLs (0.3 mg total) into the muscle as needed for anaphylaxis.   glipiZIDE 5 MG tablet Commonly known as: GLUCOTROL Take 1 tablet (5 mg total) by mouth 2 (two) times daily before a meal.   glucose blood test strip Commonly known as: OneTouch Verio 1 each by Other route daily. And lancets 1/day   lisinopril-hydrochlorothiazide 20-25 MG tablet Commonly known as: ZESTORETIC Take 1 tablet by mouth daily.   metFORMIN 1000 MG tablet Commonly known as: GLUCOPHAGE Take 1 tablet (1,000 mg total) by mouth 2 (two) times daily with a meal.   omeprazole 20 MG capsule Commonly known as: PRILOSEC Take 1 capsule (20 mg total) by mouth every morning.        Allergies:  Allergies  Allergen Reactions   Bee Venom Anaphylaxis   Codeine Diarrhea   Hydrocodone-Acetaminophen Diarrhea   Prednisone Diarrhea    Family History: Family History  Problem Relation Age of Onset   Heart failure Mother    Heart disease Mother    Diabetes Mother    Heart attack Father    Heart disease Father     Social History:  reports that he has never smoked. He has never used smokeless tobacco. He reports that he does not drink alcohol and does not use  drugs.   Physical Exam: BP (!) 148/86   Pulse 87   Ht '6\' 2"'  (1.88 m)   Wt (!) 365 lb (165.6 kg)   BMI 46.86 kg/m   Constitutional:  Alert and oriented, No acute distress. HEENT: [place] AT, moist mucus membranes.  Trachea midline, no masses. Cardiovascular: No clubbing, cyanosis, or edema. GU: Prepuce erythematous and slightly thickened.  Foreskin easily retracts.  Glans erythematous Skin: As above Psychiatric: Normal mood and affect.   Assessment & Plan:    1.  Balanoposthitis Rx Lotrisone cream sent to pharmacy to apply twice daily x7 days Instructed to replace foreskin over glans after applying cream Return for persistent or recurrent symptoms   Abbie Sons,  MD  [facility] [address] [place], [place] [address] [phone]

## 2021-10-19 ENCOUNTER — Emergency Department
Admission: EM | Admit: 2021-10-19 | Discharge: 2021-10-19 | Disposition: A | Discharge status: Home / Self Care | Payer: BC Managed Care – PPO | Attending: Emergency Medicine | Admitting: Emergency Medicine

## 2021-10-19 DIAGNOSIS — Z91030 Bee allergy status: Secondary | ICD-10-CM | POA: Insufficient documentation

## 2021-10-19 DIAGNOSIS — T7840XA Allergy, unspecified, initial encounter: Secondary | ICD-10-CM | POA: Insufficient documentation

## 2021-10-19 MED ORDER — DIPHENHYDRAMINE HCL 25 MG PO CAPS
50.0000 mg | ORAL_CAPSULE | Freq: Once | ORAL | Status: AC
  Administered 2021-10-19: 50 mg via ORAL
  Filled 2021-10-19: qty 2

## 2021-10-19 MED ORDER — METHYLPREDNISOLONE 4 MG PO TABS
2.0000 mg | ORAL_TABLET | Freq: Once | ORAL | Status: AC
  Administered 2021-10-19: 2 mg via ORAL
  Filled 2021-10-19: qty 1

## 2021-10-19 MED ORDER — EPINEPHRINE 0.3 MG/0.3ML IJ SOAJ: 0.3000 mg | INTRAMUSCULAR | 0 refills | Status: AC | PRN

## 2021-10-19 MED ORDER — METHYLPREDNISOLONE 4 MG PO TBPK: ORAL_TABLET | ORAL | 0 refills | Status: DC

## 2021-10-19 NOTE — ED Notes (Signed)
Mild redness to right foot noted. No swelling noted at this time

## 2021-10-19 NOTE — ED Triage Notes (Signed)
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 Pt denies difficulty breathing or oral inflammation. Pt has an Epi-Pen and has not used it. Pt has a localized rash on his right foot and mild swelling/redness on the right cheek. Pt reports sting took place at roughly 1200. Pt denies taking any antihistamines prior to arrival.

## 2021-10-19 NOTE — Discharge Instructions (Addendum)
Please use Benadryl (diphenhydramine) 25-50 mg every 8 hours for the next 72 hours

## 2021-10-19 NOTE — ED Provider Notes (Signed)
[facility] Provider Note   Event Date/Time   First MD Initiated Contact with Patient 10/19/21 1542     (approximate) History  Allergic Reaction and Insect Bite  HPI Danny Bauer is a 41 y.o. male with a stated past medical history of anaphylaxis to bee stings who presents after a bee sting to the lateral aspect of the right foot that had subsequent facial swelling and redness.  Patient states that he had an EpiPen but did not have it with him and therefore did not administer it. ROS: Patient currently denies any difficulty swallowing/breathing, tongue swelling, vision changes, tinnitus, difficulty speaking, facial droop, sore throat, chest pain, shortness of breath, abdominal pain, nausea/vomiting/diarrhea, dysuria, or weakness/numbness/paresthesias in any extremity   Physical Exam  Triage Vital Signs: ED Triage Vitals  Enc Vitals Group     BP 10/19/21 1333 (!) 118/57     Pulse Rate 10/19/21 1333 90     Resp 10/19/21 1333 (!) 22     Temp 10/19/21 1330 98.4 F (36.9 C)     Temp Source 10/19/21 1330 Oral     SpO2 10/19/21 1333 94 %     Weight --      Height --      Head Circumference --      Peak Flow --      Pain Score 10/19/21 1332 3     Pain Loc --      Pain Edu? --      Excl. in [place]? --    Most recent vital signs: Vitals:   10/19/21 1333 10/19/21 1540  BP: (!) 118/57 134/72  Pulse: 90 (!) 102  Resp: (!) 22 16  Temp:  98.7 F (37.1 C)  SpO2: 94% 95%   General: Awake, oriented x4. CV:  Good peripheral perfusion.  Resp:  Normal effort.  Abd:  No distention.  Other:  Morbidly obese Caucasian middle-aged male sitting in bed in no acute distress.  There is a small area of erythema to the lateral aspect of the right foot. ED Results / Procedures / Treatments   PROCEDURES: Critical Care performed: No .1-3 Lead EKG Interpretation  Performed by: Naaman Plummer, MD Authorized by: Naaman Plummer, MD     Interpretation: normal     ECG rate:   101   ECG rate assessment: tachycardic     Rhythm: sinus tachycardia     Ectopy: none     Conduction: normal    MEDICATIONS ORDERED IN ED: Medications  diphenhydrAMINE (BENADRYL) capsule 50 mg (50 mg Oral Given 10/19/21 1425)  methylPREDNISolone (MEDROL) tablet 2 mg (2 mg Oral Given 10/19/21 1425)   IMPRESSION / MDM / ASSESSMENT AND PLAN / ED COURSE  I reviewed the triage vital signs and the nursing notes.                             The patient is on the cardiac monitor to evaluate for evidence of arrhythmia and/or significant heart rate changes. Patient's presentation is most consistent with acute presentation with potential threat to life or bodily function. + Cutaneous hives/erythema No evidence of multiorgan involvement  Given history and exam, presentation most consistent with allergic reaction. I have low suspicion for toxic shock syndrome, anaphylaxis, asthma exacerbation, or drug toxicity. Rx: Medrol Dosepak, Benadryl 25mg  q8hr x3days Disposition: Discharge home with SRP. Follow up with PCP in 1-2 days.   FINAL CLINICAL IMPRESSION(S) / ED  DIAGNOSES   Final diagnoses:  Allergic reaction, initial encounter  Bee sting allergy   Rx / DC Orders   ED Discharge Orders          Ordered    methylPREDNISolone (MEDROL DOSEPAK) 4 MG TBPK tablet        10/19/21 1609    EPINEPHrine (EPIPEN 2-PAK) 0.3 mg/0.3 mL IJ SOAJ injection  As needed        10/19/21 1610           Note:  This document was prepared using Dragon voice recognition software and may include unintentional dictation errors.   Merwyn Katos, MD 10/19/21 [phone]

## 2021-10-19 NOTE — ED Provider Triage Note (Signed)
  Emergency Medicine Provider Triage Evaluation Note  Danny Bauer , a 41 y.o.male,  was evaluated in triage.  Pt complains of suspected allergic reaction.  Patient states that he had a bee sting on the right foot.  Has a history of anaphylaxis from bee stings back in 2002.  He states he has some rash and swelling along the right foot, otherwise feels well.   Review of Systems  Positive: Rash, swelling, foot pain Negative: Denies fever, chest pain, vomiting  Physical Exam   Vitals:   10/19/21 1330 10/19/21 1333  BP:  (!) 118/57  Pulse:  90  Resp:  (!) 22  Temp: 98.4 F (36.9 C)   SpO2:  94%   Gen:   Awake, no distress   Resp:  Normal effort  MSK:   Moves extremities without difficulty  Other:    Medical Decision Making  Given the patient's initial medical screening exam, the following diagnostic evaluation has been ordered. The patient will be placed in the appropriate treatment space, once one is available, to complete the evaluation and treatment. I have discussed the plan of care with the patient and I have advised the patient that an ED physician or mid-level practitioner will reevaluate their condition after the test results have been received, as the results may give them additional insight into the type of treatment they may need.    Diagnostics: None immediately  Treatments: Diphenhydramine, methylprednisolone.   Teodoro Spray, [place] 10/19/21 [phone]

## 2021-11-23 ENCOUNTER — Encounter: Payer: Self-pay | Admitting: Podiatry

## 2021-11-23 ENCOUNTER — Ambulatory Visit (INDEPENDENT_AMBULATORY_CARE_PROVIDER_SITE_OTHER): Payer: BC Managed Care – PPO | Admitting: Podiatry

## 2021-11-23 DIAGNOSIS — B351 Tinea unguium: Secondary | ICD-10-CM

## 2021-11-23 DIAGNOSIS — M79675 Pain in left toe(s): Secondary | ICD-10-CM

## 2021-11-23 DIAGNOSIS — E119 Type 2 diabetes mellitus without complications: Secondary | ICD-10-CM | POA: Diagnosis not present

## 2021-11-23 DIAGNOSIS — M79674 Pain in right toe(s): Secondary | ICD-10-CM

## 2021-11-23 NOTE — Progress Notes (Signed)
   Chief Complaint  Patient presents with   Nail Problem    "I've got the fungus and the left big toenail hurts." N - painful toenail and fungus L - hallux left,  D - couple weeks painful toenail, fungus - 2 years O - gradually worse C - sharp pain, discolored, thick  A - pressure T - none    SUBJECTIVE Patient with a history of diabetes mellitus presents to office today complaining of elongated, thickened nails that cause pain while ambulating in shoes.  Patient is unable to trim their own nails. Patient is here for further evaluation and treatment.  Past Medical History:  Diagnosis Date   Diabetes mellitus without complication (HCC)    GERD (gastroesophageal reflux disease)    Hypertension    Sleep apnea, obstructive    sees Dr. Suzanna Obey     Allergies  Allergen Reactions   Bee Venom Anaphylaxis   Codeine Diarrhea   Hydrocodone-Acetaminophen Diarrhea   Prednisone Diarrhea     OBJECTIVE General Patient is awake, alert, and oriented x 3 and in no acute distress. Derm Skin is dry and supple bilateral. Negative open lesions or macerations. Remaining integument unremarkable. Nails are tender, long, thickened and dystrophic with subungual debris, consistent with onychomycosis, 1-5 bilateral. No signs of infection noted. Vasc  DP and PT pedal pulses palpable bilaterally. Temperature gradient within normal limits.  Neuro Epicritic and protective threshold sensation diminished bilaterally.  Musculoskeletal Exam No symptomatic pedal deformities noted bilateral. Muscular strength within normal limits.  ASSESSMENT 1. Diabetes Mellitus w/ peripheral neuropathy 2.  Pain due to onychomycosis of toenails bilateral  PLAN OF CARE 1. Patient evaluated today.  Comprehensive diabetic evaluation performed today 2. Instructed to maintain good pedal hygiene and foot care. Stressed importance of controlling blood sugar.  3. Mechanical debridement of nails 1-5 bilaterally performed using a  nail nipper. Filed with dremel without incident.  4. Return to clinic in 3 mos.     Felecia Shelling, DPM [facility]  Dr. Felecia Shelling, DPM    [address] [place], [place] [address]                Office [phone]  Fax [phone]

## 2021-11-26 ENCOUNTER — Ambulatory Visit: Payer: BC Managed Care – PPO | Admitting: Podiatry

## 2022-02-07 ENCOUNTER — Encounter: Payer: Self-pay | Admitting: Internal Medicine

## 2022-02-07 ENCOUNTER — Ambulatory Visit (INDEPENDENT_AMBULATORY_CARE_PROVIDER_SITE_OTHER): Payer: BC Managed Care – PPO | Admitting: Internal Medicine

## 2022-02-07 ENCOUNTER — Telehealth: Payer: Self-pay | Admitting: Internal Medicine

## 2022-02-07 VITALS — BP 161/69 | HR 95 | Temp 97.8°F | Resp 16 | Ht 74.0 in | Wt 366.0 lb

## 2022-02-07 DIAGNOSIS — I10 Essential (primary) hypertension: Secondary | ICD-10-CM | POA: Diagnosis not present

## 2022-02-07 DIAGNOSIS — G4733 Obstructive sleep apnea (adult) (pediatric): Secondary | ICD-10-CM | POA: Diagnosis not present

## 2022-02-07 NOTE — Progress Notes (Signed)
[facility] [address] [place], [place] [address]  Pulmonary Sleep Medicine   Office Visit Note  Patient Name: Danny Bauer DOB: Dec 30, 1980 MRN [id number]  Date of Service: 02/07/2022  Complaints/HPI: He was diagnosed with OSA a long time ago. 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 He states he needs an update evaluation. He states he gets his supplies from [facility]. He states he has been getting his supplies up to date. He does states his last PSG was about 3 years ago. He has not had any follow up studies to determine if his pressure is therapeutic. He states he has been feeling tired and feels unrested. States he doesn't have the opportunity to take naps. States he would if he could. States he does not know how much pressure he is one  ROS  General: (-) fever, (-) chills, (-) night sweats, (-) weakness Skin: (-) rashes, (-) itching,. Eyes: (-) visual changes, (-) redness, (-) itching. Nose and Sinuses: (-) nasal stuffiness or itchiness, (-) postnasal drip, (-) nosebleeds, (-) sinus trouble. Mouth and Throat: (-) sore throat, (-) hoarseness. Neck: (-) swollen glands, (-) enlarged thyroid, (-) neck pain. Respiratory: - cough, (-) bloody sputum, - shortness of breath, - wheezing. Cardiovascular: - ankle swelling, (-) chest pain. Lymphatic: (-) lymph node enlargement. Neurologic: (-) numbness, (-) tingling. Psychiatric: (-) anxiety, (-) depression   Current Medication: Outpatient Encounter Medications as of 02/07/2022  Medication Sig   blood glucose meter kit and supplies KIT Dispense based on patient and insurance preference. Use up to four times daily as directed. (FOR ICD-9 250.00, 250.01).   diltiazem (CARDIZEM) 90 MG tablet Take 1 tablet (90 mg total) by mouth daily.   Empagliflozin (JARDIANCE PO) Take by mouth.   EPINEPHrine (EPIPEN 2-PAK) 0.3 mg/0.3 mL IJ  SOAJ injection Inject 0.3 mg into the muscle as needed for anaphylaxis.   EPINEPHrine 0.3 mg/0.3 mL IJ SOAJ injection Inject 0.3 mLs (0.3 mg total) into the muscle as needed for anaphylaxis.   glipiZIDE (GLUCOTROL) 5 MG tablet Take 1 tablet (5 mg total) by mouth 2 (two) times daily before a meal.   glucose blood (ONETOUCH VERIO) test strip 1 each by Other route daily. And lancets 1/day   liraglutide (VICTOZA) 18 MG/3ML SOPN Inject into the skin daily.   lisinopril-hydrochlorothiazide (ZESTORETIC) 20-25 MG tablet Take 1 tablet by mouth daily.   metFORMIN (GLUCOPHAGE) 1000 MG tablet Take 1 tablet (1,000 mg total) by mouth 2 (two) times daily with a meal.   methylPREDNISolone (MEDROL DOSEPAK) 4 MG TBPK tablet Per package dirctions   omeprazole (PRILOSEC) 20 MG capsule Take 1 capsule (20 mg total) by mouth every morning.   No facility-administered encounter medications on file as of 02/07/2022.    Surgical History: Past Surgical History:  Procedure Laterality Date   NOSE SURGERY  2009   straighten nasal passage    TOE AMPUTATION  2001   right great toe    Medical History: Past Medical History:  Diagnosis Date   Diabetes mellitus without complication (HCC)    GERD (gastroesophageal reflux disease)    Hypertension    Sleep apnea, obstructive    sees Dr. Suzanna Obey     Family History: Family History  Problem Relation Age of Onset   Heart failure Mother    Heart disease Mother    Diabetes Mother  Heart attack Father    Heart disease Father     Social History: Social History   Socioeconomic History   Marital status: Legally Separated    Spouse name: Not on file   Number of children: Not on file   Years of education: Not on file   Highest education level: Not on file  Occupational History   Not on file  Tobacco Use   Smoking status: Never   Smokeless tobacco: Never  Vaping Use   Vaping Use: Never used  Substance and Sexual Activity   Alcohol use: No   Drug use: No    Sexual activity: Not on file  Other Topics Concern   Not on file  Social History Narrative   Not on file   Social Determinants of Health   Financial Resource Strain: Medium Risk (06/15/2019)   Overall Financial Resource Strain (CARDIA)    Difficulty of Paying Living Expenses: Somewhat hard  Food Insecurity: No Food Insecurity (06/15/2019)   Hunger Vital Sign    Worried About Running Out of Food in the Last Year: Never true    Ran Out of Food in the Last Year: Never true  Transportation Needs: Unmet Transportation Needs (06/15/2019)   PRAPARE - Transportation    Lack of Transportation (Medical): Yes    Lack of Transportation (Non-Medical): Yes  Physical Activity: Insufficiently Active (06/15/2019)   Exercise Vital Sign    Days of Exercise per Week: 4 days    Minutes of Exercise per Session: 20 min  Stress: Not on file  Social Connections: Socially Isolated (06/15/2019)   Social Connection and Isolation Panel [NHANES]    Frequency of Communication with Friends and Family: Twice a week    Frequency of Social Gatherings with Friends and Family: Twice a week    Attends Religious Services: Never    Marine scientist or Organizations: Patient refused    Attends Archivist Meetings: Never    Marital Status: Separated  Intimate Partner Violence: Not At Risk (06/15/2019)   Humiliation, Afraid, Rape, and Kick questionnaire    Fear of Current or Ex-Partner: No    Emotionally Abused: No    Physically Abused: No    Sexually Abused: No    Vital Signs: Blood pressure (!) 161/69, pulse 95, temperature 97.8 F (36.6 C), resp. rate 16, height 6\' 2"  (1.88 m), weight (!) 366 lb (166 kg), SpO2 95 %.  Examination: General Appearance: The patient is well-developed, well-nourished, and in no distress. Skin: Gross inspection of skin unremarkable. Head: normocephalic, no gross deformities. Eyes: no gross deformities noted. ENT: ears appear grossly normal no exudates. Neck: Supple. No  thyromegaly. No LAD. Respiratory: no rhonchi noted. Cardiovascular: Normal S1 and S2 without murmur or rub. Extremities: No cyanosis. pulses are equal. Neurologic: Alert and oriented. No involuntary movements.  LABS: No results found for this or any previous visit (from the past 2160 hour(s)).  Radiology: No results found.  No results found.  No results found.    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 OSA (obstructive sleep apnea) Patient signs symptoms point towards obstructive sleep apnea we will go ahead and get a PSG done as soon as possible.  Once the study is done we will go over the results and then start patient on PAP therapy if deemed necessary - PSG Sleep Study; Future  2. Obesity, morbid ([place]) Obesity Counseling: Had a lengthy discussion regarding patients BMI and weight issues. Patient was instructed on portion control as well as increased activity. Also discussed caloric restrictions with trying to maintain intake less than 2000 Kcal. Discussions were made in accordance with the 5As of weight management. Simple actions such as not eating late and if able to, taking a walk is suggested.   3. Essential hypertension Blood pressure is elevated if patient has underlying obstructive sleep apnea likely contributing to lack of control I did discuss this with him at length and he understands the connection between the OSA obesity and blood pressure   General Counseling: I have discussed the findings of the evaluation and examination with  Erlene Quan.  I have also discussed any further diagnostic evaluation thatmay be needed or ordered today. Gaither verbalizes understanding of the findings of todays visit. We also reviewed his medications today and discussed drug interactions and side effects including but not limited excessive drowsiness and altered mental states. We also discussed that there is always a risk not just to him but also people around him. he has been encouraged to call the office with any questions or concerns that should arise related to todays visit.  No orders of the defined types were placed in this encounter.    Time spent: 78  I have personally obtained a history, examined the patient, evaluated laboratory and imaging results, formulated the assessment and plan and placed orders.    Allyne Gee, MD [facility] Pulmonary and Critical Care Sleep medicine

## 2022-02-07 NOTE — Telephone Encounter (Signed)
Awaiting 02/07/22 office notes for SS order-Toni

## 2022-02-07 NOTE — Patient Instructions (Signed)

## 2022-02-19 ENCOUNTER — Telehealth: Payer: Self-pay | Admitting: Internal Medicine

## 2022-02-19 NOTE — Telephone Encounter (Signed)
SS order placed in [facility] folder-Toni 

## 2022-03-04 ENCOUNTER — Telehealth: Payer: Self-pay | Admitting: Internal Medicine

## 2022-03-04 NOTE — Telephone Encounter (Signed)
Patient is declining to schedule sleep study due to cost.tat

## 2022-03-18 ENCOUNTER — Ambulatory Visit: Payer: BC Managed Care – PPO | Admitting: Internal Medicine

## 2022-09-14 ENCOUNTER — Other Ambulatory Visit: Payer: Self-pay

## 2022-09-14 ENCOUNTER — Emergency Department
Admission: EM | Admit: 2022-09-14 | Discharge: 2022-09-14 | Disposition: A | Discharge status: Home / Self Care | Payer: BC Managed Care – PPO | Attending: Student in an Organized Health Care Education/Training Program | Admitting: Student in an Organized Health Care Education/Training Program

## 2022-09-14 DIAGNOSIS — T7840XA Allergy, unspecified, initial encounter: Secondary | ICD-10-CM | POA: Diagnosis present

## 2022-09-14 DIAGNOSIS — R0602 Shortness of breath: Secondary | ICD-10-CM | POA: Diagnosis not present

## 2022-09-14 DIAGNOSIS — T782XXA Anaphylactic shock, unspecified, initial encounter: Secondary | ICD-10-CM | POA: Insufficient documentation

## 2022-09-14 DIAGNOSIS — Z91030 Bee allergy status: Secondary | ICD-10-CM

## 2022-09-14 MED ORDER — METHYLPREDNISOLONE SODIUM SUCC 125 MG IJ SOLR
60.0000 mg | Freq: Once | INTRAMUSCULAR | Status: AC
  Administered 2022-09-14: 60 mg via INTRAMUSCULAR
  Filled 2022-09-14: qty 2

## 2022-09-14 MED ORDER — METHYLPREDNISOLONE SODIUM SUCC 125 MG IJ SOLR: 125.0000 mg | Freq: Once | INTRAMUSCULAR | Status: DC

## 2022-09-14 MED ORDER — EPINEPHRINE 0.3 MG/0.3ML IJ SOAJ: 0.3000 mg | INTRAMUSCULAR | 2 refills | Status: DC | PRN

## 2022-09-14 NOTE — ED Triage Notes (Signed)
Pt brought in from home by EMS for an allergic reaction. Pt states he was moving into a new home when he was stung by several bees. Pt states he has a known allergy to bees, so he used his epipen and called EMS. En route to hospital, EMS administered 50mg  PO benadryl. Upon arrival to ED, pt is alert and oriented x 4, in NAD.

## 2022-09-14 NOTE — ED Provider Notes (Signed)
[facility] Provider Note    Event Date/Time   First MD Initiated Contact with Patient 09/14/22 1355     (approximate)   History   Allergic Reaction   HPI  Danny Bauer is a 42 y.o. male with a history of anaphylaxis to bee stings presents to the ER after being stung by 3 wasps roughly 1 hour prior to arrival.  Did give himself an EpiPen.  Was feeling pain as well as feeling flushed and some shortness of breath.  Symptoms have resolved.  He called EMS who also gave him Benadryl.  He walked into the ER in no acute distress.  He is feeling improved right now.     Physical Exam   Triage Vital Signs: ED Triage Vitals [09/14/22 1355]  Encounter Vitals Group     BP 139/64     Systolic BP Percentile      Diastolic BP Percentile      Pulse Rate (!) 103     Resp      Temp 98.5 F (36.9 C)     Temp Source Oral     SpO2 97 %     Weight (!) 351 lb (159.2 kg)     Height 6\' 2"  (1.88 m)     Head Circumference      Peak Flow      Pain Score 0     Pain Loc      Pain Education      Exclude from Growth Chart     Most recent vital signs: Vitals:   09/14/22 1355  BP: 139/64  Pulse: (!) 103  Temp: 98.5 F (36.9 C)  SpO2: 97%     Constitutional: Alert  Eyes: Conjunctivae are normal.  Head: Atraumatic. Nose: No congestion/rhinnorhea. Mouth/Throat: Mucous membranes are moist.  Uvula is midline no edema. Neck: Painless ROM.  Cardiovascular:   Good peripheral circulation. Respiratory: Normal respiratory effort.  No retractions.  No wheezing. Gastrointestinal: Soft and nontender.  Musculoskeletal:  no deformity Neurologic:  MAE spontaneously. No gross focal neurologic deficits are appreciated.  Skin:  Skin is warm, dry and intact.  No urticaria noted. Psychiatric: Mood and affect are normal. Speech and behavior are normal.    ED Results / Procedures / Treatments   Labs (all labs ordered are listed, but only abnormal results are  displayed) Labs Reviewed - No data to display   EKG     RADIOLOGY    PROCEDURES:  Critical Care performed:   Procedures   MEDICATIONS ORDERED IN ED: Medications  methylPREDNISolone sodium succinate (SOLU-MEDROL) 125 mg/2 mL injection 60 mg (has no administration in time range)     IMPRESSION / MDM / ASSESSMENT AND PLAN / ED COURSE  I reviewed the triage vital signs and the nursing notes.                              Differential diagnosis includes, but is not limited to, anaphylaxis, anaphylactoid, urticaria, bee sting  Patient presenting to the ER for evaluation of symptoms as described above.  Based on symptoms, risk factors and considered above differential, this presenting complaint could reflect a potentially life-threatening illness therefore the patient will be placed on continuous pulse oximetry and telemetry for monitoring.  Will give Solu-Medrol.  Will observe in the ER.  He is clinically well-appearing in no acute distress.  Patient be signed out oncoming physician pending reassessment.  Anticipate discharge  home with outpatient follow-up assuming no recurrence of anaphylactic reaction or worsening of symptoms.       FINAL CLINICAL IMPRESSION(S) / ED DIAGNOSES   Final diagnoses:  Anaphylaxis, initial encounter     Rx / DC Orders   ED Discharge Orders          Ordered    EPINEPHrine 0.3 mg/0.3 mL IJ SOAJ injection  As needed        09/14/22 1410             Note:  This document was prepared using Dragon voice recognition software and may include unintentional dictation errors.    Willy Eddy, MD 09/14/22 [phone]

## 2022-09-17 ENCOUNTER — Emergency Department
Admission: EM | Admit: 2022-09-17 | Discharge: 2022-09-17 | Disposition: A | Discharge status: Home / Self Care | Payer: BC Managed Care – PPO | Attending: Emergency Medicine | Admitting: Emergency Medicine

## 2022-09-17 ENCOUNTER — Encounter: Payer: Self-pay | Admitting: Emergency Medicine

## 2022-09-17 ENCOUNTER — Other Ambulatory Visit: Payer: Self-pay

## 2022-09-17 DIAGNOSIS — M25572 Pain in left ankle and joints of left foot: Secondary | ICD-10-CM | POA: Insufficient documentation

## 2022-09-17 MED ORDER — ONDANSETRON 4 MG PO TBDP
4.0000 mg | ORAL_TABLET | Freq: Once | ORAL | Status: AC
  Administered 2022-09-17: 4 mg via ORAL
  Filled 2022-09-17: qty 1

## 2022-09-17 MED ORDER — OXYCODONE-ACETAMINOPHEN 5-325 MG PO TABS
1.0000 | ORAL_TABLET | Freq: Once | ORAL | Status: AC
  Administered 2022-09-17: 1 via ORAL
  Filled 2022-09-17: qty 1

## 2022-09-17 MED ORDER — MELOXICAM 15 MG PO TABS: 15.0000 mg | ORAL_TABLET | Freq: Every day | ORAL | 1 refills | Status: AC

## 2022-09-17 NOTE — ED Provider Notes (Signed)
[facility] Provider Note  Patient Contact: 6:23 PM (approximate)   History   Leg Pain   HPI  Danny Bauer is a 42 y.o. male presents to the emergency department with left posterior ankle and calf pain which started today..  Patient reports that he stepped down off of a curb and felt a sharp pain in this area.  No numbness or tingling in the lower extremity.  No back pain.  No prior history of Achilles tendon rupture in the past.      Physical Exam   Triage Vital Signs: ED Triage Vitals [09/17/22 1803]  Encounter Vitals Group     BP 132/68     Systolic BP Percentile      Diastolic BP Percentile      Pulse Rate 99     Resp 18     Temp 98.6 F (37 C)     Temp Source Oral     SpO2 95 %     Weight (!) 350 lb (158.8 kg)     Height 6\' 2"  (1.88 m)     Head Circumference      Peak Flow      Pain Score 4     Pain Loc      Pain Education      Exclude from Growth Chart     Most recent vital signs: Vitals:   09/17/22 1803  BP: 132/68  Pulse: 99  Resp: 18  Temp: 98.6 F (37 C)  SpO2: 95%     General: Alert and in no acute distress. Eyes:  PERRL. EOMI. Head: No acute traumatic findings ENT:      Nose: No congestion/rhinnorhea.      Mouth/Throat: Mucous membranes are moist. Neck: No stridor. No cervical spine tenderness to palpation. Cardiovascular:  Good peripheral perfusion Respiratory: Normal respiratory effort without tachypnea or retractions. Lungs CTAB. Good air entry to the bases with no decreased or absent breath sounds. Gastrointestinal: Bowel sounds 4 quadrants. Soft and nontender to palpation. No guarding or rigidity. No palpable masses. No distention. No CVA tenderness. Musculoskeletal: Full range of motion to all extremities.  Patient has pain to palpation along the left posterior calf.  Palpable dorsalis pedis pulse, left. Neurologic:  No gross focal neurologic deficits are appreciated.  Skin:   No rash  noted Other:   ED Results / Procedures / Treatments   Labs (all labs ordered are listed, but only abnormal results are displayed) Labs Reviewed - No data to display      PROCEDURES:  Critical Care performed: No  Procedures   MEDICATIONS ORDERED IN ED: Medications  oxyCODONE-acetaminophen (PERCOCET/ROXICET) 5-325 MG per tablet 1 tablet (has no administration in time range)  ondansetron (ZOFRAN-ODT) disintegrating tablet 4 mg (has no administration in time range)     IMPRESSION / MDM / ASSESSMENT AND PLAN / ED COURSE  I reviewed the triage vital signs and the nursing notes.                              Assessment and plan Ankle pain 42 year old male presents to the emergency department with left-sided posterior ankle pain after patient stepped down off of a curb.  No palpable deficit over the insertion for the Achilles tendon but patient does have some mild calf tenderness to palpation.  Will place patient in a cam boot.  Percocet was given for pain and patient was discharged with meloxicam.  FINAL CLINICAL IMPRESSION(S) / ED DIAGNOSES   Final diagnoses:  Acute left ankle pain     Rx / DC Orders   ED Discharge Orders          Ordered    meloxicam (MOBIC) 15 MG tablet  Daily        09/17/22 1821             Note:  This document was prepared using Dragon voice recognition software and may include unintentional dictation errors.   Pia Mau [place], PA-C 09/17/22 Lauretta Chester    Jene Every, MD 09/20/22 1539

## 2022-09-17 NOTE — ED Triage Notes (Signed)
Patient to ED via POV from work for left leg pain. Pt reports stepping off the bus and now has pain radiating from his left heel up to his knee.

## 2022-09-17 NOTE — Discharge Instructions (Addendum)
Take meloxicam once daily for pain and inflammation. 

## 2022-09-20 ENCOUNTER — Ambulatory Visit: Payer: Self-pay | Admitting: Podiatry

## 2022-10-02 ENCOUNTER — Emergency Department: Payer: BC Managed Care – PPO

## 2022-10-02 ENCOUNTER — Emergency Department
Admission: EM | Admit: 2022-10-02 | Discharge: 2022-10-02 | Disposition: A | Discharge status: Home / Self Care | Payer: BC Managed Care – PPO | Attending: Student in an Organized Health Care Education/Training Program | Admitting: Student in an Organized Health Care Education/Training Program

## 2022-10-02 DIAGNOSIS — M79605 Pain in left leg: Secondary | ICD-10-CM | POA: Diagnosis present

## 2022-10-02 DIAGNOSIS — M7989 Other specified soft tissue disorders: Secondary | ICD-10-CM | POA: Diagnosis not present

## 2022-10-02 NOTE — ED Triage Notes (Signed)
Pt referred to ED from [facility] for [place] of his L leg due to swelling and pain. Pt recently had torn achilles on 9/3 and has been having increased swelling since weekend. Denies hx of blood clots, no blood thinners, not SOB.

## 2022-10-02 NOTE — ED Provider Notes (Signed)
[facility] Provider Note    Event Date/Time   First MD Initiated Contact with Patient 10/02/22 1103     (approximate)   History   Leg Swelling   HPI  Danny Bauer is a 42 y.o. male with history of recent partial Achilles tendon tear presents emergency department with increased leg swelling.  Saw his orthopedist and they sent him here for to rule out DVT.  No numbness or tingling.  No fever or chills.      Physical Exam   Triage Vital Signs: ED Triage Vitals  Encounter Vitals Group     BP 10/02/22 1012 133/71     Systolic BP Percentile --      Diastolic BP Percentile --      Pulse Rate 10/02/22 1012 81     Resp 10/02/22 1012 18     Temp 10/02/22 1012 98.4 F (36.9 C)     Temp src --      SpO2 10/02/22 1012 97 %     Weight --      Height --      Head Circumference --      Peak Flow --      Pain Score 10/02/22 1012 0     Pain Loc --      Pain Education --      Exclude from Growth Chart --     Most recent vital signs: Vitals:   10/02/22 1012  BP: 133/71  Pulse: 81  Resp: 18  Temp: 98.4 F (36.9 C)  SpO2: 97%     General: Awake, no distress.   CV:  Good peripheral perfusion. regular rate and  rhythm Resp:  Normal effort.  Abd:  No distention.   Other:  Left posterior leg tender to palpation, neurovascular intact   ED Results / Procedures / Treatments   Labs (all labs ordered are listed, but only abnormal results are displayed) Labs Reviewed - No data to display   EKG     RADIOLOGY  Ultrasound left lower extremity   PROCEDURES:   Procedures   MEDICATIONS ORDERED IN ED: Medications - No data to display   IMPRESSION / MDM / ASSESSMENT AND PLAN / ED COURSE  I reviewed the triage vital signs and the nursing notes.                              Differential diagnosis includes, but is not limited to, DVT, Achilles tendon tear, cellulitis  Patient's presentation is most consistent with acute presentation  with potential threat to life or bodily function.   Ultrasound, independently reviewed interpreted by me by reading radiologist report as being negative for any acute abnormality  Patient's exam is consistent with concerns for DVT therefore the ultrasound was ordered from triage and is negative for any acute abnormality.  However did caution the patient that being immobilized can increase his risk of getting a DVT and if he is not improved in 7 days he will need a repeat ultrasound.  Patient is in agreement treatment plan.  Discharged stable condition.      FINAL CLINICAL IMPRESSION(S) / ED DIAGNOSES   Final diagnoses:  Left leg pain     Rx / DC Orders   ED Discharge Orders     None        Note:  This document was prepared using Dragon voice recognition software and may include unintentional dictation errors.  Faythe Ghee, PA-C 10/02/22 1305    Willy Eddy, MD 10/02/22 [phone]

## 2022-12-03 ENCOUNTER — Ambulatory Visit (INDEPENDENT_AMBULATORY_CARE_PROVIDER_SITE_OTHER): Payer: BC Managed Care – PPO | Admitting: Podiatry

## 2022-12-03 DIAGNOSIS — B351 Tinea unguium: Secondary | ICD-10-CM | POA: Diagnosis not present

## 2022-12-03 DIAGNOSIS — E119 Type 2 diabetes mellitus without complications: Secondary | ICD-10-CM

## 2022-12-03 DIAGNOSIS — M79674 Pain in right toe(s): Secondary | ICD-10-CM | POA: Diagnosis not present

## 2022-12-03 DIAGNOSIS — M79675 Pain in left toe(s): Secondary | ICD-10-CM

## 2022-12-03 NOTE — Progress Notes (Signed)
   No chief complaint on file.   SUBJECTIVE Patient with a history of diabetes mellitus presents to office today complaining of elongated, thickened nails that cause pain while ambulating in shoes.  Patient is unable to trim their own nails. Patient is here for further evaluation and treatment.  Past Medical History:  Diagnosis Date   Diabetes mellitus without complication (HCC)    GERD (gastroesophageal reflux disease)    Hypertension    Sleep apnea, obstructive    sees Dr. Suzanna Obey     Allergies  Allergen Reactions   Bee Venom Anaphylaxis   Codeine Diarrhea   Hydrocodone-Acetaminophen Diarrhea   Prednisone Diarrhea     OBJECTIVE General Patient is awake, alert, and oriented x 3 and in no acute distress. Derm Skin is dry and supple bilateral. Negative open lesions or macerations. Remaining integument unremarkable. Nails are tender, long, thickened and dystrophic with subungual debris, consistent with onychomycosis, 1-5 bilateral. No signs of infection noted. Vasc  DP and PT pedal pulses palpable bilaterally. Temperature gradient within normal limits.  Neuro Epicritic and protective threshold sensation diminished bilaterally.  Musculoskeletal Exam No symptomatic pedal deformities noted bilateral. Muscular strength within normal limits.  ASSESSMENT 1. Diabetes Mellitus w/ peripheral neuropathy 2.  Pain due to onychomycosis of toenails bilateral 3.  Encounter for diabetic foot exam  PLAN OF CARE 1. Patient evaluated today.  Comprehensive diabetic foot exam performed today 2. Instructed to maintain good pedal hygiene and foot care. Stressed importance of controlling blood sugar.  3. Mechanical debridement of nails 1-5 bilaterally performed using a nail nipper. Filed with dremel without incident.  4. Return to clinic in 3 mos.     Felecia Shelling, DPM [facility]  Dr. Felecia Shelling, DPM    [address] [place], [place] [address]                Office [phone]  Fax [phone]

## 2022-12-18 ENCOUNTER — Other Ambulatory Visit: Payer: Self-pay

## 2022-12-18 ENCOUNTER — Encounter: Payer: Self-pay | Admitting: Intensive Care

## 2022-12-18 ENCOUNTER — Emergency Department
Admission: EM | Admit: 2022-12-18 | Discharge: 2022-12-18 | Disposition: A | Discharge status: Home / Self Care | Payer: BC Managed Care – PPO | Attending: Student in an Organized Health Care Education/Training Program | Admitting: Student in an Organized Health Care Education/Training Program

## 2022-12-18 DIAGNOSIS — I10 Essential (primary) hypertension: Secondary | ICD-10-CM | POA: Insufficient documentation

## 2022-12-18 DIAGNOSIS — L03116 Cellulitis of left lower limb: Secondary | ICD-10-CM | POA: Insufficient documentation

## 2022-12-18 DIAGNOSIS — S46211A Strain of muscle, fascia and tendon of other parts of biceps, right arm, initial encounter: Secondary | ICD-10-CM | POA: Insufficient documentation

## 2022-12-18 DIAGNOSIS — L03115 Cellulitis of right lower limb: Secondary | ICD-10-CM | POA: Insufficient documentation

## 2022-12-18 DIAGNOSIS — W19XXXA Unspecified fall, initial encounter: Secondary | ICD-10-CM | POA: Diagnosis not present

## 2022-12-18 DIAGNOSIS — L03119 Cellulitis of unspecified part of limb: Secondary | ICD-10-CM

## 2022-12-18 DIAGNOSIS — E119 Type 2 diabetes mellitus without complications: Secondary | ICD-10-CM | POA: Diagnosis not present

## 2022-12-18 DIAGNOSIS — S59911A Unspecified injury of right forearm, initial encounter: Secondary | ICD-10-CM | POA: Diagnosis present

## 2022-12-18 LAB — COMPREHENSIVE METABOLIC PANEL
ALT: 28 U/L (ref 0–44)
AST: 40 U/L (ref 15–41)
Albumin: 3.2 g/dL — ABNORMAL LOW (ref 3.5–5.0)
Alkaline Phosphatase: 62 U/L (ref 38–126)
Anion gap: 8 (ref 5–15)
BUN: 12 mg/dL (ref 6–20)
CO2: 23 mmol/L (ref 22–32)
Calcium: 8.8 mg/dL — ABNORMAL LOW (ref 8.9–10.3)
Chloride: 104 mmol/L (ref 98–111)
Creatinine, Ser: 0.69 mg/dL (ref 0.61–1.24)
GFR, Estimated: >60 mL/min (ref >60)
Glucose, Bld: 114 mg/dL — ABNORMAL HIGH (ref 70–99)
Potassium: 3.9 mmol/L (ref 3.5–5.1)
Sodium: 135 mmol/L (ref 135–145)
Total Bilirubin: 1.3 mg/dL — ABNORMAL HIGH (ref <1.2)
Total Protein: 6.9 g/dL (ref 6.5–8.1)

## 2022-12-18 LAB — CBC WITH DIFFERENTIAL/PLATELET
Abs Immature Granulocytes: 0.03 10*3/uL (ref 0.00–0.07)
Basophils Absolute: 0 10*3/uL (ref 0.0–0.1)
Basophils Relative: 1 %
Eosinophils Absolute: 0 10*3/uL (ref 0.0–0.5)
Eosinophils Relative: 1 %
HCT: 33.4 % — ABNORMAL LOW (ref 39.0–52.0)
Hemoglobin: 11.1 g/dL — ABNORMAL LOW (ref 13.0–17.0)
Immature Granulocytes: 1 %
Lymphocytes Relative: 29 %
Lymphs Abs: 1.4 10*3/uL (ref 0.7–4.0)
MCH: 29 pg (ref 26.0–34.0)
MCHC: 33.2 g/dL (ref 30.0–36.0)
MCV: 87.2 fL (ref 80.0–100.0)
Monocytes Absolute: 0.3 10*3/uL (ref 0.1–1.0)
Monocytes Relative: 7 %
Neutro Abs: 3 10*3/uL (ref 1.7–7.7)
Neutrophils Relative %: 61 %
Platelets: 158 10*3/uL (ref 150–400)
RBC: 3.83 MIL/uL — ABNORMAL LOW (ref 4.22–5.81)
RDW: 20.9 % — ABNORMAL HIGH (ref 11.5–15.5)
WBC: 4.8 10*3/uL (ref 4.0–10.5)
nRBC: 0 % (ref 0.0–0.2)

## 2022-12-18 MED ORDER — CEPHALEXIN 500 MG PO CAPS
500.0000 mg | ORAL_CAPSULE | Freq: Once | ORAL | Status: AC
  Administered 2022-12-18: 500 mg via ORAL
  Filled 2022-12-18: qty 1

## 2022-12-18 MED ORDER — CYCLOBENZAPRINE HCL 5 MG PO TABS: 5.0000 mg | ORAL_TABLET | Freq: Three times a day (TID) | ORAL | 0 refills | Status: DC | PRN

## 2022-12-18 MED ORDER — CEPHALEXIN 500 MG PO CAPS: 500.0000 mg | ORAL_CAPSULE | Freq: Four times a day (QID) | ORAL | 0 refills | Status: AC

## 2022-12-18 MED ORDER — CEPHALEXIN 500 MG PO CAPS: 500.0000 mg | ORAL_CAPSULE | Freq: Three times a day (TID) | ORAL | 0 refills | Status: DC

## 2022-12-18 NOTE — Discharge Instructions (Signed)
You are being treated for a cellulitis to the lower extremities with an antibiotic.  Take all pills as directed until they are complete.  You also have evidence of a muscle strain or bicep tendinitis on the right arm.  Take OTC ibuprofen as directed and the muscle relaxant as needed.  Follow-up with [facility] for ongoing evaluation management.

## 2022-12-18 NOTE — ED Triage Notes (Signed)
Patient c/o bilateral leg cellulitis flare up that started Friday. C/o itching, minimal swelling and warm to touch.  Also is having upper, right arm pain from a fall last week.

## 2022-12-18 NOTE — ED Provider Notes (Signed)
[facility] Emergency Department Provider Note     Event Date/Time   First MD Initiated Contact with Patient 12/18/22 1431     (approximate)   History   Cellulitis and Arm Pain (right)   HPI  Danny Bauer is a 42 y.o. male with a history of obesity, HTN, GERD, OSA, and diabetes, presents to the ED for evaluation of inflammation to the legs bilaterally.  Patient described symptoms as a flareup as started on Friday.  He does endorse itching and irritation to the skin.  No fevers, chills, or sweats reported.  Patient has unrelated complaint of right arm pain secondary to mechanical fall last week.  UE describes falling out of her barber chair at a local community college where he went to have his daughter cut his hair.  As he pushed himself off the floor, he felt pain to the anterior aspect of his right arm at the bicep.  He denies any head injury or LOC.  Physical Exam   Triage Vital Signs: ED Triage Vitals  Encounter Vitals Group     BP 12/18/22 1127 117/62     Systolic BP Percentile --      Diastolic BP Percentile --      Pulse Rate 12/18/22 1124 (!) 105     Resp 12/18/22 1124 16     Temp 12/18/22 1124 98.3 F (36.8 C)     Temp Source 12/18/22 1124 Oral     SpO2 12/18/22 1124 100 %     Weight 12/18/22 1124 (!) 345 lb (156.5 kg)     Height 12/18/22 1124 6\' 2"  (1.88 m)     Head Circumference --      Peak Flow --      Pain Score 12/18/22 1124 2     Pain Loc --      Pain Education --      Exclude from Growth Chart --     Most recent vital signs: Vitals:   12/18/22 1127 12/18/22 1538  BP: 117/62   Pulse:    Resp:    Temp:  98.1 F (36.7 C)  SpO2:      General Awake, no distress. NAD HEENT NCAT. PERRL. EOMI. No rhinorrhea. Mucous membranes are moist.  CV:  Good peripheral perfusion. RRR.  2+ pitting edema bilateral lower extremities RESP:  Normal effort. CTA ABD:  No distention.  MSK:  Bilateral lower extremities with anterior areas  noted with some erythema and some mild localized warmth.   ED Results / Procedures / Treatments   Labs (all labs ordered are listed, but only abnormal results are displayed) Labs Reviewed  CBC WITH DIFFERENTIAL/PLATELET - Abnormal; Notable for the following components:      Result Value   RBC 3.83 (*)    Hemoglobin 11.1 (*)    HCT 33.4 (*)    RDW 20.9 (*)    All other components within normal limits  COMPREHENSIVE METABOLIC PANEL - Abnormal; Notable for the following components:   Glucose, Bld 114 (*)    Calcium 8.8 (*)    Albumin 3.2 (*)    Total Bilirubin 1.3 (*)    All other components within normal limits    EKG    RADIOLOGY   PROCEDURES:  Critical Care performed: No  Procedures   MEDICATIONS ORDERED IN ED: Medications  cephALEXin (KEFLEX) capsule 500 mg (500 mg Oral Given 12/18/22 1538)     IMPRESSION / MDM / ASSESSMENT AND PLAN / ED  COURSE  I reviewed the triage vital signs and the nursing notes.                              Differential diagnosis includes, but is not limited to, low extremity edema, cellulitis, contact dermatitis, abscess, abrasion  Patient's presentation is most consistent with acute complicated illness / injury requiring diagnostic workup.  Patient's diagnosis is consistent with nonpurulent cellulitis.  Patient also with evidence of a right bicep muscle strain.  Patient's labs are reassuring at this time.  Clinically the patient presents with what appears to be a early nonpurulent cellulitis to the right lateral lower extremities.  Patient will be discharged home with prescriptions for Keflex, cyclobenzaprine. Patient is to follow up with PCP as discussed as needed or otherwise directed. Patient is given ED precautions to return to the ED for any worsening or new symptoms    FINAL CLINICAL IMPRESSION(S) / ED DIAGNOSES   Final diagnoses:  Cellulitis of lower extremity, unspecified laterality  Biceps muscle strain, right, initial  encounter     Rx / DC Orders   ED Discharge Orders          Ordered    cephALEXin (KEFLEX) 500 MG capsule  3 times daily,   Status:  Discontinued        12/18/22 1536    cyclobenzaprine (FLEXERIL) 5 MG tablet  3 times daily PRN        12/18/22 1536    cephALEXin (KEFLEX) 500 MG capsule  4 times daily        12/18/22 1536             Note:  This document was prepared using Dragon voice recognition software and may include unintentional dictation errors.    Lissa Hoard, PA-C 12/21/22 2330    Willy Eddy, MD 12/28/22 [phone]

## 2022-12-18 NOTE — ED Notes (Signed)
See triage notes. Patient c/o bilateral leg cellulitis flare up that started Friday as well as upper right arm pain secondary to a fall last week.

## 2023-03-07 ENCOUNTER — Ambulatory Visit: Payer: BC Managed Care – PPO | Admitting: Podiatry

## 2023-03-07 DIAGNOSIS — Z91198 Patient's noncompliance with other medical treatment and regimen for other reason: Secondary | ICD-10-CM

## 2023-03-07 NOTE — Progress Notes (Signed)
1. Failure to attend appointment with reason given   Patient called to reschedule today's appointment. Scheduling conflict.

## 2023-03-31 ENCOUNTER — Ambulatory Visit (INDEPENDENT_AMBULATORY_CARE_PROVIDER_SITE_OTHER): Payer: BC Managed Care – PPO | Admitting: Podiatry

## 2023-03-31 DIAGNOSIS — Z91199 Patient's noncompliance with other medical treatment and regimen due to unspecified reason: Secondary | ICD-10-CM

## 2023-03-31 NOTE — Progress Notes (Signed)
 1. No-show for appointment

## 2023-04-16 ENCOUNTER — Ambulatory Visit: Payer: BC Managed Care – PPO | Attending: Cardiology | Admitting: Cardiology

## 2023-04-16 ENCOUNTER — Encounter: Payer: Self-pay | Admitting: Cardiology

## 2023-04-16 VITALS — BP 130/77 | HR 78 | Ht 74.0 in | Wt 349.4 lb

## 2023-04-16 DIAGNOSIS — I10 Essential (primary) hypertension: Secondary | ICD-10-CM | POA: Diagnosis not present

## 2023-04-16 DIAGNOSIS — R011 Cardiac murmur, unspecified: Secondary | ICD-10-CM

## 2023-04-16 DIAGNOSIS — R600 Localized edema: Secondary | ICD-10-CM | POA: Diagnosis not present

## 2023-04-16 MED ORDER — LOSARTAN POTASSIUM 100 MG PO TABS: 100.0000 mg | ORAL_TABLET | Freq: Every day | ORAL | 3 refills | Status: AC

## 2023-04-16 MED ORDER — FUROSEMIDE 20 MG PO TABS: 40.0000 mg | ORAL_TABLET | Freq: Every day | ORAL | 3 refills | Status: DC

## 2023-04-16 NOTE — Progress Notes (Signed)
 Cardiology Office Note:    Date:  04/16/2023   ID:  Danny Bauer, DOB [birth year], MRN [id number]  PCP:  [facility] Providers Cardiologist:  None     Referring MD: Jethro Bastos, NP   Chief Complaint  Patient presents with   Follow-up    Leg edema off and on   Danny Bauer is a 43 y.o. male who is being seen today for the evaluation of leg edema at the request of Jethro Bastos, NP.   History of Present Illness:    Danny Bauer is a 43 y.o. male with a hx of hypertension, diabetes, obesity, OSA who presents with leg edema.  Complains of lower extremity swelling over the past 3 to 4 months.  Denies chest pain or shortness of breath.  Compliant with CPAP mask for sleep apnea.  Was prescribed Lasix with good effect.  Ran out of Lasix.  Father had a heart attack in his 63s, father has a history of congestive heart failure.  Left lower extremity ultrasound 09/2022 showed no DVT.  Past Medical History:  Diagnosis Date   Diabetes mellitus without complication (HCC)    GERD (gastroesophageal reflux disease)    Hypertension    Sleep apnea, obstructive    sees Dr. Suzanna Obey     Past Surgical History:  Procedure Laterality Date   NOSE SURGERY  2009   straighten nasal passage    TOE AMPUTATION  2001   right great toe    Current Medications: Current Meds  Medication Sig   blood glucose meter kit and supplies KIT Dispense based on patient and insurance preference. Use up to four times daily as directed. (FOR ICD-9 250.00, 250.01).   cyclobenzaprine (FLEXERIL) 5 MG tablet Take 1 tablet (5 mg total) by mouth 3 (three) times daily as needed.   doxycycline (DORYX) 100 MG EC tablet    Empagliflozin (JARDIANCE PO) Take by mouth.   EPINEPHrine (EPIPEN 2-PAK) 0.3 mg/0.3 mL IJ SOAJ injection Inject 0.3 mg into the muscle as needed for anaphylaxis.   EPINEPHrine 0.3 mg/0.3 mL IJ SOAJ injection Inject 0.3 mg into the muscle as  needed for anaphylaxis.   furosemide (LASIX) 20 MG tablet Take 2 tablets (40 mg total) by mouth daily.   glipiZIDE (GLUCOTROL) 5 MG tablet Take 1 tablet (5 mg total) by mouth 2 (two) times daily before a meal.   glucose blood (ONETOUCH VERIO) test strip 1 each by Other route daily. And lancets 1/day   liraglutide (VICTOZA) 18 MG/3ML SOPN Inject into the skin daily.   losartan (COZAAR) 100 MG tablet Take 1 tablet (100 mg total) by mouth daily.   metFORMIN (GLUCOPHAGE) 1000 MG tablet Take 1 tablet (1,000 mg total) by mouth 2 (two) times daily with a meal.   omeprazole (PRILOSEC) 20 MG capsule Take 1 capsule (20 mg total) by mouth every morning.   [DISCONTINUED] diltiazem (CARDIZEM) 90 MG tablet Take 1 tablet (90 mg total) by mouth daily.   [DISCONTINUED] lisinopril-hydrochlorothiazide (ZESTORETIC) 20-25 MG tablet Take 1 tablet by mouth daily.     Allergies:   Bee venom, Codeine, Hydrocodone-acetaminophen, and Prednisone   Social History   Socioeconomic History   Marital status: Legally Separated    Spouse name: Not on file   Number of children: Not on file   Years of education: Not on file   Highest education level: Not on file  Occupational History   Not on file  Tobacco Use   Smoking status: Never   Smokeless tobacco: Never  Vaping Use   Vaping status: Never Used  Substance and Sexual Activity   Alcohol use: No   Drug use: No   Sexual activity: Not on file  Other Topics Concern   Not on file  Social History Narrative   Not on file   Social Drivers of Health   Financial Resource Strain: Medium Risk (06/15/2019)   Overall Financial Resource Strain (CARDIA)    Difficulty of Paying Living Expenses: Somewhat hard  Food Insecurity: No Food Insecurity (06/15/2019)   Hunger Vital Sign    Worried About Running Out of Food in the Last Year: Never true    Ran Out of Food in the Last Year: Never true  Transportation Needs: Unmet Transportation Needs (06/15/2019)   PRAPARE -  Transportation    Lack of Transportation (Medical): Yes    Lack of Transportation (Non-Medical): Yes  Physical Activity: Insufficiently Active (06/15/2019)   Exercise Vital Sign    Days of Exercise per Week: 4 days    Minutes of Exercise per Session: 20 min  Stress: Not on file  Social Connections: Socially Isolated (06/15/2019)   Social Connection and Isolation Panel [NHANES]    Frequency of Communication with Friends and Family: Twice a week    Frequency of Social Gatherings with Friends and Family: Twice a week    Attends Religious Services: Never    Database administrator or Organizations: Patient declined    Attends Banker Meetings: Never    Marital Status: Separated     Family History: The patient's family history includes Diabetes in his mother; Heart attack in his father; Heart disease in his father and mother; Heart failure in his mother.  ROS:   Please see the history of present illness.     All other systems reviewed and are negative.  EKGs/Labs/Other Studies Reviewed:    The following studies were reviewed today:  EKG Interpretation Date/Time:  Wednesday April 16 2023 09:47:43 EDT Ventricular Rate:  78 PR Interval:  156 QRS Duration:  86 QT Interval:  412 QTC Calculation: 469 R Axis:   1  Text Interpretation: Normal sinus rhythm Normal ECG Confirmed by Debbe Odea ([id number]) on 04/16/2023 9:49:39 AM    Recent Labs: 12/18/2022: ALT 28; BUN 12; Creatinine, Ser 0.69; Hemoglobin 11.1; Platelets 158; Potassium 3.9; Sodium 135  Recent Lipid Panel    Component Value Date/Time   CHOL 135 03/03/2019 1013   TRIG 121 03/03/2019 1013   HDL 36 (L) 03/03/2019 1013   CHOLHDL 3.8 03/03/2019 1013   CHOLHDL 3 02/13/2018 0943   VLDL 16.2 02/13/2018 0943   LDLCALC 77 03/03/2019 1013   LDLDIRECT 80.0 01/10/2015 0859     Risk Assessment/Calculations:             Physical Exam:    VS:  BP 130/77 (BP Location: Left Arm, Patient Position: Sitting, Cuff  Size: Normal)   Pulse 78   Ht 6\' 2"  (1.88 m)   Wt (!) 349 lb 6.4 oz (158.5 kg)   SpO2 98%   BMI 44.86 kg/m     Wt Readings from Last 3 Encounters:  04/16/23 (!) 349 lb 6.4 oz (158.5 kg)  12/18/22 (!) 345 lb (156.5 kg)  09/17/22 (!) 350 lb (158.8 kg)     GEN:  Well nourished, well developed in no acute distress HEENT: Normal NECK: No JVD; No carotid bruits CARDIAC: RRR, 2/6 systolic murmur loudest at  apex RESPIRATORY:  Clear to auscultation without rales, wheezing or rhonchi  ABDOMEN: Soft, non-tender, non-distended MUSCULOSKELETAL:  1-2+ edema; No deformity  SKIN: Warm and dry NEUROLOGIC:  Alert and oriented x 3 PSYCHIATRIC:  Normal affect   ASSESSMENT:    1. Lower leg edema   2. Primary hypertension   3. MORBID OBESITY   4. Systolic murmur    PLAN:    In order of problems listed above:  Bilateral edema, denies chest pain or shortness of breath.  Obtain echocardiogram, start Lasix 40 mg daily, check BMP in 10 days.  Stop Cardizem. Hypertension, stop Cardizem, stop lisinopril-HCTZ.  Start losartan 100 mg daily, start Lasix 40 mg daily as above. Morbid obesity, low-calorie diet, weight loss advised.  Obesity could also be contributing to leg edema. Systolic murmur, echocardiogram as above  Follow-up after echocardiogram      Medication Adjustments/Labs and Tests Ordered: Current medicines are reviewed at length with the patient today.  Concerns regarding medicines are outlined above.  Orders Placed This Encounter  Procedures   Basic metabolic panel with GFR   EKG [id number]   ECHOCARDIOGRAM COMPLETE   Meds ordered this encounter  Medications   losartan (COZAAR) 100 MG tablet    Sig: Take 1 tablet (100 mg total) by mouth daily.    Dispense:  90 tablet    Refill:  3   furosemide (LASIX) 20 MG tablet    Sig: Take 2 tablets (40 mg total) by mouth daily.    Dispense:  90 tablet    Refill:  3    Patient Instructions  Medication Instructions:  Your physician  recommends the following medication changes.  STOP TAKING: Stop Diltiazem  Stop Zestoretic  START TAKING: Losartan 100 Mg daily Lasix 40 Mg daily    *If you need a refill on your cardiac medications before your next appointment, please call your pharmacy*  Lab Work: Your provider would like for you to return in 10 days to have the following labs drawn: BMP.   Please go to [facility] [address] ([facility]) [address], [place] [address] You do not need an appointment.  They are open from 8 am- 4:30 pm.  Lunch from 1:00 pm- 2:00 pm You will not need to be fasting.  If you have labs (blood work) drawn today and your tests are completely normal, you will receive your results only by: MyChart Message (if you have MyChart) OR A paper copy in the mail If you have any lab test that is abnormal or we need to change your treatment, we will call you to review the results.  Testing/Procedures: Your physician has requested that you have an echocardiogram. Echocardiography is a painless test that uses sound waves to create images of your heart. It provides your doctor with information about the size and shape of your heart and how well your heart's chambers and valves are working.   You may receive an ultrasound enhancing agent through an IV if needed to better visualize your heart during the echo. This procedure takes approximately one hour.  There are no restrictions for this procedure.  This will take place at [address] [facility]) [address], [place] [address]  Please note: We ask at that you not bring children with you during ultrasound (echo/ vascular) testing. Due to room size and safety concerns, children are not allowed in the ultrasound rooms during exams. Our front office staff cannot provide observation of children in our lobby area  while testing is being conducted. An adult accompanying a patient to their appointment will only be allowed in  the ultrasound room at the discretion of the ultrasound technician under special circumstances. We apologize for any inconvenience.   Follow-Up: At [facility], you and your health needs are our priority.  As part of our continuing mission to provide you with exceptional heart care, our providers are all part of one team.  This team includes your primary Cardiologist (physician) and Advanced Practice Providers or APPs (Physician Assistants and Nurse Practitioners) who all work together to provide you with the care you need, when you need it.  Your next appointment:   2 month(s)  Provider:   You may see Dr. Azucena Cecil or one of the following Advanced Practice Providers on your designated Care Team:   Nicolasa Ducking, NP Ames Dura, PA-C Eula Listen, PA-C Cadence [place], PA-C Charlsie Quest, NP Carlos Levering, NP    We recommend signing up for the patient portal called "MyChart".  Sign up information is provided on this After Visit Summary.  MyChart is used to connect with patients for Virtual Visits (Telemedicine).  Patients are able to view lab/test results, encounter notes, upcoming appointments, etc.  Non-urgent messages can be sent to your provider as well.   To learn more about what you can do with MyChart, go to [email or web address].            Signed, Debbe Odea, MD  04/16/2023 10:32 AM    [facility]

## 2023-04-16 NOTE — Patient Instructions (Signed)
 Medication Instructions:  Your physician recommends the following medication changes.  STOP TAKING: Stop Diltiazem  Stop Zestoretic  START TAKING: Losartan 100 Mg daily Lasix 40 Mg daily    *If you need a refill on your cardiac medications before your next appointment, please call your pharmacy*  Lab Work: Your provider would like for you to return in 10 days to have the following labs drawn: BMP.   Please go to [facility] [address] ([facility]) [address], [place] [address] You do not need an appointment.  They are open from 8 am- 4:30 pm.  Lunch from 1:00 pm- 2:00 pm You will not need to be fasting.  If you have labs (blood work) drawn today and your tests are completely normal, you will receive your results only by: MyChart Message (if you have MyChart) OR A paper copy in the mail If you have any lab test that is abnormal or we need to change your treatment, we will call you to review the results.  Testing/Procedures: Your physician has requested that you have an echocardiogram. Echocardiography is a painless test that uses sound waves to create images of your heart. It provides your doctor with information about the size and shape of your heart and how well your heart's chambers and valves are working.   You may receive an ultrasound enhancing agent through an IV if needed to better visualize your heart during the echo. This procedure takes approximately one hour.  There are no restrictions for this procedure.  This will take place at [address] [facility]) [address], [place] [address]  Please note: We ask at that you not bring children with you during ultrasound (echo/ vascular) testing. Due to room size and safety concerns, children are not allowed in the ultrasound rooms during exams. Our front office staff cannot provide observation of children in our lobby area while testing is being conducted. An adult accompanying a patient to  their appointment will only be allowed in the ultrasound room at the discretion of the ultrasound technician under special circumstances. We apologize for any inconvenience.   Follow-Up: At [facility], you and your health needs are our priority.  As part of our continuing mission to provide you with exceptional heart care, our providers are all part of one team.  This team includes your primary Cardiologist (physician) and Advanced Practice Providers or APPs (Physician Assistants and Nurse Practitioners) who all work together to provide you with the care you need, when you need it.  Your next appointment:   2 month(s)  Provider:   You may see Dr. Azucena Cecil or one of the following Advanced Practice Providers on your designated Care Team:   Nicolasa Ducking, NP Ames Dura, PA-C Eula Listen, PA-C Cadence [place], PA-C Charlsie Quest, NP Carlos Levering, NP    We recommend signing up for the patient portal called "MyChart".  Sign up information is provided on this After Visit Summary.  MyChart is used to connect with patients for Virtual Visits (Telemedicine).  Patients are able to view lab/test results, encounter notes, upcoming appointments, etc.  Non-urgent messages can be sent to your provider as well.   To learn more about what you can do with MyChart, go to [email or web address].

## 2023-04-24 ENCOUNTER — Ambulatory Visit (INDEPENDENT_AMBULATORY_CARE_PROVIDER_SITE_OTHER): Admitting: Physician Assistant

## 2023-04-24 VITALS — BP 145/74 | HR 97 | Ht 74.0 in | Wt 349.0 lb

## 2023-04-24 DIAGNOSIS — N529 Male erectile dysfunction, unspecified: Secondary | ICD-10-CM | POA: Diagnosis not present

## 2023-04-24 MED ORDER — TADALAFIL 20 MG PO TABS: ORAL_TABLET | ORAL | 5 refills | Status: AC

## 2023-04-24 NOTE — Progress Notes (Signed)
 04/24/2023 10:00 AM   Danny Bauer 05-10-1980 [id number]  CC: Chief Complaint  Patient presents with   Erectile Dysfunction   HPI: Danny Bauer is a 43 y.o. male with PMH DM2, HTN, OSA on CPAP, and balanoposthitis who presents today for evaluation of ED.   Today he reports a couple of months of erectile dysfunction with difficulty achieving and maintaining erections.  He denies penile curvature or painful erections.  He denies any chest pain and is not on any nitrates.  He is seeing cardiology for evaluation of BLE edema and is on Lasix.  He denies obstructive voiding symptoms including weak stream, hesitancy, straining, or intermittency.  He is s/p vasectomy in 2009.  SHIM 18 as below.   SHIM     Row Name 04/24/23 0955         SHIM: Over the last 6 months:   How do you rate your confidence that you could get and keep an erection? Moderate     When you had erections with sexual stimulation, how often were your erections hard enough for penetration (entering your partner)? Sometimes (about half the time)     During sexual intercourse, how often were you able to maintain your erection after you had penetrated (entered) your partner? Sometimes (about half the time)     During sexual intercourse, how difficult was it to maintain your erection to completion of intercourse? Slightly Difficult     When you attempted sexual intercourse, how often was it satisfactory for you? Almost Always or Always       SHIM Total Score   SHIM 18               PMH: Past Medical History:  Diagnosis Date   Diabetes mellitus without complication (HCC)    GERD (gastroesophageal reflux disease)    Hypertension    Sleep apnea, obstructive    sees Dr. Suzanna Obey     Surgical History: Past Surgical History:  Procedure Laterality Date   NOSE SURGERY  2009   straighten nasal passage    TOE AMPUTATION  2001   right great toe    Home Medications:  Allergies as of 04/24/2023        Reactions   Bee Venom Anaphylaxis   Has epi pen   Dihydrocodeine Diarrhea   Hydrocodone-acetaminophen Diarrhea   Codeine Diarrhea   Hydrocodone-acetaminophen Diarrhea   Prednisone Diarrhea        Medication List        Accurate as of April 24, 2023 10:00 AM. If you have any questions, ask your nurse or doctor.          STOP taking these medications    doxycycline 100 MG EC tablet Commonly known as: DORYX   liraglutide 18 MG/3ML Sopn Commonly known as: VICTOZA       TAKE these medications    blood glucose meter kit and supplies Kit Dispense based on patient and insurance preference. Use up to four times daily as directed. (FOR ICD-9 250.00, 250.01).   cetirizine 10 MG tablet Commonly known as: ZYRTEC Take 10 mg by mouth daily.   cyclobenzaprine 5 MG tablet Commonly known as: FLEXERIL Take 1 tablet (5 mg total) by mouth 3 (three) times daily as needed.   EPINEPHrine 0.3 mg/0.3 mL Soaj injection Commonly known as: EpiPen 2-Pak Inject 0.3 mg into the muscle as needed for anaphylaxis.   EPINEPHrine 0.3 mg/0.3 mL Soaj injection Commonly known as: EPI-PEN Inject 0.3 mg  into the muscle as needed for anaphylaxis.   fluticasone 50 MCG/ACT nasal spray Commonly known as: FLONASE Place into both nostrils.   furosemide 20 MG tablet Commonly known as: LASIX Take 2 tablets (40 mg total) by mouth daily.   glipiZIDE 10 MG tablet Commonly known as: GLUCOTROL Take 10 mg by mouth 2 (two) times daily. What changed: Another medication with the same name was removed. Continue taking this medication, and follow the directions you see here.   glucose blood test strip Commonly known as: OneTouch Verio 1 each by Other route daily. And lancets 1/day   Januvia 50 MG tablet Generic drug: sitaGLIPtin Take 50 mg by mouth daily.   Jardiance 25 MG Tabs tablet Generic drug: empagliflozin Take 25 mg by mouth daily. What changed: Another medication with the same name was  removed. Continue taking this medication, and follow the directions you see here.   losartan 100 MG tablet Commonly known as: COZAAR Take 1 tablet (100 mg total) by mouth daily.   meloxicam 15 MG tablet Commonly known as: MOBIC Take 15 mg by mouth daily.   metFORMIN 1000 MG tablet Commonly known as: GLUCOPHAGE Take 1 tablet (1,000 mg total) by mouth 2 (two) times daily with a meal.   omeprazole 20 MG capsule Commonly known as: PRILOSEC Take 1 capsule (20 mg total) by mouth every morning.   orphenadrine 100 MG tablet Commonly known as: NORFLEX Take 100 mg by mouth 2 (two) times daily as needed.   rosuvastatin 20 MG tablet Commonly known as: CRESTOR Take 20 mg by mouth at bedtime.   triamcinolone ointment 0.1 % Commonly known as: KENALOG Apply 1 Application topically 2 (two) times daily.   Trulicity 0.75 MG/0.5ML Soaj Generic drug: Dulaglutide Inject into the skin.        Allergies:  Allergies  Allergen Reactions   Bee Venom Anaphylaxis    Has epi pen   Dihydrocodeine Diarrhea   Hydrocodone-Acetaminophen Diarrhea   Codeine Diarrhea   Hydrocodone-Acetaminophen Diarrhea   Prednisone Diarrhea    Family History: Family History  Problem Relation Age of Onset   Heart failure Mother    Heart disease Mother    Diabetes Mother    Heart attack Father    Heart disease Father     Social History:   reports that he has never smoked. He has never used smokeless tobacco. He reports that he does not drink alcohol and does not use drugs.  Physical Exam: BP (!) 145/74 (BP Location: Left Arm, Patient Position: Sitting, Cuff Size: Large)   Pulse 97   Ht 6\' 2"  (1.88 m)   Wt (!) 349 lb (158.3 kg)   BMI 44.81 kg/m   Constitutional:  Alert and oriented, no acute distress, nontoxic appearing HEENT: [place], AT Cardiovascular: No clubbing, cyanosis, or edema Respiratory: Normal respiratory effort, no increased work of breathing Skin: No rashes, bruises or suspicious  lesions Neurologic: Grossly intact, no focal deficits, moving all 4 extremities Psychiatric: Normal mood and affect  Assessment & Plan:   1. Erectile dysfunction, unspecified erectile dysfunction type (Primary) ED, contributors include metabolic syndrome and obesity.  Will check a.m. testosterone today and consider TRT for results.  He does not desire future fertility, so would be a candidate for exogenous testosterone.  Will start demand dose tadalafil in the interim.  We also discussed Eroxon gel, which she does not wish to try.  We also discussed 2nd and 3rd line therapies including vacuum erection devices, intracavernosal injections, and IPP  placement. - Testosterone - tadalafil (CIALIS) 20 MG tablet; Take one tablet at least 60 minutes prior to sexual activity.  Dispense: 30 tablet; Refill: 5   Return for Will call with results.  Carman Ching, PA-C  [facility] [address], [address] [place], [place] [address] [phone]

## 2023-04-25 LAB — TESTOSTERONE: Testosterone: 386 ng/dL (ref 264–916)

## 2023-04-26 ENCOUNTER — Other Ambulatory Visit: Payer: Self-pay

## 2023-04-26 ENCOUNTER — Emergency Department
Admission: EM | Admit: 2023-04-26 | Discharge: 2023-04-26 | Disposition: A | Discharge status: Home / Self Care | Attending: Emergency Medicine | Admitting: Emergency Medicine

## 2023-04-26 ENCOUNTER — Emergency Department

## 2023-04-26 DIAGNOSIS — I10 Essential (primary) hypertension: Secondary | ICD-10-CM | POA: Insufficient documentation

## 2023-04-26 DIAGNOSIS — Y92000 Kitchen of unspecified non-institutional (private) residence as  the place of occurrence of the external cause: Secondary | ICD-10-CM | POA: Diagnosis not present

## 2023-04-26 DIAGNOSIS — E119 Type 2 diabetes mellitus without complications: Secondary | ICD-10-CM | POA: Insufficient documentation

## 2023-04-26 DIAGNOSIS — W01198A Fall on same level from slipping, tripping and stumbling with subsequent striking against other object, initial encounter: Secondary | ICD-10-CM | POA: Insufficient documentation

## 2023-04-26 DIAGNOSIS — S300XXA Contusion of lower back and pelvis, initial encounter: Secondary | ICD-10-CM | POA: Diagnosis not present

## 2023-04-26 DIAGNOSIS — M545 Low back pain, unspecified: Secondary | ICD-10-CM | POA: Diagnosis present

## 2023-04-26 MED ORDER — ONDANSETRON 4 MG PO TBDP
4.0000 mg | ORAL_TABLET | Freq: Once | ORAL | Status: AC
  Administered 2023-04-26: 4 mg via ORAL
  Filled 2023-04-26: qty 1

## 2023-04-26 MED ORDER — LIDOCAINE 5 % EX PTCH
1.0000 | MEDICATED_PATCH | Freq: Once | CUTANEOUS | Status: DC
Start: 2023-04-26 — End: 2023-04-26
  Administered 2023-04-26: 1 via TRANSDERMAL
  Filled 2023-04-26: qty 1

## 2023-04-26 MED ORDER — OXYCODONE-ACETAMINOPHEN 5-325 MG PO TABS: 1.0000 | ORAL_TABLET | ORAL | 0 refills | Status: DC | PRN

## 2023-04-26 MED ORDER — MORPHINE SULFATE (PF) 4 MG/ML IV SOLN
4.0000 mg | Freq: Once | INTRAVENOUS | Status: AC
  Administered 2023-04-26: 4 mg via INTRAMUSCULAR
  Filled 2023-04-26: qty 1

## 2023-04-26 NOTE — ED Triage Notes (Signed)
 Pt to ED with wife for mechanical fall about 2 hours ago, fell onto dishwasher door and hit L lower back. Took ibuprofen and muscle relaxer PTA. When walks has sharp stabbing pain to lower back.

## 2023-04-26 NOTE — ED Notes (Signed)
 See first nurse note: Pt denies hitting head or LOC. Pt denies any new incontinence or numbness or tingling. NAD noted. Pt is A&Ox4.

## 2023-04-26 NOTE — Discharge Instructions (Signed)
 Apply ice to the lower back.  Follow-up with your doctor as needed.  Follow-up with orthopedics not improving 1 week.  Take your muscle relaxer, ibuprofen, and pain medication as needed.  Consider buying some Lidoderm patches over-the-counter.  Return if worsening Your x-ray today was reassuring, they did not see a fracture

## 2023-04-26 NOTE — ED Notes (Signed)
 Pt at [facility]

## 2023-04-26 NOTE — ED Provider Notes (Signed)
 [facility] Provider Note    Event Date/Time   First MD Initiated Contact with Patient 04/26/23 1733     (approximate)   History   Fall and Back Pain   HPI  Danny Bauer is a 43 y.o. male history of hypertension, diabetes, GERD, sleep apnea presents emergency department after a fall.  Patient was in the kitchen when he fell backwards landing on the dishwasher door hitting the left lower back.  Patient took ibuprofen and muscle relaxer without any relief.  Sharp stabbing pain to the lower back with ambulation.  No numbness or tingling.  Patient is able to bear weight and walk      Physical Exam   Triage Vital Signs: ED Triage Vitals  Encounter Vitals Group     BP 04/26/23 1723 (!) 123/59     Systolic BP Percentile --      Diastolic BP Percentile --      Pulse Rate 04/26/23 1723 87     Resp 04/26/23 1723 20     Temp 04/26/23 1723 98.2 F (36.8 C)     Temp Source 04/26/23 1723 Oral     SpO2 04/26/23 1723 99 %     Weight --      Height --      Head Circumference --      Peak Flow --      Pain Score 04/26/23 1720 1     Pain Loc --      Pain Education --      Exclude from Growth Chart --     Most recent vital signs: Vitals:   04/26/23 1723 04/26/23 1856  BP: (!) 123/59   Pulse: 87 85  Resp: 20 19  Temp: 98.2 F (36.8 C)   SpO2: 99% 99%     General: Awake, no distress.   CV:  Good peripheral perfusion. regular rate and  rhythm Resp:  Normal effort.  Abd:  No distention.   Other:  Lumbar spine tender around L3-L4-L5, left side soft tissue tenderness, no bruising appreciated   ED Results / Procedures / Treatments   Labs (all labs ordered are listed, but only abnormal results are displayed) Labs Reviewed - No data to display   EKG     RADIOLOGY X-ray lumbar spine    PROCEDURES:   Procedures Chief Complaint  Patient presents with   Fall   Back Pain      MEDICATIONS ORDERED IN ED: Medications  lidocaine  (LIDODERM) 5 % 1 patch (1 patch Transdermal Patch Applied 04/26/23 1848)  morphine (PF) 4 MG/ML injection 4 mg (4 mg Intramuscular Given 04/26/23 1822)  ondansetron (ZOFRAN-ODT) disintegrating tablet 4 mg (4 mg Oral Given 04/26/23 1822)     IMPRESSION / MDM / ASSESSMENT AND PLAN / ED COURSE  I reviewed the triage vital signs and the nursing notes.                              Differential diagnosis includes, but is not limited to, lumbar contusion, lumbar strain, fracture  Patient's presentation is most consistent with acute illness / injury with system symptoms.   Patient was given morphine 4 mg IM, Zofran 4 mg ODT  X-ray of the lumbar spine  X-ray of the lumbar spine independently reviewed interpreted by me as being negative for acute abnormality  The patient is feeling a little better with feeling morphine and Zofran.  He  was able to stand on his own although movement does still recreate pain.  5 or 5 strength lower extremities.  We did discuss treatment plan.  I did explain to him he does not have fracture this is more of a contusion and a strain.  He is to apply ice to the lower back.  We gave him a Lidoderm patch here in the ED.  He can continue his muscle relaxer and ibuprofen.  Due to his diabetes I do not feel he should be on a steroid at this time.  Will try anti-inflammatories, muscle relaxer, and some pain medication.  He is to follow-up with his regular doctor or orthopedics if not improving in 5 to 7 days.  Return emergency department if worsening.  Signs and symptoms of cauda equina discussed.  He is in agreement treatment plan.  Discharged in stable condition.     FINAL CLINICAL IMPRESSION(S) / ED DIAGNOSES   Final diagnoses:  Lumbar contusion, initial encounter     Rx / DC Orders   ED Discharge Orders          Ordered    oxyCODONE-acetaminophen (PERCOCET) 5-325 MG tablet  Every 4 hours PRN        04/26/23 1839             Note:  This document was  prepared using Dragon voice recognition software and may include unintentional dictation errors.    Delsie Figures, PA-C 04/26/23 Rudy Costain, MD 04/26/23 2017

## 2023-04-30 ENCOUNTER — Encounter: Admitting: Urology

## 2023-05-07 LAB — BASIC METABOLIC PANEL WITH GFR
BUN/Creatinine Ratio: 14 (ref 9–20)
BUN: 12 mg/dL (ref 6–24)
CO2: 18 mmol/L — ABNORMAL LOW (ref 20–29)
Calcium: 9.3 mg/dL (ref 8.7–10.2)
Chloride: 104 mmol/L (ref 96–106)
Creatinine, Ser: 0.83 mg/dL (ref 0.76–1.27)
Glucose: 319 mg/dL — ABNORMAL HIGH (ref 70–99)
Potassium: 3.7 mmol/L (ref 3.5–5.2)
Sodium: 140 mmol/L (ref 134–144)
eGFR: 111 mL/min/{1.73_m2} (ref >59)

## 2023-05-13 ENCOUNTER — Ambulatory Visit: Attending: Cardiology

## 2023-05-13 DIAGNOSIS — R600 Localized edema: Secondary | ICD-10-CM

## 2023-05-13 LAB — ECHOCARDIOGRAM COMPLETE
AR max vel: 3.75 cm2
AV Area VTI: 3.85 cm2
AV Area mean vel: 3.96 cm2
AV Mean grad: 5 mmHg
AV Peak grad: 8.8 mmHg
Ao pk vel: 1.48 m/s
Area-P 1/2: 4.6 cm2
Calc EF: 61.2 %
S' Lateral: 2.9 cm
Single Plane A2C EF: 62.6 %
Single Plane A4C EF: 59.3 %

## 2023-06-09 ENCOUNTER — Emergency Department

## 2023-06-09 ENCOUNTER — Other Ambulatory Visit: Payer: Self-pay

## 2023-06-09 ENCOUNTER — Emergency Department
Admission: EM | Admit: 2023-06-09 | Discharge: 2023-06-09 | Disposition: A | Discharge status: Home / Self Care | Attending: Emergency Medicine | Admitting: Emergency Medicine

## 2023-06-09 DIAGNOSIS — M545 Low back pain, unspecified: Secondary | ICD-10-CM | POA: Diagnosis present

## 2023-06-09 DIAGNOSIS — M51361 Other intervertebral disc degeneration, lumbar region with lower extremity pain only: Secondary | ICD-10-CM | POA: Insufficient documentation

## 2023-06-09 DIAGNOSIS — I10 Essential (primary) hypertension: Secondary | ICD-10-CM | POA: Diagnosis not present

## 2023-06-09 DIAGNOSIS — E119 Type 2 diabetes mellitus without complications: Secondary | ICD-10-CM | POA: Diagnosis not present

## 2023-06-09 DIAGNOSIS — J45909 Unspecified asthma, uncomplicated: Secondary | ICD-10-CM | POA: Insufficient documentation

## 2023-06-09 MED ORDER — NAPROXEN 500 MG PO TABS: 500.0000 mg | ORAL_TABLET | Freq: Two times a day (BID) | ORAL | 0 refills | Status: AC

## 2023-06-09 MED ORDER — KETOROLAC TROMETHAMINE 15 MG/ML IJ SOLN
15.0000 mg | Freq: Once | INTRAMUSCULAR | Status: AC
  Administered 2023-06-09: 15 mg via INTRAMUSCULAR
  Filled 2023-06-09: qty 1

## 2023-06-09 NOTE — Discharge Instructions (Addendum)
 Please return to the emergency department for any new, worsening, or changing symptoms or other concerns including weakness in your legs, urinary or stool incontinence or retention, numbness or tingling in your extremities/buttocks/groin, fevers, or any other concerns or change in symptoms.  You may take the medication as prescribed but do not take with other NSAIDs.  Please follow-up with spine surgery.

## 2023-06-09 NOTE — ED Triage Notes (Signed)
 Pt to ED for thigh pain to both legs since 1 week ago Went to [facility] on Friday, told they can't do imaging there Pain is only with walking Pt has steady gait

## 2023-06-09 NOTE — ED Provider Notes (Signed)
 [facility] Provider Note    Event Date/Time   First MD Initiated Contact with Patient 06/09/23 1232     (approximate)   History   Leg Pain   HPI  Danny Bauer is a 43 y.o. male with a past medical history of morbid obesity, type 2 diabetes who presents today for evaluation of bilateral thigh pain, though right leg worse than left leg.  He reports that he does not do much in the way of physical activity.  He has not had any dark-colored urine.  He reports that his pain is worsened when he goes from sitting to standing.  He has some pain in his low back as well.  No urinary fecal incontinence or retention.  No saddle anesthesia.  No abdominal pain.  No weakness in his legs.  No recent illnesses.  Patient Active Problem List   Diagnosis Date Noted   Allergy to bee sting 03/24/2019   Encounter to establish care 02/25/2019   Right knee pain 02/25/2019   Type 2 diabetes mellitus without complication, without long-term current use of insulin (HCC) 05/27/2016   Gout 05/30/2014   MORBID OBESITY 12/17/2007   Sleep apnea 12/17/2007   CARPAL TUNNEL SYNDROME 07/01/2007   Essential hypertension 10/07/2006   ASTHMA 08/27/2006   GERD 08/27/2006          Physical Exam   Triage Vital Signs: ED Triage Vitals [06/09/23 1209]  Encounter Vitals Group     BP (!) 140/63     Systolic BP Percentile      Diastolic BP Percentile      Pulse Rate 90     Resp 18     Temp 98.2 F (36.8 C)     Temp Source Oral     SpO2 99 %     Weight (!) 345 lb (156.5 kg)     Height 6\' 2"  (1.88 m)     Head Circumference      Peak Flow      Pain Score 2     Pain Loc      Pain Education      Exclude from Growth Chart     Most recent vital signs: Vitals:   06/09/23 1209  BP: (!) 140/63  Pulse: 90  Resp: 18  Temp: 98.2 F (36.8 C)  SpO2: 99%    Physical Exam Vitals and nursing note reviewed.  Constitutional:      General: Awake and alert. No acute distress.     Appearance: Normal appearance. The patient is normal weight.  HENT:     Head: Normocephalic and atraumatic.     Mouth: Mucous membranes are moist.  Eyes:     General: PERRL. Normal EOMs        Right eye: No discharge.        Left eye: No discharge.     Conjunctiva/sclera: Conjunctivae normal.  Cardiovascular:     Rate and Rhythm: Normal rate and regular rhythm.     Pulses: Normal pulses.     Heart sounds: Normal heart sounds Pulmonary:     Effort: Pulmonary effort is normal. No respiratory distress.     Breath sounds: Normal breath sounds.  Abdominal:     Abdomen is soft. There is no abdominal tenderness. No rebound or guarding. No distention. Musculoskeletal:        General: No swelling. Normal range of motion.     Cervical back: Normal range of motion and neck supple.  Back:  No midline tenderness. Strength and sensation 5/5 to bilateral lower extremities. Normal great toe extension against resistance. Normal sensation throughout feet. Normal patellar reflexes. Negative SLR and opposite SLR bilaterally. Negative FABER test Skin:    General: Skin is warm and dry.     Capillary Refill: Capillary refill takes less than 2 seconds.     Findings: No rash.  Neurological:     Mental Status: The patient is awake and alert.      ED Results / Procedures / Treatments   Labs (all labs ordered are listed, but only abnormal results are displayed) Labs Reviewed - No data to display   EKG     RADIOLOGY I independently reviewed and interpreted imaging and agree with radiologists findings.     PROCEDURES:  Critical Care performed:   Procedures   MEDICATIONS ORDERED IN ED: Medications  ketorolac (TORADOL) 15 MG/ML injection 15 mg (15 mg Intramuscular Given 06/09/23 1251)     IMPRESSION / MDM / ASSESSMENT AND PLAN / ED COURSE  I reviewed the triage vital signs and the nursing notes.   Differential diagnosis includes, but is not limited to, spinal stenosis, lumbar  radiculopathy, degenerative joint disease.  Patient is awake and alert, hemodynamically stable and afebrile.  He has normal strength and sensation of bilateral lower extremities.  He is able to ambulate with a steady gait.  No recent illnesses or weakness to suggest Guillain-Barr.  No recent physical activity, no muscle pain, no [employer] colored urine to suggest rhabdomyolysis.  He has 5 out of 5 strength with intact sensation to extensor hallucis dorsiflexion and plantarflexion of bilateral lower extremities with normal patellar reflexes bilaterally. No red flags to indicate patient is at risk for more auspicious process that would require urgent/emergent spinal imaging or subspecialty evaluation at this time. No major trauma, no midline tenderness, no history or physical exam findings to suggest cauda equina syndrome or spinal cord compression. No focal neurological deficits on exam. No constitutional symptoms or IVDA to suggest potential for epidural abscess. Not anticoagulated, no history of bleeding diastasis to suggest risk for epidural hematoma. No chronic steroid use or advanced age or history of malignancy to suggest proclivity towards pathological fracture.  No abdominal pain or flank pain to suggest kidney stone, no history of kidney stone.  No fever or dysuria or CVAT to suggest pyelonephritis.  No chest pain, back pain, shortness of breath, neurological deficits, to suggest vascular catastrophe, and pulses are equal in all 4 extremities.    Discussed care instructions and return precautions with patient. Recommended close outpatient follow-up for re-evaluation. Patient agrees with plan of care. Will treat the patient symptomatically as needed for pain control. Will discharge patient to take these medications and return for any worsening or different pain or development of any neurologic symptoms. Educated patient regarding expected time course for back pain to improve and recommended very  close outpatient follow-up.  Patient understands and agrees with plan.  Discharged in stable condition.   Patient's presentation is most consistent with acute complicated illness / injury requiring diagnostic workup.    FINAL CLINICAL IMPRESSION(S) / ED DIAGNOSES   Final diagnoses:  Degeneration of intervertebral disc of lumbar region with lower extremity pain     Rx / DC Orders   ED Discharge Orders          Ordered    naproxen  (NAPROSYN ) 500 MG tablet  2 times daily with meals        06/09/23 1350  Note:  This document was prepared using Dragon voice recognition software and may include unintentional dictation errors.   Jamere Stidham E, PA-C 06/09/23 1405    Viviano Ground, MD 06/09/23 [phone]

## 2023-06-14 NOTE — Progress Notes (Unsigned)
 Cardiology Clinic Note   Date: 06/16/2023 ID: EDMON MAGID, DOB 1980/10/03, MRN [id number]  Primary Cardiologist:  Constancia Delton, MD  Chief Complaint   Danny Bauer is a 43 y.o. male who presents to the clinic today for follow up after testing.   Patient Profile   Danny Bauer is followed by Dr. Junnie Olives for the history outlined below.      Past medical history significant for: Lower extremity edema. Cardiac murmur. Echo 05/13/2023: EF 60 to 65%.  No RWMA.  Mild LVH.  Normal diastolic parameters. Normal RV size/function.  Mild LAE.  Mild MR.  Trivial AI. Hypertension. Hyperlipidemia. LDL 11/20/2022: LDL 37, HDL 26, TG 247, total 101. Asthma. OSA. CPAP with 100% adherence.  GERD. T2DM.  In summary, patient was first evaluated by Dr. Junnie Olives on 04/16/2023 for lower extremity edema.  Patient reported a 3 to 41-month history of lower extremity edema without chest pain or shortness of breath.  He was prescribed Lasix  with good effect.  He was out of Lasix  at the time of his visit.  He reported a family history of CAD with father with MI in his 17s and CHF.  Patient was instructed to stop Cardizem  and lisinopril /HCTZ and start losartan  and daily Lasix .  He had an echo which demonstrated normal LV/RV function.     History of Present Illness    Today, patient reports continued lower extremity edema.  He reports extremities normal in the morning and edema progresses throughout the day. He eats a high sodium diet with a lot of processed foods. He does not typically add salt to his food.  Patient denies shortness of breath, dyspnea on exertion, orthopnea or PND. No chest pain, pressure, or tightness. No palpitations. 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 He does a lot of sitting for either role of his job. He does not do regular exercise.     ROS: All other systems reviewed and are otherwise negative except as noted in History of Present  Illness.  EKGs/Labs Reviewed       EKG is not ordered today.   12/18/2022: ALT 28; AST 40 05/06/2023: BUN 12; Creatinine, Ser 0.83; Potassium 3.7; Sodium 140   12/18/2022: Hemoglobin 11.1; WBC 4.8    Physical Exam    VS:  BP 120/60 (BP Location: Left Arm, Patient Position: Sitting, Cuff Size: Large)   Pulse 92   Ht 6\' 2"  (1.88 m)   Wt (!) 360 lb 4 oz (163.4 kg)   SpO2 98%   BMI 46.25 kg/m  , BMI Body mass index is 46.25 kg/m.  GEN: Well nourished, well developed, in no acute distress. Neck: No JVD or carotid bruits. Cardiac: RRR. 2/6 systolic murmur. No rubs or gallops.   Respiratory:  Respirations regular and unlabored. Clear to auscultation without rales, wheezing or rhonchi. GI: Soft, nontender, nondistended. Extremities: Radials/DP/PT 2+ and equal bilaterally. No clubbing or cyanosis. 1+ pitting edema bilateral lower extremities.   Skin: Warm and dry, no rash. Neuro: Strength intact.  Assessment & Plan   Lower extremity edema Patient reports continued lower extremity edema. He reports extremities normal in the morning and edema progresses throughout the day. He eats a high sodium diet with a lot of processed foods. He sits with his legs in a dependent position during the day. Educated on the importance of a low sodium diet. 1+ pitting edema bilateral lower extremities.  - Continue Lasix . - BMP today.   Mitral  regurgitation Echo April 2025 showed normal LV/RV function, mild LVH, normal diastolic parameters, mild LAE, mild MR, trivial AI.  Patient denies shortness of breath, lightheadedness, dizziness, presyncope or syncope. 2/6 systolic murmur on exam today.  - Repeat echo as clinically indicated.  Hypertension BP today 120/60. No headaches or dizziness reported.  - Continue losartan .  Hyperlipidemia LDL 37 November 2024, at goal. Triglycerides elevated 247. Discussed increasing physical activity.  - Continue rosuvastatin. - Increase physical activity as tolerated.    Disposition: BMP today. Return in 6 months or sooner as needed.          Signed, Lonell Rives. Veleta Yamamoto, DNP, NP-C

## 2023-06-16 ENCOUNTER — Ambulatory Visit: Admitting: Cardiology

## 2023-06-16 ENCOUNTER — Ambulatory Visit: Attending: Student | Admitting: Student

## 2023-06-16 ENCOUNTER — Encounter: Payer: Self-pay | Admitting: Student

## 2023-06-16 VITALS — BP 120/60 | HR 92 | Ht 74.0 in | Wt 360.2 lb

## 2023-06-16 DIAGNOSIS — Z79899 Other long term (current) drug therapy: Secondary | ICD-10-CM | POA: Diagnosis not present

## 2023-06-16 DIAGNOSIS — I10 Essential (primary) hypertension: Secondary | ICD-10-CM | POA: Diagnosis not present

## 2023-06-16 DIAGNOSIS — I340 Nonrheumatic mitral (valve) insufficiency: Secondary | ICD-10-CM | POA: Diagnosis not present

## 2023-06-16 DIAGNOSIS — R600 Localized edema: Secondary | ICD-10-CM

## 2023-06-16 DIAGNOSIS — E7800 Pure hypercholesterolemia, unspecified: Secondary | ICD-10-CM

## 2023-06-16 NOTE — Patient Instructions (Signed)
 Medication Instructions:  Your Physician recommend you continue on your current medication as directed.    *If you need a refill on your cardiac medications before your next appointment, please call your pharmacy*  Lab Work: Your provider would like for you to have following labs drawn today [facility].   If you have labs (blood work) drawn today and your tests are completely normal, you will receive your results only by: MyChart Message (if you have MyChart) OR A paper copy in the mail If you have any lab test that is abnormal or we need to change your treatment, we will call you to review the results.  Testing/Procedures: None ordered at this time   Follow-Up: At [facility], you and your health needs are our priority.  As part of our continuing mission to provide you with exceptional heart care, our providers are all part of one team.  This team includes your primary Cardiologist (physician) and Advanced Practice Providers or APPs (Physician Assistants and Nurse Practitioners) who all work together to provide you with the care you need, when you need it.  Your next appointment:   6 month(s)  Provider:   You may see Constancia Delton, MD or one of the following Advanced Practice Providers on your designated Care Team:   Laneta Pintos, NP Gildardo Labrador, PA-C Varney Gentleman, PA-C Cadence [place], PA-C Ronald Cockayne, NP Morey Ar, NP    We recommend signing up for the patient portal called "MyChart".  Sign up information is provided on this After Visit Summary.  MyChart is used to connect with patients for Virtual Visits (Telemedicine).  Patients are able to view lab/test results, encounter notes, upcoming appointments, etc.  Non-urgent messages can be sent to your provider as well.   To learn more about what you can do with MyChart, go to [email or web address].

## 2023-06-17 ENCOUNTER — Ambulatory Visit: Payer: Self-pay | Admitting: Student

## 2023-06-17 LAB — BASIC METABOLIC PANEL WITH GFR
BUN/Creatinine Ratio: 12 (ref 9–20)
BUN: 10 mg/dL (ref 6–24)
CO2: 20 mmol/L (ref 20–29)
Calcium: 8.5 mg/dL — ABNORMAL LOW (ref 8.7–10.2)
Chloride: 104 mmol/L (ref 96–106)
Creatinine, Ser: 0.86 mg/dL (ref 0.76–1.27)
Glucose: 269 mg/dL — ABNORMAL HIGH (ref 70–99)
Potassium: 3.7 mmol/L (ref 3.5–5.2)
Sodium: 140 mmol/L (ref 134–144)
eGFR: 110 mL/min/{1.73_m2} (ref >59)

## 2023-06-20 ENCOUNTER — Other Ambulatory Visit: Payer: Self-pay | Admitting: Family Medicine

## 2023-06-20 ENCOUNTER — Inpatient Hospital Stay
Admission: RE | Admit: 2023-06-20 | Discharge: 2023-06-20 | Disposition: A | Discharge status: Home / Self Care | Payer: Self-pay | Source: Ambulatory Visit | Attending: Orthopedic Surgery | Admitting: Orthopedic Surgery

## 2023-06-20 DIAGNOSIS — Z049 Encounter for examination and observation for unspecified reason: Secondary | ICD-10-CM

## 2023-06-20 NOTE — Progress Notes (Signed)
 Referring Physician:  No referring provider defined for this encounter.  Primary Physician:  [facility]  History of Present Illness: 06/23/2023 [name] has a history of heart murmur, HTN, hyperlipidemia, asthma, OSA, GERD, DM, obesity, gout, OSA  Seen in ED On 06/09/23 for LBP and bilateral leg pain.   3 week history of intermittent LBP with bilateral leg pain lateral to his knees. Pain is worse with movement- standing, walking, bending. Pain is better with laying down or sitting. No numbness, tingling, or weakness in his legs. No known injury. No previous back issues.   Given naproxen  from ED- helped a little. He is taking mobic  now.   Last HgbA1c on 11/20/22 was 7.5.   Bowel/Bladder Dysfunction: none  Conservative measures:  Physical therapy: has not participated in PT  Multimodal medical therapy including regular antiinflammatories: Flexeril , Celebrex, Medrol  Dospak, norflex Injections: no epidural steroid injections  Past Surgery: no  spinal surgeries  Danny Bauer has no symptoms of cervical myelopathy.  The symptoms are causing a significant impact on the patient's life.   Review of Systems:  A 10 point review of systems is negative, except for the pertinent positives and negatives detailed in the HPI.  Past Medical History: Past Medical History:  Diagnosis Date   Diabetes mellitus without complication (HCC)    GERD (gastroesophageal reflux disease)    Hypertension    Sleep apnea, obstructive    sees Dr. Vernadine Golas     Past Surgical History: Past Surgical History:  Procedure Laterality Date   NOSE SURGERY  2009   straighten nasal passage    TOE AMPUTATION  2001   right great toe    Allergies: Allergies as of 06/23/2023 - Review Complete 06/23/2023  Allergen Reaction Noted   Bee venom Anaphylaxis 12/13/2012   Dihydrocodeine Diarrhea 07/23/2022   Hydrocodone-acetaminophen  Diarrhea 04/24/2023   Codeine  Diarrhea 11/29/2010    Hydrocodone-acetaminophen  Diarrhea 02/26/2006   Prednisone  Diarrhea 02/26/2006    Medications: Outpatient Encounter Medications as of 06/23/2023  Medication Sig   blood glucose meter kit and supplies KIT Dispense based on patient and insurance preference. Use up to four times daily as directed. (FOR ICD-9 250.00, 250.01).   cetirizine (ZYRTEC) 10 MG tablet Take 10 mg by mouth daily.   cyclobenzaprine  (FLEXERIL ) 5 MG tablet Take 1 tablet (5 mg total) by mouth 3 (three) times daily as needed.   EPINEPHrine  (EPIPEN  2-PAK) 0.3 mg/0.3 mL IJ SOAJ injection Inject 0.3 mg into the muscle as needed for anaphylaxis.   EPINEPHrine  0.3 mg/0.3 mL IJ SOAJ injection Inject 0.3 mg into the muscle as needed for anaphylaxis.   fluconazole (DIFLUCAN) 150 MG tablet Take one tablet today, then the second tablet 72 hours (3 days) later.   fluticasone (FLONASE) 50 MCG/ACT nasal spray Place into both nostrils.   furosemide  (LASIX ) 20 MG tablet Take 2 tablets (40 mg total) by mouth daily.   glipiZIDE  (GLUCOTROL ) 10 MG tablet Take 10 mg by mouth 2 (two) times daily.   glucose blood (ONETOUCH VERIO) test strip 1 each by Other route daily. And lancets 1/day   JANUVIA  50 MG tablet Take 50 mg by mouth daily.   JARDIANCE 25 MG TABS tablet Take 25 mg by mouth daily.   losartan  (COZAAR ) 100 MG tablet Take 1 tablet (100 mg total) by mouth daily.   meloxicam  (MOBIC ) 15 MG tablet Take 15 mg by mouth daily.   metFORMIN  (GLUCOPHAGE ) 1000 MG tablet Take 1 tablet (1,000 mg total)  by mouth 2 (two) times daily with a meal.   omeprazole  (PRILOSEC) 20 MG capsule Take 1 capsule (20 mg total) by mouth every morning.   orphenadrine (NORFLEX) 100 MG tablet Take 100 mg by mouth 2 (two) times daily as needed.   rosuvastatin (CRESTOR) 20 MG tablet Take 20 mg by mouth at bedtime.   tadalafil  (CIALIS ) 20 MG tablet Take one tablet at least 60 minutes prior to sexual activity.   triamcinolone ointment (KENALOG) 0.1 % Apply 1 Application  topically 2 (two) times daily.   TRULICITY  0.75 MG/0.5ML SOAJ Inject into the skin.   No facility-administered encounter medications on file as of 06/23/2023.    Social History: Social History   Tobacco Use   Smoking status: Never   Smokeless tobacco: Never  Vaping Use   Vaping status: Never Used  Substance Use Topics   Alcohol use: No   Drug use: No    Family Medical History: Family History  Problem Relation Age of Onset   Heart failure Mother    Heart disease Mother    Diabetes Mother    Heart attack Father    Heart disease Father     Physical Examination: Vitals:   06/23/23 0947  BP: 130/68    General: Patient is well developed, well nourished, calm, collected, and in no apparent distress. Attention to examination is appropriate.  Respiratory: Patient is breathing without any difficulty.   NEUROLOGICAL:     Awake, alert, oriented to person, place, and time.  Speech is clear and fluent. Fund of knowledge is appropriate.   Cranial Nerves: Pupils equal round and reactive to light.  Facial tone is symmetric.    no posterior lumbar tenderness.   No abnormal lesions on exposed skin.   Strength: Side Biceps Triceps Deltoid Interossei Grip Wrist Ext. Wrist Flex.  R 5 5 5 5 5 5 5   L 5 5 5 5 5 5 5    Side Iliopsoas Quads Hamstring PF DF EHL  R 5 5 5 5 5 5   L 5 5 5 5 5 5    Reflexes are 2+ and symmetric at the biceps, brachioradialis, patella and achilles.   Hoffman's is absent.  Clonus is not present.   Bilateral upper and lower extremity sensation is intact to light touch.     He has no pain with IR/ER of both hips, but has limited ROM.   He has slight limp with ambulation.   Medical Decision Making  Imaging: Lumbar xrays dated 06/09/23:  FINDINGS: Five non-rib-bearing lumbar vertebra. Normal lumbar alignment. No fracture or compression deformity. Mild disc space narrowing and spurring most prominently affecting L3-L4. There is lower lumbar facet  hypertrophy. No visible pars defects or suspicious bone abnormality. Unremarkable sacroiliac joints.   IMPRESSION: Stable multilevel degenerative disc disease and lower lumbar facet hypertrophy.     Electronically Signed   By: Chadwick Colonel M.D.   On: 06/09/2023 13:06  I have personally reviewed the images and agree with the above interpretation.  Assessment and Plan: Mr. Mathes has a 3 week history of intermittent LBP with bilateral leg pain lateral to his knees. No numbness, tingling, or weakness in his legs. No known injury. No previous back issues.   He has known mild lumbar spondylosis and DDD.   Treatment options discussed with patient and following plan made:   - MRI of lumbar spine to further evaluate lumbar radiculopathy. No improvement with time or medications.  - Discussed PT for lumbar spine. He wants  to revisit after MRI.  - New prescription for neurontin 300mg  to start at night x 7 days, then do bid (lunch and night). Reviewed dosing and side effects.  - Allergy to po prednisone  (diarrhea). He's not had injections in the past.  - Will schedule follow up visit to review MRI results once I get them back.   I spent a total of 30 minutes in face-to-face and non-face-to-face activities related to this patient's care today including review of outside records, review of imaging, review of symptoms, physical exam, discussion of differential diagnosis, discussion of treatment options, and documentation.   Thank you for involving me in the care of this patient.   Lucetta Russel PA-C Dept. of Neurosurgery

## 2023-06-23 ENCOUNTER — Ambulatory Visit (INDEPENDENT_AMBULATORY_CARE_PROVIDER_SITE_OTHER): Admitting: Physician Assistant

## 2023-06-23 ENCOUNTER — Ambulatory Visit (INDEPENDENT_AMBULATORY_CARE_PROVIDER_SITE_OTHER): Admitting: Orthopedic Surgery

## 2023-06-23 ENCOUNTER — Encounter: Payer: Self-pay | Admitting: Physician Assistant

## 2023-06-23 ENCOUNTER — Encounter: Payer: Self-pay | Admitting: Orthopedic Surgery

## 2023-06-23 VITALS — BP 152/80 | HR 84 | Ht 74.0 in | Wt 355.0 lb

## 2023-06-23 VITALS — BP 130/68 | Ht 74.0 in | Wt 362.0 lb

## 2023-06-23 DIAGNOSIS — M5416 Radiculopathy, lumbar region: Secondary | ICD-10-CM

## 2023-06-23 DIAGNOSIS — M51362 Other intervertebral disc degeneration, lumbar region with discogenic back pain and lower extremity pain: Secondary | ICD-10-CM

## 2023-06-23 DIAGNOSIS — N476 Balanoposthitis: Secondary | ICD-10-CM | POA: Diagnosis not present

## 2023-06-23 DIAGNOSIS — M47816 Spondylosis without myelopathy or radiculopathy, lumbar region: Secondary | ICD-10-CM

## 2023-06-23 DIAGNOSIS — M4726 Other spondylosis with radiculopathy, lumbar region: Secondary | ICD-10-CM

## 2023-06-23 MED ORDER — GABAPENTIN 300 MG PO CAPS: ORAL_CAPSULE | ORAL | 0 refills | Status: DC

## 2023-06-23 MED ORDER — FLUCONAZOLE 150 MG PO TABS: ORAL_TABLET | ORAL | 0 refills | Status: DC

## 2023-06-23 NOTE — Progress Notes (Unsigned)
 06/23/2023 9:19 AM   [name] 07/26/1980 [id number]  CC: Chief Complaint  Patient presents with   Urinary Frequency   Other    Itching on scrotum   HPI: Danny Bauer is a 43 y.o. male with PMH DM2 on multiple medications including Jardiance, HTN, OSA on CPAP, ED on demand dose tadalafil , and balanoposthitis who presents today for evaluation of scrotal itching.   Today he reports several weeks of redness/irritation of the glans penis and scrotal itching. He took a dose of Diflucan around the time that his symptoms started and this helped, but did not completely resolve things.  Tadalafil  is working well for his ED. No priapism.  PMH: Past Medical History:  Diagnosis Date   Diabetes mellitus without complication (HCC)    GERD (gastroesophageal reflux disease)    Hypertension    Sleep apnea, obstructive    sees Dr. Vernadine Golas     Surgical History: Past Surgical History:  Procedure Laterality Date   NOSE SURGERY  2009   straighten nasal passage    TOE AMPUTATION  2001   right great toe    Home Medications:  Allergies as of 06/23/2023       Reactions   Bee Venom Anaphylaxis   Has epi pen   Dihydrocodeine Diarrhea   Hydrocodone-acetaminophen  Diarrhea   Codeine  Diarrhea   Hydrocodone-acetaminophen  Diarrhea   Prednisone  Diarrhea        Medication List        Accurate as of June 23, 2023  9:19 AM. If you have any questions, ask your nurse or doctor.          blood glucose meter kit and supplies Kit Dispense based on patient and insurance preference. Use up to four times daily as directed. (FOR ICD-9 250.00, 250.01).   cetirizine 10 MG tablet Commonly known as: ZYRTEC Take 10 mg by mouth daily.   cyclobenzaprine  5 MG tablet Commonly known as: FLEXERIL  Take 1 tablet (5 mg total) by mouth 3 (three) times daily as needed.   EPINEPHrine  0.3 mg/0.3 mL Soaj injection Commonly known as: EpiPen  2-Pak Inject 0.3 mg into the muscle as needed for  anaphylaxis.   EPINEPHrine  0.3 mg/0.3 mL Soaj injection Commonly known as: EPI-PEN Inject 0.3 mg into the muscle as needed for anaphylaxis.   fluticasone 50 MCG/ACT nasal spray Commonly known as: FLONASE Place into both nostrils.   furosemide  20 MG tablet Commonly known as: LASIX  Take 2 tablets (40 mg total) by mouth daily.   glipiZIDE  10 MG tablet Commonly known as: GLUCOTROL  Take 10 mg by mouth 2 (two) times daily.   glucose blood test strip Commonly known as: OneTouch Verio 1 each by Other route daily. And lancets 1/day   Januvia  50 MG tablet Generic drug: sitaGLIPtin  Take 50 mg by mouth daily.   Jardiance 25 MG Tabs tablet Generic drug: empagliflozin Take 25 mg by mouth daily.   losartan  100 MG tablet Commonly known as: COZAAR  Take 1 tablet (100 mg total) by mouth daily.   meloxicam  15 MG tablet Commonly known as: MOBIC  Take 15 mg by mouth daily.   metFORMIN  1000 MG tablet Commonly known as: GLUCOPHAGE  Take 1 tablet (1,000 mg total) by mouth 2 (two) times daily with a meal.   omeprazole  20 MG capsule Commonly known as: PRILOSEC Take 1 capsule (20 mg total) by mouth every morning.   orphenadrine 100 MG tablet Commonly known as: NORFLEX Take 100 mg by mouth 2 (two) times daily as  needed.   rosuvastatin 20 MG tablet Commonly known as: CRESTOR Take 20 mg by mouth at bedtime.   tadalafil  20 MG tablet Commonly known as: CIALIS  Take one tablet at least 60 minutes prior to sexual activity.   triamcinolone ointment 0.1 % Commonly known as: KENALOG Apply 1 Application topically 2 (two) times daily.   Trulicity  0.75 MG/0.5ML Soaj Generic drug: Dulaglutide  Inject into the skin.        Allergies:  Allergies  Allergen Reactions   Bee Venom Anaphylaxis    Has epi pen   Dihydrocodeine Diarrhea   Hydrocodone-Acetaminophen  Diarrhea   Codeine  Diarrhea   Hydrocodone-Acetaminophen  Diarrhea   Prednisone  Diarrhea    Family History: Family History   Problem Relation Age of Onset   Heart failure Mother    Heart disease Mother    Diabetes Mother    Heart attack Father    Heart disease Father     Social History:   reports that he has never smoked. He has never used smokeless tobacco. He reports that he does not drink alcohol and does not use drugs.  Physical Exam: BP (!) 152/80   Pulse 84   Ht 6\' 2"  (1.88 m)   Wt (!) 355 lb (161 kg)   BMI 45.58 kg/m   Constitutional:  Alert and oriented, no acute distress, nontoxic appearing HEENT: [place], AT Cardiovascular: No clubbing, cyanosis, or edema Respiratory: Normal respiratory effort, no increased work of breathing GU: Buried penis. One punctate area of erythema on the glans at about the 4 o'clock position, otherwise glans is normal appearing. Diffuse scrotal skin thickening, worse in the dependent areas, without purulence, crepitus, fluctuance, or skin breaks. Skin: No rashes, bruises or suspicious lesions Neurologic: Grossly intact, no focal deficits, moving all 4 extremities Psychiatric: Normal mood and affect  Assessment & Plan:   1. Balanoposthitis (Primary) Strongly suspect yeast etiology with Jardiance use and symptom improvement on Diflucan. Will treat with 2 doses this time, spaced 3 days apart. We discussed that if this becomes a frequent problem, he may need to pursue alternative meds for his diabetes. No changes needed in his regimen for now. - fluconazole (DIFLUCAN) 150 MG tablet; Take one tablet today, then the second tablet 72 hours (3 days) later.  Dispense: 2 tablet; Refill: 0   Return if symptoms worsen or fail to improve.  Kathreen Pare, PA-C  [facility] [address] [place], [place] [address] [phone]

## 2023-06-23 NOTE — Patient Instructions (Signed)
 It was so nice to see you today. Thank you so much for coming in.    You have some mild wear and tear in your back (arthritis) with degeneration of the discs.   I want to get an MRI of your lower back to look into things further. We will get this approved through your insurance and [facility] will call you to schedule the appointment. Ask about your patient responsibility. You do not need to pay this prior to getting MRI, they can bill you.    [facility] (building with the white pillars) is located off of [address]. The address is [address], [place], [place] [address].    After you have the MRI, it takes 14-21 days for me to get the results back. Once I have them, we will call you to schedule a follow up visit with me to review them.   I sent a prescription for gabapentin to help with nerve pain. Take one at night for 7 days, and then take one at lunch and one at night. Most common side effects are feeling sleepy, dizzy, or drunk. Call me if any issues.   Depending on MRI results, we will likely consider PT and possible injections.   Please do not hesitate to call if you have any questions or concerns. You can also message me in MyChart.   Lucetta Russel PA-C [phone]     The physicians and staff at [facility] at [facility] are committed to providing excellent care. You may receive a survey asking for feedback about your experience at our office. We value you your feedback and appreciate you taking the time to to fill it out. The [facility] leadership team is also available to discuss your experience in person, feel free to contact us  [phone].

## 2023-06-26 ENCOUNTER — Ambulatory Visit
Admission: RE | Admit: 2023-06-26 | Discharge: 2023-06-26 | Disposition: A | Discharge status: Home / Self Care | Source: Ambulatory Visit | Attending: Orthopedic Surgery | Admitting: Orthopedic Surgery

## 2023-06-26 DIAGNOSIS — M51362 Other intervertebral disc degeneration, lumbar region with discogenic back pain and lower extremity pain: Secondary | ICD-10-CM | POA: Insufficient documentation

## 2023-06-26 DIAGNOSIS — M47816 Spondylosis without myelopathy or radiculopathy, lumbar region: Secondary | ICD-10-CM | POA: Insufficient documentation

## 2023-06-26 DIAGNOSIS — M5416 Radiculopathy, lumbar region: Secondary | ICD-10-CM | POA: Insufficient documentation

## 2023-07-10 ENCOUNTER — Ambulatory Visit: Payer: Self-pay | Admitting: Neurosurgery

## 2023-07-10 ENCOUNTER — Other Ambulatory Visit: Payer: Self-pay | Admitting: Neurosurgery

## 2023-07-10 DIAGNOSIS — M5416 Radiculopathy, lumbar region: Secondary | ICD-10-CM

## 2023-07-10 NOTE — Progress Notes (Signed)
 Reviewed his images.  Demonstrates multiple levels of disc bulging with some nerve root impaction.  Likely causing symptoms of radiculopathy.  Making a referral for physical therapy and follow-up.

## 2023-07-18 ENCOUNTER — Emergency Department

## 2023-07-18 ENCOUNTER — Emergency Department
Admission: EM | Admit: 2023-07-18 | Discharge: 2023-07-18 | Disposition: A | Discharge status: Home / Self Care | Attending: Emergency Medicine | Admitting: Emergency Medicine

## 2023-07-18 ENCOUNTER — Other Ambulatory Visit: Payer: Self-pay

## 2023-07-18 DIAGNOSIS — M25572 Pain in left ankle and joints of left foot: Secondary | ICD-10-CM | POA: Insufficient documentation

## 2023-07-18 DIAGNOSIS — I10 Essential (primary) hypertension: Secondary | ICD-10-CM | POA: Diagnosis not present

## 2023-07-18 DIAGNOSIS — E119 Type 2 diabetes mellitus without complications: Secondary | ICD-10-CM | POA: Insufficient documentation

## 2023-07-18 DIAGNOSIS — J45909 Unspecified asthma, uncomplicated: Secondary | ICD-10-CM | POA: Diagnosis not present

## 2023-07-18 HISTORY — DX: Gout, unspecified: M10.9

## 2023-07-18 HISTORY — DX: Other intervertebral disc degeneration, lumbar region without mention of lumbar back pain or lower extremity pain: M51.369

## 2023-07-18 MED ORDER — KETOROLAC TROMETHAMINE 30 MG/ML IJ SOLN
30.0000 mg | Freq: Once | INTRAMUSCULAR | Status: AC
  Administered 2023-07-18: 30 mg via INTRAMUSCULAR
  Filled 2023-07-18: qty 1

## 2023-07-18 NOTE — ED Triage Notes (Signed)
 Pt to ED for L lateral foot pain since about 2 weeks ago. Hx gout. Denies known injury. Both LW are swollen--socks are tight and left marked indentation to ankle. Pt takes lasix  20mg  each AM. Also hx gout.

## 2023-07-18 NOTE — ED Provider Notes (Signed)
 [facility] Provider Note    Event Date/Time   First MD Initiated Contact with Patient 07/18/23 1310     (approximate)   History   Foot Pain   HPI  DARELD MCAULIFFE is a 43 y.o. male   presents to the ED with complaint of left lateral ankle pain for approximately 2 weeks.  Patient initially thought that this was a repeat gout flareup and began taking anti-inflammatories without relief.  Patient states that there is pain with weightbearing and walking in the same area.  He reports multiple injuries to his ankle in the past and has seen podiatry for these injuries.  Patient has a history of hypertension, gout, diabetes type 2, asthma, GERD, sleep apnea.      Physical Exam   Triage Vital Signs: ED Triage Vitals  Encounter Vitals Group     BP 07/18/23 1230 127/62     Girls Systolic BP Percentile --      Girls Diastolic BP Percentile --      Boys Systolic BP Percentile --      Boys Diastolic BP Percentile --      Pulse Rate 07/18/23 1230 78     Resp 07/18/23 1230 20     Temp 07/18/23 1230 97.8 F (36.6 C)     Temp Source 07/18/23 1230 Oral     SpO2 07/18/23 1230 99 %     Weight 07/18/23 1227 (!) 375 lb (170.1 kg)     Height 07/18/23 1227 6' 2 (1.88 m)     Head Circumference --      Peak Flow --      Pain Score 07/18/23 1227 7     Pain Loc --      Pain Education --      Exclude from Growth Chart --     Most recent vital signs: Vitals:   07/18/23 1230  BP: 127/62  Pulse: 78  Resp: 20  Temp: 97.8 F (36.6 C)  SpO2: 99%     General: Awake, no distress.  CV:  Good peripheral perfusion.  Resp:  Normal effort.  Abd:  No distention.  Other:  Left ankle lateral aspect with generalized tenderness on palpation.  No gross deformity noted.  Skin is intact.  Soft tissue edema present.  No discoloration.  Pulses are present.  Motor or sensory function intact.  Patient is able to flex and extend but increases pain.   ED Results / Procedures /  Treatments   Labs (all labs ordered are listed, but only abnormal results are displayed) Labs Reviewed - No data to display    RADIOLOGY Left ankle x-ray images were reviewed and interpreted by myself independent of the radiologist and no acute bony abnormality or dislocations were noted.  Official radiology report with no acute abnormality.  Small chronic fragment adjacent to the lateral malleolus may be an old avulsion injury per radiology.  Suggest MRI to further evaluate this.    PROCEDURES:  Critical Care performed:   Procedures   MEDICATIONS ORDERED IN ED: Medications  ketorolac  (TORADOL ) 30 MG/ML injection 30 mg (30 mg Intramuscular Given 07/18/23 1343)     IMPRESSION / MDM / ASSESSMENT AND PLAN / ED COURSE  I reviewed the triage vital signs and the nursing notes.   Differential diagnosis includes, but is not limited to, left ankle pain, gout, osteoarthritis, tendinitis, fracture, dislocation.  43 year old male presents to the ED with complaint of left ankle pain for 2 weeks.  Patient has a history of multiple ankle injuries in the past and has been seen by podiatry.  He states he does not have a current podiatrist.  X-rays were reassuring that there was no acute bony abnormality but does suggest a old chronic avulsion fracture adjacent to the lateral malleolus.  I discussed this with patient who states most likely this was an old injury.  An MRI can be done as outpatient if further investigation is needed.  A cam walker boot was applied and patient is instructed to ice and elevate as needed for swelling and to help with pain.  He was given Toradol  30 mg IM while in the ED.  He is instructed to continue with his meloxicam  that he has at home.  Information for Dr. Sikora who is on-call for podiatry was given to him for follow-up appointment.      Patient's presentation is most consistent with acute complicated illness / injury requiring diagnostic workup.  FINAL CLINICAL  IMPRESSION(S) / ED DIAGNOSES   Final diagnoses:  Acute left ankle pain     Rx / DC Orders   ED Discharge Orders     None        Note:  This document was prepared using Dragon voice recognition software and may include unintentional dictation errors.   Saunders Shona CROME, PA-C 07/18/23 1349    Suzanne Kirsch, MD 07/18/23 [phone]

## 2023-07-18 NOTE — Discharge Instructions (Addendum)
 Call make an appointment with the podiatrist on-call.  Dr. Sikora is on call and her office information is listed on your discharge papers.  Wear the cam walker boot for protection and support.  You do not have to wear this while sleeping but should be wearing it anytime you are up walking.  Continue taking the meloxicam .  Elevate your leg frequently and ice as needed.

## 2023-07-22 ENCOUNTER — Ambulatory Visit (INDEPENDENT_AMBULATORY_CARE_PROVIDER_SITE_OTHER)

## 2023-07-22 ENCOUNTER — Ambulatory Visit
Admission: EM | Admit: 2023-07-22 | Discharge: 2023-07-22 | Disposition: A | Discharge status: Home / Self Care | Attending: Emergency Medicine | Admitting: Emergency Medicine

## 2023-07-22 DIAGNOSIS — M25562 Pain in left knee: Secondary | ICD-10-CM

## 2023-07-22 NOTE — ED Provider Notes (Signed)
 Danny Bauer    CSN: [id number] Arrival date & time: 07/22/23  1800      History   Chief Complaint Chief Complaint  Patient presents with   Knee Pain    HPI Danny Bauer is a 43 y.o. male.  Patient presents with left posterior knee pain and swelling since last night.  He wore a knee brace today for support.  He took Tylenol .  No falls or trauma.  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  He denies numbness, weakness, bruising, redness, wounds, fever, chest pain, shortness of breath.  The history is provided by the patient and medical records.    Past Medical History:  Diagnosis Date   Bulging lumbar disc    Diabetes mellitus without complication (HCC)    GERD (gastroesophageal reflux disease)    Gout    Hypertension    Sleep apnea, obstructive    sees Dr. Norleen Notice     Patient Active Problem List   Diagnosis Date Noted   Allergy to bee sting 03/24/2019   Encounter to establish care 02/25/2019   Right knee pain 02/25/2019   Type 2 diabetes mellitus without complication, without long-term current use of insulin (HCC) 05/27/2016   Gout 05/30/2014   MORBID OBESITY 12/17/2007   Sleep apnea 12/17/2007   CARPAL TUNNEL SYNDROME 07/01/2007   Essential hypertension 10/07/2006   ASTHMA 08/27/2006   GERD 08/27/2006    Past Surgical History:  Procedure Laterality Date   NOSE SURGERY  2009   straighten nasal passage    TOE AMPUTATION  2001   right great toe       Home Medications    Prior to Admission medications   Medication Sig Start Date End Date Taking? Authorizing Provider  blood glucose meter kit and supplies KIT Dispense based on patient and insurance preference. Use up to four times daily as directed. (FOR ICD-9 250.00, 250.01). 02/25/19   Iloabachie, Chioma E, NP  cetirizine (ZYRTEC) 10 MG tablet Take 10 mg by mouth daily. 04/10/23   [provider]  EPINEPHrine  (EPIPEN  2-PAK) 0.3 mg/0.3 mL IJ SOAJ injection  Inject 0.3 mg into the muscle as needed for anaphylaxis. 10/19/21   Bradler, Evan K, MD  EPINEPHrine  0.3 mg/0.3 mL IJ SOAJ injection Inject 0.3 mg into the muscle as needed for anaphylaxis. 09/14/22   Lang Dover, MD  fluticasone [facility]) 50 MCG/ACT nasal spray Place into both nostrils. 02/13/23   [provider]  furosemide  (LASIX ) 20 MG tablet Take 2 tablets (40 mg total) by mouth daily. 04/16/23 07/15/23  Darliss Rogue, MD  gabapentin  (NEURONTIN ) 300 MG capsule 1 po q hs x 7 days, then increased to 1 po q lunch and 1 po q hs. 06/23/23   Hilma Hastings, PA-C  glipiZIDE  (GLUCOTROL ) 10 MG tablet Take 10 mg by mouth 2 (two) times daily. 12/02/22   [provider]  glucose blood (ONETOUCH VERIO) test strip 1 each by Other route daily. And lancets 1/day 03/05/18   Kassie Mallick, MD  JANUVIA  50 MG tablet Take 50 mg by mouth daily. 02/28/23   [provider]  JARDIANCE 25 MG TABS tablet Take 25 mg by mouth daily.    [provider]  losartan  (COZAAR ) 100 MG tablet Take 1 tablet (100 mg total) by mouth daily. 04/16/23 07/15/23  Darliss Rogue, MD  meloxicam  (MOBIC ) 15 MG tablet Take 15 mg by mouth daily.    [provider]  metFORMIN  (GLUCOPHAGE ) 1000 MG tablet Take 1 tablet (1,000 mg total) by mouth 2 (two) times daily with a meal. 02/25/19   Iloabachie, Chioma E, NP  omeprazole  (PRILOSEC) 20 MG capsule Take 1 capsule (20 mg total) by mouth every morning. 06/15/19   Iloabachie, Chioma E, NP  orphenadrine (NORFLEX) 100 MG tablet Take 100 mg by mouth 2 (two) times daily as needed. 04/16/23   [provider]  rosuvastatin (CRESTOR) 20 MG tablet Take 20 mg by mouth at bedtime.    [provider]  tadalafil  (CIALIS ) 20 MG tablet Take one tablet at least 60 minutes prior to sexual activity. 04/24/23   Vaillancourt, Samantha, PA-C  triamcinolone ointment (KENALOG) 0.1 % Apply 1 Application topically 2 (two) times daily. 02/13/23   [provider]   TRULICITY  0.75 MG/0.5ML SOAJ Inject into the skin. 12/25/22   [provider]    Family History Family History  Problem Relation Age of Onset   Heart failure Mother    Heart disease Mother    Diabetes Mother    Heart attack Father    Heart disease Father     Social History Social History   Tobacco Use   Smoking status: Never   Smokeless tobacco: Never  Vaping Use   Vaping status: Never Used  Substance Use Topics   Alcohol use: No   Drug use: No     Allergies   Bee venom, Dihydrocodeine, Hydrocodone-acetaminophen , Codeine , Hydrocodone-acetaminophen , and Prednisone    Review of Systems Review of Systems  Constitutional:  Negative for chills and fever.  Musculoskeletal:  Positive for arthralgias and joint swelling. Negative for gait problem.  Skin:  Negative for color change, rash and wound.  Neurological:  Negative for weakness and numbness.     Physical Exam Triage Vital Signs ED Triage Vitals [07/22/23 1844]  Encounter Vitals Group     BP (!) 144/81     Girls Systolic BP Percentile      Girls Diastolic BP Percentile      Boys Systolic BP Percentile      Boys Diastolic BP Percentile      Pulse Rate 86     Resp 18     Temp 98.3 F (36.8 C)     Temp src      SpO2 97 %     Weight      Height      Head Circumference      Peak Flow      Pain Score      Pain Loc      Pain Education      Exclude from Growth Chart    No data found.  Updated Vital Signs BP (!) 144/81   Pulse 86   Temp 98.3 F (36.8 C)   Resp 18   SpO2 97%   Visual Acuity Right Eye Distance:   Left Eye Distance:   Bilateral Distance:    Right Eye Near:   Left Eye Near:    Bilateral Near:     Physical Exam Constitutional:      General: He is not in acute distress.    Appearance: He is obese.  HENT:     Mouth/Throat:     Mouth: Mucous membranes are moist.  Cardiovascular:     Rate and Rhythm: Normal rate and regular rhythm.  Pulmonary:     Effort: Pulmonary  effort is normal. No respiratory distress.  Musculoskeletal:        General: Tenderness present. No swelling  or deformity. Normal range of motion.     Comments: Mild tenderness to palpation of the left posterior knee.  No edema noted.  No calf tenderness.  Skin:    General: Skin is warm and dry.     Capillary Refill: Capillary refill takes less than 2 seconds.     Findings: No bruising, erythema, lesion or rash.  Neurological:     General: No focal deficit present.     Mental Status: He is alert.     Sensory: No sensory deficit.     Motor: No weakness.     Gait: Gait normal.      UC Treatments / Results  Labs (all labs ordered are listed, but only abnormal results are displayed) Labs Reviewed - No data to display  EKG   Radiology DG Knee Complete 4 Views Left Result Date: 07/22/2023 CLINICAL DATA:  Pain.  No injury. EXAM: LEFT KNEE - COMPLETE 4+ VIEW COMPARISON:  None Available. FINDINGS: Moderate tricompartment degenerative changes with joint space narrowing and spurring, most pronounced in the patellofemoral compartment. No significant joint effusion. No acute bony abnormality. Specifically, no fracture, subluxation, or dislocation. IMPRESSION: Moderate tricompartment degenerative changes. No acute bony abnormality. Electronically Signed   By: Franky Crease M.D.   On: 07/22/2023 19:19    Procedures Procedures (including critical care time)  Medications Ordered in UC Medications - No data to display  Initial Impression / Assessment and Plan / UC Course  I have reviewed the triage vital signs and the nursing notes.  Pertinent labs & imaging results that were available during my care of the patient were reviewed by me and considered in my medical decision making (see chart for details).    Left knee pain.  Xray negative for acute bony abnormality.  Discussed symptomatic management, including continued wearing of his knee brace that he already has, Tylenol  or ibuprofen , rest,  elevation, ice packs.  Patient is an established patient at [facility].  Instructed him to follow-up with [facility] tomorrow.  ED precautions given.  Work note provided.  Patient agrees to plan of care.    Final Clinical Impressions(s) / UC Diagnoses   Final diagnoses:  Acute pain of left knee     Discharge Instructions      Follow-up with an orthopedist tomorrow.  Go to the emergency department if you have worsening symptoms.    Take Tylenol  or ibuprofen  as needed for discomfort.  Rest and elevate your knee.  Apply ice packs as directed.     ED Prescriptions   None    I have reviewed the PDMP during this encounter.   Corlis Burnard DEL, NP 07/22/23 1924

## 2023-07-22 NOTE — Discharge Instructions (Addendum)
 Follow-up with an orthopedist tomorrow.  Go to the emergency department if you have worsening symptoms.    Take Tylenol  or ibuprofen  as needed for discomfort.  Rest and elevate your knee.  Apply ice packs as directed.

## 2023-07-22 NOTE — ED Triage Notes (Addendum)
 Patient to Urgent Care with complaints of left sided knee pain. Reports swelling behind his knee.  Symptoms started last night. Has been wearing a knee brace today. Pain only with ambulation. 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

## 2023-07-29 ENCOUNTER — Ambulatory Visit: Admitting: Orthopedic Surgery

## 2023-08-01 NOTE — Progress Notes (Signed)
 Referring Physician:  No referring provider defined for this encounter.  Primary Physician:  [facility]  History of Present Illness: 08/04/2023 [name] has a history of heart murmur, HTN, hyperlipidemia, asthma, OSA, GERD, DM, obesity, gout, OSA  Last seen by me on 06/23/23 for intermittent LBP and bilateral leg pain. He has known mild lumbar spondylosis and DDD.   He was started on neurontin . Dr. Claudene sent him a message regarding his MRI and recommended PT. Initial evaluation at [facility] was on 07/28/23. Has second visit today.   He is here for follow up.   He is doing better. He continues with intermittent LBP that is less frequent. His bilateral leg pain is gone. He has pain in his knees- had improvement with injection in left, still has some right knee pain. He is able to stand and walk with no pain. No numbness, tingling, or weakness in his legs.  He feels like neurotin has helped. He is still on mobic .   Last HgbA1c on 11/20/22 was 7.5.   Bowel/Bladder Dysfunction: none  Conservative measures:  Physical therapy: initial evaluation at [facility] on 07/28/23, has second visit today.  Multimodal medical therapy including regular antiinflammatories: Flexeril , Celebrex, Medrol  Dospak, norflex Injections: no epidural steroid injections  Past Surgery: no  spinal surgeries  Danny Bauer has no symptoms of cervical myelopathy.  The symptoms are causing a significant impact on the patient's life.   Review of Systems:  A 10 point review of systems is negative, except for the pertinent positives and negatives detailed in the HPI.  Past Medical History: Past Medical History:  Diagnosis Date   Bulging lumbar disc    Diabetes mellitus without complication (HCC)    GERD (gastroesophageal reflux disease)    Gout    Hypertension    Sleep apnea, obstructive    sees Dr. Norleen Bauer     Past Surgical History: Past Surgical History:  Procedure  Laterality Date   NOSE SURGERY  2009   straighten nasal passage    TOE AMPUTATION  2001   right great toe    Allergies: Allergies as of 08/04/2023 - Review Complete 08/04/2023  Allergen Reaction Noted   Bee venom Anaphylaxis 12/13/2012   Dihydrocodeine Diarrhea 07/23/2022   Hydrocodone-acetaminophen  Diarrhea 04/24/2023   Codeine  Diarrhea 11/29/2010   Hydrocodone-acetaminophen  Diarrhea 02/26/2006   Prednisone  Diarrhea 02/26/2006    Medications: Outpatient Encounter Medications as of 08/04/2023  Medication Sig   blood glucose meter kit and supplies KIT Dispense based on patient and insurance preference. Use up to four times daily as directed. (FOR ICD-9 250.00, 250.01).   cetirizine (ZYRTEC) 10 MG tablet Take 10 mg by mouth daily.   EPINEPHrine  (EPIPEN  2-PAK) 0.3 mg/0.3 mL IJ SOAJ injection Inject 0.3 mg into the muscle as needed for anaphylaxis.   fluticasone (FLONASE) 50 MCG/ACT nasal spray Place into both nostrils.   furosemide  (LASIX ) 20 MG tablet Take 2 tablets (40 mg total) by mouth daily.   gabapentin  (NEURONTIN ) 300 MG capsule 1 po q hs x 7 days, then increased to 1 po q lunch and 1 po q hs.   glipiZIDE  (GLUCOTROL ) 10 MG tablet Take 10 mg by mouth 2 (two) times daily.   glucose blood (ONETOUCH VERIO) test strip 1 each by Other route daily. And lancets 1/day   JANUVIA  50 MG tablet Take 50 mg by mouth daily.   JARDIANCE 25 MG TABS tablet Take 25 mg by mouth daily.   losartan  (COZAAR ) 100  MG tablet Take 1 tablet (100 mg total) by mouth daily.   meloxicam  (MOBIC ) 15 MG tablet Take 15 mg by mouth daily.   metFORMIN  (GLUCOPHAGE ) 1000 MG tablet Take 1 tablet (1,000 mg total) by mouth 2 (two) times daily with a meal.   omeprazole  (PRILOSEC) 20 MG capsule Take 1 capsule (20 mg total) by mouth every morning.   orphenadrine (NORFLEX) 100 MG tablet Take 100 mg by mouth 2 (two) times daily as needed.   rosuvastatin (CRESTOR) 20 MG tablet Take 20 mg by mouth at bedtime.   tadalafil   (CIALIS ) 20 MG tablet Take one tablet at least 60 minutes prior to sexual activity.   triamcinolone ointment (KENALOG) 0.1 % Apply 1 Application topically 2 (two) times daily.   [DISCONTINUED] EPINEPHrine  0.3 mg/0.3 mL IJ SOAJ injection Inject 0.3 mg into the muscle as needed for anaphylaxis.   [DISCONTINUED] TRULICITY  0.75 MG/0.5ML SOAJ Inject into the skin.   No facility-administered encounter medications on file as of 08/04/2023.    Social History: Social History   Tobacco Use   Smoking status: Never   Smokeless tobacco: Never  Vaping Use   Vaping status: Never Used  Substance Use Topics   Alcohol use: No   Drug use: No    Family Medical History: Family History  Problem Relation Age of Onset   Heart failure Mother    Heart disease Mother    Diabetes Mother    Heart attack Father    Heart disease Father     Physical Examination: Vitals:   08/04/23 1508  BP: (!) 140/62      Awake, alert, oriented to person, place, and time.  Speech is clear and fluent. Fund of knowledge is appropriate.   Cranial Nerves: Pupils equal round and reactive to light.  Facial tone is symmetric.    No abnormal lesions on exposed skin.   Strength: Side Iliopsoas Quads Hamstring PF DF EHL  R 5 5 5 5 5 5   L 5 5 5 5 5 5    Clonus is not present.   Bilateral lower extremity sensation is intact to light touch.     He has no pain with IR/ER of both hips, but has limited ROM.   He has normal gait.   Medical Decision Making  Imaging: Lumbar MRI dated 06/26/23:  FINDINGS: The alignment is unremarkable. Mild degenerative disc disease L3-4 and L4-5. No discogenic endplate marrow edema. The marrow signal is unremarkable as well as the conus and cauda equina.   L3-4 has a small distal right foraminal protrusion causing mild foraminal narrowing. There is no overall broad-based disc bulge combined with facet/ligament flavum hypertrophy to cause a mild central stenosis.   L4-5 has a distal  right foraminal protrusion combined with facet hypertrophy to cause mild to moderate foraminal narrowing with possible contact of the exiting nerve. There is also mild degenerative left-sided foraminal and lateral recess narrowing.   L5-S1 has mild foraminal narrowing the left due to broad-based disc bulge and facet hypertrophy.   The imaged levels are otherwise unremarkable.   The imaged retroperitoneal structures are unremarkable.     IMPRESSION: Multilevel degenerative spondylosis with levels described in detail above.   L4-5 has a distal right foraminal protrusion contributing to mild-to-moderate foraminal narrowing. Possible contact of the exiting L4 nerve.   L3-4 has a smaller distal right foraminal protrusion contributing to mild foraminal narrowing. Also a mild overall degenerative central stenosis.   Electronically signed by: Reyes Frees MD 07/07/2023 10:43  AM EDT RP Workstation: [id number]  I have personally reviewed the images and agree with the above interpretation.  Assessment and Plan: Mr. Kirchman continues with intermittent LBP that is less frequent. His bilateral leg pain is gone. He has pain in his knees- had improvement with injection in left, still has some right knee pain. He is able to stand and walk with no pain. No numbness, tingling, or weakness in his legs.  He has known right foraminal disc L3-L4 with mild bilateral foraminal stenosis with left right foraminal disc L4-L5 with mild/moderate bilateral foraminal stenosis.   Treatment options discussed with patient and following plan made:   - Continue with PT for lumbar spine at [facility].  - Continue on neurontin  as prescribed.  - Discussed lumbar injections. Will hold off since pain is better. Of note, he has allergy to po prednisone  (diarrhea). He's not had injections in the past.  - Follow up with me in 6-8 weeks and prn.   BP was slightly elevated. No symptoms of chest pain, shortness of breath,  blurry vision, or headaches.      I spent a total of 20 minutes in face-to-face and non-face-to-face activities related to this patient's care today including review of outside records, review of imaging, review of symptoms, physical exam, discussion of differential diagnosis, discussion of treatment options, and documentation.   Glade Boys PA-C Dept. of Neurosurgery

## 2023-08-04 ENCOUNTER — Encounter: Payer: Self-pay | Admitting: Orthopedic Surgery

## 2023-08-04 ENCOUNTER — Ambulatory Visit (INDEPENDENT_AMBULATORY_CARE_PROVIDER_SITE_OTHER): Admitting: Orthopedic Surgery

## 2023-08-04 VITALS — BP 140/62 | Ht 74.0 in | Wt 352.1 lb

## 2023-08-04 DIAGNOSIS — M4726 Other spondylosis with radiculopathy, lumbar region: Secondary | ICD-10-CM | POA: Diagnosis not present

## 2023-08-04 DIAGNOSIS — M5416 Radiculopathy, lumbar region: Secondary | ICD-10-CM

## 2023-08-04 DIAGNOSIS — M47816 Spondylosis without myelopathy or radiculopathy, lumbar region: Secondary | ICD-10-CM

## 2023-08-04 DIAGNOSIS — M51362 Other intervertebral disc degeneration, lumbar region with discogenic back pain and lower extremity pain: Secondary | ICD-10-CM | POA: Diagnosis not present

## 2023-08-04 DIAGNOSIS — M48061 Spinal stenosis, lumbar region without neurogenic claudication: Secondary | ICD-10-CM

## 2023-08-12 ENCOUNTER — Encounter: Payer: Self-pay | Admitting: Emergency Medicine

## 2023-08-12 ENCOUNTER — Emergency Department: Admission: EM | Admit: 2023-08-12 | Discharge: 2023-08-12 | Disposition: A | Discharge status: Home / Self Care

## 2023-08-12 ENCOUNTER — Ambulatory Visit (INDEPENDENT_AMBULATORY_CARE_PROVIDER_SITE_OTHER): Payer: Self-pay | Admitting: Podiatry

## 2023-08-12 ENCOUNTER — Other Ambulatory Visit: Payer: Self-pay

## 2023-08-12 ENCOUNTER — Encounter: Payer: Self-pay | Admitting: Podiatry

## 2023-08-12 VITALS — Ht 74.0 in | Wt 375.0 lb

## 2023-08-12 DIAGNOSIS — I10 Essential (primary) hypertension: Secondary | ICD-10-CM | POA: Diagnosis not present

## 2023-08-12 DIAGNOSIS — M79642 Pain in left hand: Secondary | ICD-10-CM

## 2023-08-12 DIAGNOSIS — E119 Type 2 diabetes mellitus without complications: Secondary | ICD-10-CM | POA: Diagnosis not present

## 2023-08-12 DIAGNOSIS — M65972 Unspecified synovitis and tenosynovitis, left ankle and foot: Secondary | ICD-10-CM

## 2023-08-12 DIAGNOSIS — S60562A Insect bite (nonvenomous) of left hand, initial encounter: Secondary | ICD-10-CM | POA: Diagnosis present

## 2023-08-12 DIAGNOSIS — T63441A Toxic effect of venom of bees, accidental (unintentional), initial encounter: Secondary | ICD-10-CM

## 2023-08-12 DIAGNOSIS — W57XXXA Bitten or stung by nonvenomous insect and other nonvenomous arthropods, initial encounter: Secondary | ICD-10-CM | POA: Diagnosis not present

## 2023-08-12 MED ORDER — DIPHENHYDRAMINE HCL 25 MG PO CAPS
50.0000 mg | ORAL_CAPSULE | Freq: Once | ORAL | Status: AC
  Administered 2023-08-12: 50 mg via ORAL
  Filled 2023-08-12: qty 2

## 2023-08-12 MED ORDER — BETAMETHASONE SOD PHOS & ACET 6 (3-3) MG/ML IJ SUSP
3.0000 mg | Freq: Once | INTRAMUSCULAR | Status: AC
  Administered 2023-08-12: 3 mg via INTRA_ARTICULAR

## 2023-08-12 MED ORDER — FAMOTIDINE 20 MG PO TABS
40.0000 mg | ORAL_TABLET | Freq: Once | ORAL | Status: AC
  Administered 2023-08-12: 40 mg via ORAL
  Filled 2023-08-12: qty 2

## 2023-08-12 NOTE — ED Notes (Signed)
 See triage note  Presents with some swelling to left hand  States he was stung about 20 mins PTA

## 2023-08-12 NOTE — Discharge Instructions (Signed)
 You was seen in the emergency department for bee or wasp sting.  You were observed for several hours without any worsening of your condition.  Please take 25 mg of Benadryl  every 8 hours for 24 hours. if your symptoms acutely worsen please return for reevaluation.  Stick to bland foods today.  Please do not drive or operate heavy machinery for the next 48 hours. -- RETURN PRECAUTIONS & AFTERCARE: (ENGLISH) RETURN PRECAUTIONS: Return immediately to the emergency department or see/call your doctor if you feel worse, weak or have changes in speech or vision, are short of breath, have fever, vomiting, pain, bleeding or dark stool, trouble urinating or any new issues. Return here or see/call your doctor if not improving as expected for your suspected condition. FOLLOW-UP CARE: Call your doctor and/or any doctors we referred you to for more advice and to make an appointment. Do this today, tomorrow or after the weekend. Some doctors only take PPO insurance so if you have HMO insurance you may want to contact your HMO or your regular doctor for referral to a specialist within your plan. Either way tell the doctor's office that it was a referral from the emergency department so you get the soonest possible appointment.  YOUR TEST RESULTS: Take result reports of any blood or urine tests, imaging tests and EKG's to your doctor and any referral doctor. Have any abnormal tests repeated. Your doctor or a referral doctor can let you know when this should be done. Also make sure your doctor contacts this hospital to get any test results that are not currently available such as cultures or special tests for infection and final imaging reports, which are often not available at the time you leave the ER but which may list additional important findings that are not documented on the preliminary report. BLOOD PRESSURE: If your blood pressure was greater than 120/80 have your blood pressure rechecked within 1 to 2  weeks. MEDICATION SIDE EFFECTS: Do not drive, walk, bike, take the bus, etc. if you have received or are being prescribed any sedating medications such as those for pain or anxiety or certain antihistamines like Benadryl . If you have been give one of these here get a taxi home or have a friend drive you home. Ask your pharmacist to counsel you on potential side effects of any new medication

## 2023-08-12 NOTE — ED Triage Notes (Signed)
 Patient to ED via POV for a bee sting to left hand. States allergy to same. PT reports it feels swollen. Denies any difficulty breathing. Occurred approx 20 minutes.

## 2023-08-12 NOTE — ED Provider Notes (Signed)
 [facility] Provider Note    Event Date/Time   First MD Initiated Contact with Patient 08/12/23 0830     (approximate)   History   Insect Bite   HPI  Danny Bauer is a 43 y.o. male with hypertension hyperlipidemia type 2 diabetes and allergies to prednisone  with a history of anaphylaxis to bee stings who presents to the emergency department after 1 hour of bee sting to his left hand.  Patient was in his usual state of health and was working outdoors when a bee stung his left hand.  He immediately told his supervisor and came to our ED.  He has some pain and swelling in his left hand but denies any shortness of breath, difficulty handling his secretions, lightheadedness nausea vomiting or abdominal pain.  He has not taken any medication for this yet.  He does have EpiPen 's at home.  He does not feel as if this is similar to his prior episode of anaphylaxis      Physical Exam   Triage Vital Signs: ED Triage Vitals  Encounter Vitals Group     BP 08/12/23 0753 (!) 151/73     Girls Systolic BP Percentile --      Girls Diastolic BP Percentile --      Boys Systolic BP Percentile --      Boys Diastolic BP Percentile --      Pulse Rate 08/12/23 0753 (!) 101     Resp 08/12/23 0753 17     Temp 08/12/23 0753 98.6 F (37 C)     Temp Source 08/12/23 0753 Oral     SpO2 08/12/23 0753 100 %     Weight 08/12/23 0752 (!) 375 lb (170.1 kg)     Height 08/12/23 0752 6' 2 (1.88 m)     Head Circumference --      Peak Flow --      Pain Score 08/12/23 0752 1     Pain Loc --      Pain Education --      Exclude from Growth Chart --     Most recent vital signs: Vitals:   08/12/23 0753  BP: (!) 151/73  Pulse: (!) 101  Resp: 17  Temp: 98.6 F (37 C)  SpO2: 100%    Nursing Triage Note reviewed. Vital signs reviewed and patients oxygen saturation is normoxic General: Patient is well nourished, well developed, awake and alert, resting comfortably in no acute  distress Head: Normocephalic and atraumatic Eyes: Normal inspection, extraocular muscles intact, no conjunctival pallor Ear, nose, throat: Normal external exam No intraoral edema Neck: Normal range of motion Respiratory: Patient is in no respiratory distress, lungs CTAB Cardiovascular: Patient is not tachycardic, RRR without murmur appreciated GI: Abd SNT with no guarding or rebound  Back: Normal inspection of the back with good strength and range of motion throughout all ext Extremities: pulses intact with good cap refills, no LE pitting edema or calf tenderness There is a small mark over the palmar surface of patient's left hand, there is minimal erythema and swelling around it Neuro: The patient is alert and oriented to person, place, and time, appropriately conversive, with 5/5 bilat UE/LE strength, no gross motor or sensory defects noted. Coordination appears to be adequate. Skin: Warm, dry, and intact Psych: normal mood and affect, no SI or HI  ED Results / Procedures / Treatments   Labs (all labs ordered are listed, but only abnormal results are displayed) Labs Reviewed - No  data to display   EKG None  RADIOLOGY None    PROCEDURES:  Critical Care performed: No  Procedures   MEDICATIONS ORDERED IN ED: Medications  diphenhydrAMINE  (BENADRYL ) capsule 50 mg (50 mg Oral Given 08/12/23 0900)  famotidine  (PEPCID ) tablet 40 mg (40 mg Oral Given 08/12/23 0900)     IMPRESSION / MDM / ASSESSMENT AND PLAN / ED COURSE                                Differential diagnosis includes, but is not limited to, bee sting, allergic reaction, possible development of anaphylaxis   ED course: Patient is well-appearing and in no distress.  He does have mild swelling in the hand around the puncture joint but his symptoms presentation is certainly not consistent with anaphylaxis at this time.  Unfortunately he is allergic to prednisone  and consequently I have given him 50 mg of Benadryl   and 40 mg of Pepcid .  I do not think his reaction thus far requires epinephrine  but will have a low threshold to administer all should he worsen.  Will observe the patient for approximately 3 hours after the incident.  If he remains stable we will plan on discharge of continued Benadryl  as an outpatient   Clinical Course as of 08/12/23 1123  Tue Aug 12, 2023  1032 Patient reassessed since states improvement in symptoms.  He has access to EpiPen 's at home and Benadryl .  He feels comfortable returning home.  He was advised to take over-the-counter Benadryl  every 8 hours for the next 24 hours.  He would not drive or operate machinery.  All questions answered patient voiced understanding and requested discharge [HD]    Clinical Course User Index [HD] Nicholaus Rolland BRAVO, MD     FINAL CLINICAL IMPRESSION(S) / ED DIAGNOSES   Final diagnoses:  Bee sting, accidental or unintentional, initial encounter  Hand pain, left     Rx / DC Orders   ED Discharge Orders     None        Note:  This document was prepared using Dragon voice recognition software and may include unintentional dictation errors.   Nicholaus Rolland BRAVO, MD 08/12/23 [phone]

## 2023-08-12 NOTE — Progress Notes (Signed)
   Chief Complaint  Patient presents with   Ankle Pain    Pt is here due to left ankle pain, he states the pain began a few weeks ago, no injury to the ankle, he was seen in the ED on 7/4 were x-rays were done was told he does not have a fracture or sprain, he states to still have pain in the ankle.    Subjective:  43 y.o. male presenting today for new complaint of pain and tenderness associated to the left ankle.  Onset about 2 weeks ago.  Idiopathic onset.  Denies a history of injury.  He states that he literally woke up 1 morning with left ankle pain.  Went to the emergency department.  X-rays taken.  Referred here for follow-up  Past Medical History:  Diagnosis Date   Bulging lumbar disc    Diabetes mellitus without complication (HCC)    GERD (gastroesophageal reflux disease)    Gout    Hypertension    Sleep apnea, obstructive    sees Dr. Norleen Notice     Past Surgical History:  Procedure Laterality Date   NOSE SURGERY  2009   straighten nasal passage    TOE AMPUTATION  2001   right great toe    Allergies  Allergen Reactions   Bee Venom Anaphylaxis    Has epi pen   Dihydrocodeine Diarrhea   Hydrocodone-Acetaminophen  Diarrhea   Codeine  Diarrhea   Hydrocodone-Acetaminophen  Diarrhea   Prednisone  Diarrhea    Objective / Physical Exam:  General:  The patient is alert and oriented x3 in no acute distress. Dermatology:  Skin is warm, dry and supple bilateral lower extremities. Negative for open lesions or macerations. Vascular:  Palpable pedal pulses bilaterally.  No erythema. Neurological:  Grossly intact via light touch Musculoskeletal Exam:  Tenderness to palpation noted to the anterior lateral aspect of the left ankle mild edema noted. Range of motion within normal limits to all pedal and ankle joints bilateral. Muscle strength 5/5 in all groups bilateral.   DG Ankle Complete Left 07/18/2023 IMPRESSION: 1. No acute bony findings. 2. Apparent small chronic fragment  adjacent to the lateral malleolus may be old avulsion injury. 3. MRI may prove helpful to further evaluate as clinically warranted.    Assessment: 1.  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 X-Rays reviewed.  -Injection of 0.5 mL Celestone  Soluspan injected in the patient's left ankle anterolateral aspect. -Patient has a prescription for meloxicam  15 mg daily at home.  Take as prescribed -Compression ankle sleeve dispensed.  Wear daily -RICE -Return to clinic PRN  Thresa EMERSON Sar, DPM [facility]  Dr. Thresa EMERSON Sar, DPM    [address] [place], [place] [address]                Office [phone]  Fax [phone]

## 2023-09-01 ENCOUNTER — Other Ambulatory Visit: Payer: Self-pay

## 2023-09-01 ENCOUNTER — Ambulatory Visit: Admission: EM | Admit: 2023-09-01 | Discharge: 2023-09-01 | Disposition: A | Discharge status: Home / Self Care

## 2023-09-01 DIAGNOSIS — M25512 Pain in left shoulder: Secondary | ICD-10-CM | POA: Diagnosis not present

## 2023-09-01 MED ORDER — KETOROLAC TROMETHAMINE 30 MG/ML IJ SOLN
30.0000 mg | Freq: Once | INTRAMUSCULAR | Status: AC
  Administered 2023-09-01: 30 mg via INTRAMUSCULAR

## 2023-09-01 NOTE — ED Triage Notes (Signed)
 Pt is here with left arm pain for 2 weeks that has radiates up to shoulder, pt has taken OTC meds to relieve discomfort.

## 2023-09-01 NOTE — Discharge Instructions (Addendum)
 You are given an injection of Toradol  today.  Do not take any over-the-counter medications for the rest of the day.    Follow-up with your orthopedist.

## 2023-09-01 NOTE — ED Provider Notes (Signed)
 Danny Bauer    CSN: [id number] Arrival date & time: 09/01/23  1109      History   Chief Complaint Chief Complaint  Patient presents with   Arm Pain    HPI Danny Bauer is a 43 y.o. male.  Patient presents with left upper arm and shoulder pain x 2 weeks.  No falls or injury.  No OTC medication taken today.  No numbness, weakness, bruising, redness, wounds, chest pain, shortness of breath, fever.  Patient reports he needs a note for work today.  The history is provided by the patient and medical records.    Past Medical History:  Diagnosis Date   Bulging lumbar disc    Diabetes mellitus without complication (HCC)    GERD (gastroesophageal reflux disease)    Gout    Hypertension    Sleep apnea, obstructive    sees Dr. Norleen Notice     Patient Active Problem List   Diagnosis Date Noted   Allergy to bee sting 03/24/2019   Encounter to establish care 02/25/2019   Right knee pain 02/25/2019   Type 2 diabetes mellitus without complication, without long-term current use of insulin (HCC) 05/27/2016   Gout 05/30/2014   MORBID OBESITY 12/17/2007   Sleep apnea 12/17/2007   CARPAL TUNNEL SYNDROME 07/01/2007   Essential hypertension 10/07/2006   ASTHMA 08/27/2006   GERD 08/27/2006    Past Surgical History:  Procedure Laterality Date   NOSE SURGERY  2009   straighten nasal passage    TOE AMPUTATION  2001   right great toe       Home Medications    Prior to Admission medications   Medication Sig Start Date End Date Taking? Authorizing Provider  OZEMPIC, 0.25 OR 0.5 MG/DOSE, 2 MG/3ML SOPN  08/29/23  Yes [provider]  blood glucose meter kit and supplies KIT Dispense based on patient and insurance preference. Use up to four times daily as directed. (FOR ICD-9 250.00, 250.01). 02/25/19   Iloabachie, Chioma E, NP  cetirizine (ZYRTEC) 10 MG tablet Take 10 mg by mouth daily. 04/10/23   [provider]  EPINEPHrine  (EPIPEN  2-PAK) 0.3 mg/0.3 mL IJ  SOAJ injection Inject 0.3 mg into the muscle as needed for anaphylaxis. 10/19/21   Bradler, Evan K, MD  fluticasone (FLONASE) 50 MCG/ACT nasal spray Place into both nostrils. 02/13/23   [provider]  furosemide  (LASIX ) 20 MG tablet Take 2 tablets (40 mg total) by mouth daily. 04/16/23 08/12/23  Darliss Rogue, MD  gabapentin  (NEURONTIN ) 300 MG capsule 1 po q hs x 7 days, then increased to 1 po q lunch and 1 po q hs. 06/23/23   Hilma Hastings, PA-C  glipiZIDE  (GLUCOTROL ) 10 MG tablet Take 10 mg by mouth 2 (two) times daily. 12/02/22   [provider]  glucose blood (ONETOUCH VERIO) test strip 1 each by Other route daily. And lancets 1/day 03/05/18   Kassie Mallick, MD  JANUVIA  50 MG tablet Take 50 mg by mouth daily. 02/28/23   [provider]  JARDIANCE 25 MG TABS tablet Take 25 mg by mouth daily.    [provider]  losartan  (COZAAR ) 100 MG tablet Take 1 tablet (100 mg total) by mouth daily. 04/16/23 08/12/23  Darliss Rogue, MD  meloxicam  (MOBIC ) 15 MG tablet Take 15 mg by mouth daily.    [provider]  metFORMIN  (GLUCOPHAGE ) 1000 MG tablet Take 1 tablet (1,000 mg total) by mouth 2 (two) times daily with a meal. 02/25/19  Iloabachie, Chioma E, NP  omeprazole  (PRILOSEC) 20 MG capsule Take 1 capsule (20 mg total) by mouth every morning. 06/15/19   Iloabachie, Chioma E, NP  orphenadrine (NORFLEX) 100 MG tablet Take 100 mg by mouth 2 (two) times daily as needed. 04/16/23   [provider]  rosuvastatin (CRESTOR) 20 MG tablet Take 20 mg by mouth at bedtime.    [provider]  tadalafil  (CIALIS ) 20 MG tablet Take one tablet at least 60 minutes prior to sexual activity. 04/24/23   Vaillancourt, Samantha, PA-C  triamcinolone ointment (KENALOG) 0.1 % Apply 1 Application topically 2 (two) times daily. 02/13/23   [provider]    Family History Family History  Problem Relation Age of Onset   Heart failure Mother    Heart disease Mother     Diabetes Mother    Heart attack Father    Heart disease Father     Social History Social History   Tobacco Use   Smoking status: Never   Smokeless tobacco: Never  Vaping Use   Vaping status: Never Used  Substance Use Topics   Alcohol use: No   Drug use: No     Allergies   Bee venom, Dihydrocodeine, Hydrocodone-acetaminophen , Codeine , Hydrocodone-acetaminophen , and Prednisone    Review of Systems Review of Systems  Constitutional:  Negative for chills and fever.  Respiratory:  Negative for cough and shortness of breath.   Cardiovascular:  Negative for chest pain and palpitations.  Musculoskeletal:  Positive for arthralgias. Negative for joint swelling.  Skin:  Negative for color change, rash and wound.  Neurological:  Negative for weakness and numbness.     Physical Exam Triage Vital Signs ED Triage Vitals  Encounter Vitals Group     BP 09/01/23 1147 131/83     Girls Systolic BP Percentile --      Girls Diastolic BP Percentile --      Boys Systolic BP Percentile --      Boys Diastolic BP Percentile --      Pulse Rate 09/01/23 1147 87     Resp 09/01/23 1147 19     Temp 09/01/23 1147 98.3 F (36.8 C)     Temp Source 09/01/23 1147 Oral     SpO2 09/01/23 1147 97 %     Weight --      Height --      Head Circumference --      Peak Flow --      Pain Score 09/01/23 1148 2     Pain Loc --      Pain Education --      Exclude from Growth Chart --    No data found.  Updated Vital Signs BP 131/83 (BP Location: Left Arm)   Pulse 87   Temp 98.3 F (36.8 C) (Oral)   Resp 19   SpO2 97%   Visual Acuity Right Eye Distance:   Left Eye Distance:   Bilateral Distance:    Right Eye Near:   Left Eye Near:    Bilateral Near:     Physical Exam Constitutional:      General: He is not in acute distress.    Appearance: He is obese.  HENT:     Mouth/Throat:     Mouth: Mucous membranes are moist.  Cardiovascular:     Rate and Rhythm: Normal rate and regular rhythm.   Pulmonary:     Effort: Pulmonary effort is normal. No respiratory distress.  Musculoskeletal:  General: No swelling, tenderness or deformity.     Comments: LUE: Shoulder ROM limited. Strong hand grip, 2+ radial pulse, sensation intact, strength 5/5.   Skin:    Capillary Refill: Capillary refill takes less than 2 seconds.     Findings: No bruising, erythema, lesion or rash.  Neurological:     General: No focal deficit present.     Mental Status: He is alert.     Sensory: No sensory deficit.     Motor: No weakness.      UC Treatments / Results  Labs (all labs ordered are listed, but only abnormal results are displayed) Labs Reviewed - No data to display  EKG   Radiology No results found.  Procedures Procedures (including critical care time)  Medications Ordered in UC Medications  ketorolac  (TORADOL ) 30 MG/ML injection 30 mg (30 mg Intramuscular Given 09/01/23 1228)    Initial Impression / Assessment and Plan / UC Course  I have reviewed the triage vital signs and the nursing notes.  Pertinent labs & imaging results that were available during my care of the patient were reviewed by me and considered in my medical decision making (see chart for details).    Left shoulder pain.  Patient reports he is an established patient at [facility] orthopedics.  He has not taken any over-the-counter medication today.  Toradol  given here.  Instructed patient to avoid OTC medication today.  Instructed him to follow-up with his orthopedist.  Education provided on shoulder pain.  Work note provided per patient request.  He agrees to plan of care.  Final Clinical Impressions(s) / UC Diagnoses   Final diagnoses:  Acute pain of left shoulder     Discharge Instructions      You are given an injection of Toradol  today.  Do not take any over-the-counter medications for the rest of the day.    Follow-up with your orthopedist.     ED Prescriptions   None    I have reviewed  the PDMP during this encounter.   Corlis Burnard DEL, NP 09/01/23 [phone]

## 2023-09-23 ENCOUNTER — Encounter: Payer: Self-pay | Admitting: Podiatry

## 2023-09-23 ENCOUNTER — Ambulatory Visit (INDEPENDENT_AMBULATORY_CARE_PROVIDER_SITE_OTHER)

## 2023-09-23 ENCOUNTER — Ambulatory Visit (INDEPENDENT_AMBULATORY_CARE_PROVIDER_SITE_OTHER): Admitting: Podiatry

## 2023-09-23 ENCOUNTER — Ambulatory Visit: Admitting: Orthopedic Surgery

## 2023-09-23 VITALS — Ht 74.0 in | Wt 375.0 lb

## 2023-09-23 DIAGNOSIS — M7752 Other enthesopathy of left foot: Secondary | ICD-10-CM

## 2023-09-23 DIAGNOSIS — M7751 Other enthesopathy of right foot: Secondary | ICD-10-CM

## 2023-09-23 DIAGNOSIS — M7672 Peroneal tendinitis, left leg: Secondary | ICD-10-CM | POA: Diagnosis not present

## 2023-09-23 MED ORDER — METHYLPREDNISOLONE 4 MG PO TBPK: ORAL_TABLET | ORAL | 0 refills | Status: DC

## 2023-09-23 MED ORDER — BETAMETHASONE SOD PHOS & ACET 6 (3-3) MG/ML IJ SUSP
3.0000 mg | Freq: Once | INTRAMUSCULAR | Status: AC
  Administered 2023-09-23: 3 mg via INTRA_ARTICULAR

## 2023-09-23 NOTE — Progress Notes (Signed)
   Chief Complaint  Patient presents with   Foot Pain    Pt is here due to left foot pain states the foot has been bothering him since his last visit, the pain is on and off, states when he sits for long periods of time and stand back up that hurts him the most.    Subjective:  43 y.o. male presenting today new complaint of pain and tenderness associated to the lateral aspect of the left foot.  He says that his ankle is feeling significantly better.  He no longer has any pain to the ankle but he is experiencing significant pain to the lateral column of the left foot.  No history of injury.  Past Medical History:  Diagnosis Date   Bulging lumbar disc    Diabetes mellitus without complication (HCC)    GERD (gastroesophageal reflux disease)    Gout    Hypertension    Sleep apnea, obstructive    sees Dr. Norleen Notice     Past Surgical History:  Procedure Laterality Date   NOSE SURGERY  2009   straighten nasal passage    TOE AMPUTATION  2001   right great toe    Allergies  Allergen Reactions   Bee Venom Anaphylaxis    Has epi pen   Dihydrocodeine Diarrhea   Hydrocodone-Acetaminophen  Diarrhea   Codeine  Diarrhea   Hydrocodone-Acetaminophen  Diarrhea   Prednisone  Diarrhea    Objective / Physical Exam:  General:  The patient is alert and oriented x3 in no acute distress. Dermatology:  Skin is warm, dry and supple bilateral lower extremities. Negative for open lesions or macerations. Vascular:  Palpable pedal pulses bilaterally.  No erythema. Neurological:  Grossly intact via light touch Musculoskeletal Exam:  Pain with palpation around the fifth metatarsal tubercle of the left foot  DG Ankle Complete Left 07/18/2023 IMPRESSION: 1. No acute bony findings. 2. Apparent small chronic fragment adjacent to the lateral malleolus may be old avulsion injury. 3. MRI may prove helpful to further evaluate as clinically warranted.   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  No acute fracture identified.  Metatarsus adductus also noted  Assessment: 1.  Insertional peroneal tendinitis left  Plan of Care:  -Patient was evaluated. X-Rays reviewed.  -Injection of 0.5 mL Celestone  Soluspan injected around the fifth metatarsal tubercle of the left foot -Patient has a prescription for meloxicam  15 mg daily at home.  Take as prescribed -Prescription for Medrol  Dosepak -OTC power step insoles were dispensed.  Wear daily.  Advised against going barefoot -Return to clinic PRN  *Bus driver for [employer]  Thresa EMERSON Sar, DPM [facility]  Dr. Thresa EMERSON Sar, DPM    [address] [place], [place] [address]                Office [phone]  Fax [phone]

## 2023-09-25 ENCOUNTER — Other Ambulatory Visit: Payer: Self-pay | Admitting: Orthopedic Surgery

## 2023-09-25 DIAGNOSIS — M5416 Radiculopathy, lumbar region: Secondary | ICD-10-CM

## 2023-09-25 DIAGNOSIS — M47816 Spondylosis without myelopathy or radiculopathy, lumbar region: Secondary | ICD-10-CM

## 2023-09-25 DIAGNOSIS — M51362 Other intervertebral disc degeneration, lumbar region with discogenic back pain and lower extremity pain: Secondary | ICD-10-CM

## 2023-10-14 NOTE — Progress Notes (Deleted)
 Referring Physician:  [facility] [address] [place],  [place] [address]  Primary Physician:  Patient, No Pcp Per  History of Present Illness: 10/14/2023 Mr. Arsh Feutz has a history of heart murmur, HTN, hyperlipidemia, asthma, OSA, GERD, DM, obesity, gout, OSA  Last seen by me on 08/04/23 for intermittent LBP with known right foraminal disc L3-L4 with mild bilateral foraminal stenosis with left right foraminal disc L4-L5 with mild/moderate bilateral foraminal stenosis.   He was to continue PT- he was discharged on 09/25/23 after 9 visits. He continues on neurontin .   He is here for follow up.         He was started on neurontin . Dr. Claudene sent him a message regarding his MRI and recommended PT. Initial evaluation at [facility] was on 07/28/23. Has second visit today.   He is here for follow up.   He is doing better. He continues with intermittent LBP that is less frequent. His bilateral leg pain is gone. He has pain in his knees- had improvement with injection in left, still has some right knee pain. He is able to stand and walk with no pain. No numbness, tingling, or weakness in his legs.  He feels like neurotin has helped. He is still on mobic .   Last HgbA1c on 11/20/22 was 7.5.   Bowel/Bladder Dysfunction: none  Conservative measures:  Physical therapy: initial evaluation at [facility] on 07/28/23, did 9 visits and was discharged on 09/25/23 Multimodal medical therapy including regular antiinflammatories: Flexeril , Celebrex, Medrol  Dospak, norflex Injections: no epidural steroid injections  Past Surgery: no  spinal surgeries  [name] has no symptoms of cervical myelopathy.  The symptoms are causing a significant impact on the patient's life.   Review of Systems:  A 10 point review of systems is negative, except for the pertinent positives and negatives detailed in the HPI.  Past Medical History: Past Medical History:   Diagnosis Date   Bulging lumbar disc    Diabetes mellitus without complication (HCC)    GERD (gastroesophageal reflux disease)    Gout    Hypertension    Sleep apnea, obstructive    sees Dr. Norleen Notice     Past Surgical History: Past Surgical History:  Procedure Laterality Date   NOSE SURGERY  2009   straighten nasal passage    TOE AMPUTATION  2001   right great toe    Allergies: Allergies as of 10/15/2023 - Review Complete 09/23/2023  Allergen Reaction Noted   Bee venom Anaphylaxis 12/13/2012   Dihydrocodeine Diarrhea 07/23/2022   Hydrocodone-acetaminophen  Diarrhea 04/24/2023   Codeine  Diarrhea 11/29/2010   Hydrocodone-acetaminophen  Diarrhea 02/26/2006   Prednisone  Diarrhea 02/26/2006    Medications: Outpatient Encounter Medications as of 10/15/2023  Medication Sig   blood glucose meter kit and supplies KIT Dispense based on patient and insurance preference. Use up to four times daily as directed. (FOR ICD-9 250.00, 250.01).   cetirizine (ZYRTEC) 10 MG tablet Take 10 mg by mouth daily.   EPINEPHrine  (EPIPEN  2-PAK) 0.3 mg/0.3 mL IJ SOAJ injection Inject 0.3 mg into the muscle as needed for anaphylaxis.   fluticasone (FLONASE) 50 MCG/ACT nasal spray Place into both nostrils.   furosemide  (LASIX ) 20 MG tablet Take 2 tablets (40 mg total) by mouth daily.   gabapentin  (NEURONTIN ) 300 MG capsule TAKE 1 CAPSULE EVERY NIGHT FOR 7 DAYS , THEN INCREASE TO 1 CAPSULE AT LUNCH AND 1 AT BEDTIME.   glipiZIDE  (GLUCOTROL ) 10 MG tablet Take 10 mg by  mouth 2 (two) times daily.   glucose blood (ONETOUCH VERIO) test strip 1 each by Other route daily. And lancets 1/day   JANUVIA  50 MG tablet Take 50 mg by mouth daily.   JARDIANCE 25 MG TABS tablet Take 25 mg by mouth daily.   losartan  (COZAAR ) 100 MG tablet Take 1 tablet (100 mg total) by mouth daily.   meloxicam  (MOBIC ) 15 MG tablet Take 15 mg by mouth daily.   metFORMIN  (GLUCOPHAGE ) 1000 MG tablet Take 1 tablet (1,000 mg total) by mouth 2  (two) times daily with a meal.   methylPREDNISolone  (MEDROL  DOSEPAK) 4 MG TBPK tablet 6 day dose pack - take as directed   omeprazole  (PRILOSEC) 20 MG capsule Take 1 capsule (20 mg total) by mouth every morning.   orphenadrine (NORFLEX) 100 MG tablet Take 100 mg by mouth 2 (two) times daily as needed.   OZEMPIC, 0.25 OR 0.5 MG/DOSE, 2 MG/3ML SOPN    rosuvastatin (CRESTOR) 20 MG tablet Take 20 mg by mouth at bedtime.   tadalafil  (CIALIS ) 20 MG tablet Take one tablet at least 60 minutes prior to sexual activity.   triamcinolone ointment (KENALOG) 0.1 % Apply 1 Application topically 2 (two) times daily.   No facility-administered encounter medications on file as of 10/15/2023.    Social History: Social History   Tobacco Use   Smoking status: Never   Smokeless tobacco: Never  Vaping Use   Vaping status: Never Used  Substance Use Topics   Alcohol use: No   Drug use: No    Family Medical History: Family History  Problem Relation Age of Onset   Heart failure Mother    Heart disease Mother    Diabetes Mother    Heart attack Father    Heart disease Father     Physical Examination: There were no vitals filed for this visit.     Awake, alert, oriented to person, place, and time.  Speech is clear and fluent. Fund of knowledge is appropriate.   Cranial Nerves: Pupils equal round and reactive to light.  Facial tone is symmetric.    No abnormal lesions on exposed skin.   Strength: Side Iliopsoas Quads Hamstring PF DF EHL  R 5 5 5 5 5 5   L 5 5 5 5 5 5    Clonus is not present.   Bilateral lower extremity sensation is intact to light touch.     He has no pain with IR/ER of both hips, but has limited ROM.   He has normal gait.   Medical Decision Making  Imaging: None  Assessment and Plan: Mr. Mcaffee continues with intermittent LBP that is less frequent. His bilateral leg pain is gone. He has pain in his knees- had improvement with injection in left, still has some right knee  pain. He is able to stand and walk with no pain. No numbness, tingling, or weakness in his legs.  He has known right foraminal disc L3-L4 with mild bilateral foraminal stenosis with left right foraminal disc L4-L5 with mild/moderate bilateral foraminal stenosis.   Treatment options discussed with patient and following plan made:   - Continue with PT for lumbar spine at [facility].  - Continue on neurontin  as prescribed.  - Discussed lumbar injections. Will hold off since pain is better. Of note, he has allergy to po prednisone  (diarrhea). He's not had injections in the past.  - Follow up with me in 6-8 weeks and prn.   BP was slightly elevated. No symptoms of  chest pain, shortness of breath, blurry vision, or headaches.      I spent a total of 20 minutes in face-to-face and non-face-to-face activities related to this patient's care today including review of outside records, review of imaging, review of symptoms, physical exam, discussion of differential diagnosis, discussion of treatment options, and documentation.   Glade Boys PA-C Dept. of Neurosurgery

## 2023-10-15 ENCOUNTER — Ambulatory Visit: Admitting: Orthopedic Surgery

## 2023-10-21 ENCOUNTER — Ambulatory Visit (INDEPENDENT_AMBULATORY_CARE_PROVIDER_SITE_OTHER): Admitting: Physician Assistant

## 2023-10-21 VITALS — BP 147/79 | HR 91 | Ht 74.0 in | Wt 370.0 lb

## 2023-10-21 DIAGNOSIS — L291 Pruritus scroti: Secondary | ICD-10-CM

## 2023-10-21 MED ORDER — DIPHENHYDRAMINE HCL 2 % EX CREA: TOPICAL_CREAM | Freq: Three times a day (TID) | CUTANEOUS | 0 refills | Status: AC | PRN

## 2023-10-21 NOTE — Progress Notes (Unsigned)
 10/21/2023 3:17 PM   Danny Bauer [name] 1980-07-06 [id number]  CC: Chief Complaint  Patient presents with   Testicle Pain   HPI: Danny Bauer is a 43 y.o. male with PMH DM2 previously on Jardiance, HTN, OSA on CPAP, ED on demand dose tadalafil , and balanoposthitis who presents today for follow-up of scrotal itching.   I saw him in clinic most recently on 06/23/2023 for evaluation of scrotal itching.  He was on Jardiance at the time and Diflucan  had had some mild improvement.  I treated him with 2 doses of Diflucan  spaced 72 hours apart.  Today he reports he stopped Diflucan  about 2 months ago.  His scrotal itching never resolved despite the Diflucan .  He uses a sensitive skin body wash and regular Tide detergent.  PMH: Past Medical History:  Diagnosis Date   Bulging lumbar disc    Diabetes mellitus without complication (HCC)    GERD (gastroesophageal reflux disease)    Gout    Hypertension    Sleep apnea, obstructive    sees Dr. Norleen Notice     Surgical History: Past Surgical History:  Procedure Laterality Date   NOSE SURGERY  2009   straighten nasal passage    TOE AMPUTATION  2001   right great toe    Home Medications:  Allergies as of 10/21/2023       Reactions   Bee Venom Anaphylaxis   Has epi pen   Dihydrocodeine Diarrhea   Hydrocodone-acetaminophen  Diarrhea   Codeine  Diarrhea   Hydrocodone-acetaminophen  Diarrhea   Prednisone  Diarrhea        Medication List        Accurate as of October 21, 2023  3:17 PM. If you have any questions, ask your nurse or doctor.          STOP taking these medications    Januvia  50 MG tablet Generic drug: sitaGLIPtin    Jardiance 25 MG Tabs tablet Generic drug: empagliflozin   methylPREDNISolone  4 MG Tbpk tablet Commonly known as: MEDROL  DOSEPAK       TAKE these medications    blood glucose meter kit and supplies Kit Dispense based on patient and insurance preference. Use up to four times daily as  directed. (FOR ICD-9 250.00, 250.01).   cetirizine 10 MG tablet Commonly known as: ZYRTEC Take 10 mg by mouth daily.   EPINEPHrine  0.3 mg/0.3 mL Soaj injection Commonly known as: EpiPen  2-Pak Inject 0.3 mg into the muscle as needed for anaphylaxis.   fluticasone 50 MCG/ACT nasal spray Commonly known as: FLONASE Place into both nostrils.   furosemide  20 MG tablet Commonly known as: LASIX  Take 2 tablets (40 mg total) by mouth daily.   gabapentin  300 MG capsule Commonly known as: NEURONTIN  TAKE 1 CAPSULE EVERY NIGHT FOR 7 DAYS , THEN INCREASE TO 1 CAPSULE AT LUNCH AND 1 AT BEDTIME.   glipiZIDE  10 MG tablet Commonly known as: GLUCOTROL  Take 10 mg by mouth 2 (two) times daily.   glucose blood test strip Commonly known as: OneTouch Verio 1 each by Other route daily. And lancets 1/day   losartan  100 MG tablet Commonly known as: COZAAR  Take 1 tablet (100 mg total) by mouth daily.   meloxicam  15 MG tablet Commonly known as: MOBIC  Take 15 mg by mouth daily.   metFORMIN  1000 MG tablet Commonly known as: GLUCOPHAGE  Take 1 tablet (1,000 mg total) by mouth 2 (two) times daily with a meal.   omeprazole  20 MG capsule Commonly known as: PRILOSEC Take 1  capsule (20 mg total) by mouth every morning.   orphenadrine 100 MG tablet Commonly known as: NORFLEX Take 100 mg by mouth 2 (two) times daily as needed.   Ozempic (0.25 or 0.5 MG/DOSE) 2 MG/3ML Sopn Generic drug: Semaglutide(0.25 or 0.5MG /DOS)   rosuvastatin 20 MG tablet Commonly known as: CRESTOR Take 20 mg by mouth at bedtime.   tadalafil  20 MG tablet Commonly known as: CIALIS  Take one tablet at least 60 minutes prior to sexual activity.   triamcinolone ointment 0.1 % Commonly known as: KENALOG Apply 1 Application topically 2 (two) times daily.        Allergies:  Allergies  Allergen Reactions   Bee Venom Anaphylaxis    Has epi pen   Dihydrocodeine Diarrhea   Hydrocodone-Acetaminophen  Diarrhea   Codeine   Diarrhea   Hydrocodone-Acetaminophen  Diarrhea   Prednisone  Diarrhea    Family History: Family History  Problem Relation Age of Onset   Heart failure Mother    Heart disease Mother    Diabetes Mother    Heart attack Father    Heart disease Father     Social History:   reports that he has never smoked. He has never used smokeless tobacco. He reports that he does not drink alcohol and does not use drugs.  Physical Exam: BP (!) 147/79 (BP Location: Left Arm, Patient Position: Sitting, Cuff Size: Large)   Pulse 91   Ht 6' 2 (1.88 m)   Wt (!) 370 lb (167.8 kg)   SpO2 95%   BMI 47.51 kg/m   Constitutional:  Alert and oriented, no acute distress, nontoxic appearing HEENT: [place], AT Cardiovascular: No clubbing, cyanosis, or edema Respiratory: Normal respiratory effort, no increased work of breathing GU: Bilateral descended testicles.  Scrotum is intact, mild diffuse erythema but no rashes, purulence, fluctuance, or crepitus. Skin: No rashes, bruises or suspicious lesions Neurologic: Grossly intact, no focal deficits, moving all 4 extremities Psychiatric: Normal mood and affect  Assessment & Plan:   1. Scrotal itching (Primary) Persistent scrotal itching despite stopping Jardiance.  He had multiple doses of Diflucan  with no resolution.  Unclear etiology.  I encouraged him to switch to an unscented detergent and try topical antihistamine cream.  If no improvement with this, I would recommend he follow-up with dermatology.  Return if symptoms worsen or fail to improve.  Lucie Hones, PA-C  [facility] [place] [address], [address] [place], [place] [address] [phone]

## 2023-10-22 ENCOUNTER — Other Ambulatory Visit: Payer: Self-pay

## 2023-10-22 ENCOUNTER — Emergency Department
Admission: EM | Admit: 2023-10-22 | Discharge: 2023-10-22 | Disposition: A | Discharge status: Home / Self Care | Attending: Emergency Medicine | Admitting: Emergency Medicine

## 2023-10-22 DIAGNOSIS — I10 Essential (primary) hypertension: Secondary | ICD-10-CM | POA: Diagnosis not present

## 2023-10-22 DIAGNOSIS — E119 Type 2 diabetes mellitus without complications: Secondary | ICD-10-CM | POA: Insufficient documentation

## 2023-10-22 DIAGNOSIS — M79651 Pain in right thigh: Secondary | ICD-10-CM | POA: Diagnosis present

## 2023-10-22 DIAGNOSIS — M79604 Pain in right leg: Secondary | ICD-10-CM | POA: Insufficient documentation

## 2023-10-22 MED ORDER — DIAZEPAM 5 MG PO TABS
10.0000 mg | ORAL_TABLET | Freq: Once | ORAL | Status: AC
  Administered 2023-10-22: 10 mg via ORAL
  Filled 2023-10-22: qty 2

## 2023-10-22 MED ORDER — KETOROLAC TROMETHAMINE 15 MG/ML IJ SOLN
15.0000 mg | Freq: Once | INTRAMUSCULAR | Status: AC
  Administered 2023-10-22: 15 mg via INTRAMUSCULAR
  Filled 2023-10-22: qty 1

## 2023-10-22 MED ORDER — NAPROXEN 500 MG PO TABS
500.0000 mg | ORAL_TABLET | Freq: Two times a day (BID) | ORAL | 0 refills | Status: AC
Start: 2023-10-22 — End: 2023-10-29

## 2023-10-22 MED ORDER — CYCLOBENZAPRINE HCL 10 MG PO TABS: 10.0000 mg | ORAL_TABLET | Freq: Three times a day (TID) | ORAL | 0 refills | Status: AC | PRN

## 2023-10-22 MED ORDER — LIDOCAINE 5 % EX PTCH
1.0000 | MEDICATED_PATCH | Freq: Once | CUTANEOUS | Status: DC
  Administered 2023-10-22: 1 via TRANSDERMAL
  Filled 2023-10-22: qty 1

## 2023-10-22 NOTE — Discharge Instructions (Addendum)
 You were seen in the ER for your leg pain.  I suspect you may have a strain of your muscle.  I have sent a prescription for an anti-inflammatory as well as a muscle relaxer to your pharmacy.  The muscle relaxer can make you drowsy, do not drive or operate machinery when taking this.  Follow-up a primary care doctor for further evaluation if your symptoms are not improving.  Return to the ER for new or worsening symptoms.

## 2023-10-22 NOTE — ED Notes (Signed)
 Patient states he took 4 Advil  PTA

## 2023-10-22 NOTE — ED Provider Notes (Signed)
 [facility] Provider Note    Event Date/Time   First MD Initiated Contact with Patient 10/22/23 1948     (approximate)   History   Leg Pain   HPI  Danny EARNSHAW is a 43 year old male with history of hypertension, diabetes presenting to the emergency department for evaluation of leg pain.  He reports that earlier today he had onset of pain over his right upper leg, worse with movements.  Denies any recent injury, trauma, heavy lifting.  Does report a history of bulging disc but denies any back pain.  No bowel or bladder symptoms.  No fevers.     Physical Exam   Triage Vital Signs: ED Triage Vitals  Encounter Vitals Group     BP 10/22/23 1837 (!) 149/70     Girls Systolic BP Percentile --      Girls Diastolic BP Percentile --      Boys Systolic BP Percentile --      Boys Diastolic BP Percentile --      Pulse Rate 10/22/23 1837 88     Resp 10/22/23 1837 (!) 22     Temp 10/22/23 1837 98.8 F (37.1 C)     Temp Source 10/22/23 1837 Oral     SpO2 10/22/23 1837 98 %     Weight --      Height --      Head Circumference --      Peak Flow --      Pain Score 10/22/23 1839 2     Pain Loc --      Pain Education --      Exclude from Growth Chart --     Most recent vital signs: Vitals:   10/22/23 1837  BP: (!) 149/70  Pulse: 88  Resp: (!) 22  Temp: 98.8 F (37.1 C)  SpO2: 98%     General: Awake, interactive  CV:  Good peripheral perfusion Resp:  Unlabored respirations Abd:  Nondistended.  Neuro:  Symmetric facial movement, fluid speech Other:   Freely ranging left lower extremity.  An estimate tenderness to palpation of the bilateral lower extremities.  Patient does have pain with attempts at range of motion over his hip, but no bony tenderness to palpation.  Intact distal pulses.   ED Results / Procedures / Treatments   Labs (all labs ordered are listed, but only abnormal results are displayed) Labs Reviewed - No data to  display   EKG EKG independently reviewed and interpreted by myself demonstrates:    RADIOLOGY Imaging independently reviewed and interpreted by myself demonstrates:   Formal Radiology Read:  No results found.  PROCEDURES:  Critical Care performed: No  Procedures   MEDICATIONS ORDERED IN ED: Medications  lidocaine  (LIDODERM ) 5 % 1 patch (1 patch Transdermal Patch Applied 10/22/23 2014)  diazepam (VALIUM) tablet 10 mg (10 mg Oral Given 10/22/23 2013)  ketorolac  (TORADOL ) 15 MG/ML injection 15 mg (15 mg Intramuscular Given 10/22/23 2014)     IMPRESSION / MDM / ASSESSMENT AND PLAN / ED COURSE  I reviewed the triage vital signs and the nursing notes.  Differential diagnosis includes, but is not limited to, musculoskeletal strain, consideration but lower suspicion for sciatica related to low back pain, very low suspicion for fracture based on clinical history  Patient's presentation is most consistent with acute, uncomplicated illness.  43 year old male presenting with atraumatic leg pain.  Neurovascularly intact.  Low suspicion fracture, do not think there is indication for imaging currently.  Will treat with multimodal pain control and reassess.  Patient reassessed and reports significant improvement in his pain.  He is comfortable with discharge home.  Will DC with prescription for Flexeril  and naproxen .  Strict return precautions provided.  Patient discharged in stable condition peer      FINAL CLINICAL IMPRESSION(S) / ED DIAGNOSES   Final diagnoses:  Right leg pain     Rx / DC Orders   ED Discharge Orders          Ordered    naproxen  (NAPROSYN ) 500 MG tablet  2 times daily with meals        10/22/23 2121    cyclobenzaprine  (FLEXERIL ) 10 MG tablet  3 times daily PRN        10/22/23 2121             Note:  This document was prepared using Dragon voice recognition software and may include unintentional dictation errors.   Levander Slate, MD 10/22/23 2121

## 2023-10-22 NOTE — ED Triage Notes (Signed)
 Patient states right upper leg pain, denies injury and/or trauma; pain just started tonight.

## 2023-10-30 ENCOUNTER — Ambulatory Visit: Admitting: Orthopedic Surgery

## 2023-11-06 DIAGNOSIS — N476 Balanoposthitis: Secondary | ICD-10-CM

## 2023-11-07 MED ORDER — FLUCONAZOLE 200 MG PO TABS: 800.0000 mg | ORAL_TABLET | Freq: Every day | ORAL | 0 refills | Status: AC

## 2023-11-10 ENCOUNTER — Ambulatory Visit: Admitting: Physician Assistant

## 2023-11-10 ENCOUNTER — Encounter: Payer: Self-pay | Admitting: Physician Assistant

## 2023-11-10 ENCOUNTER — Ambulatory Visit

## 2023-11-10 VITALS — BP 136/76 | Ht 74.0 in | Wt 359.2 lb

## 2023-11-10 DIAGNOSIS — M25551 Pain in right hip: Secondary | ICD-10-CM | POA: Diagnosis not present

## 2023-11-10 NOTE — Progress Notes (Signed)
 Referring Physician:  [facility], [facility] [address] [place],  [place] [address]  Primary Physician:  Patient, No Pcp Per  History of Present Illness: 11/10/2023 Mr. Burnell Hurta has a history of heart murmur, HTN, hyperlipidemia, asthma, OSA, GERD, DM, obesity, gout, OSA  Last seen by Glade Boys on 08/04/23 for intermittent LBP with known right foraminal disc L3-L4 with mild bilateral foraminal stenosis with left right foraminal disc L4-L5 with mild/moderate bilateral foraminal stenosis. He was to continue PT- he was discharged on 09/25/23 after 9 visits. He continues on neurontin .   He is here for follow up.  He continues to have low back pain, but his largest complaint today is bilateral leg pain, right worse than left primarily in his hip that radiates to his anterior thigh and stops at the level of his knee.  He states that he feels like an old man walking.  When sitting for prolonged period of time or when getting up from bed he has a significant amount of stiffness and feels as though it is hard to walk.  He has not yet undergone an injection for his hip or his back.      Last HgbA1c on 11/20/22 was 7.5.   Bowel/Bladder Dysfunction: none  Conservative measures:  Physical therapy: initial evaluation at [facility] on 07/28/23, did 9 visits and was discharged on 09/25/23 Multimodal medical therapy including regular antiinflammatories: Flexeril , Celebrex, Medrol  Dospak, norflex Injections: no epidural steroid injections  Past Surgery: no  spinal surgeries  [name] has no symptoms of cervical myelopathy.  The symptoms are causing a significant impact on the patient's life.   Review of Systems:  A 10 point review of systems is negative, except for the pertinent positives and negatives detailed in the HPI.  Past Medical History: Past Medical History:  Diagnosis Date   Bulging lumbar disc    Diabetes mellitus without complication (HCC)    GERD  (gastroesophageal reflux disease)    Gout    Hypertension    Sleep apnea, obstructive    sees Dr. Norleen Notice     Past Surgical History: Past Surgical History:  Procedure Laterality Date   NOSE SURGERY  2009   straighten nasal passage    TOE AMPUTATION  2001   right great toe    Allergies: Allergies as of 11/10/2023 - Review Complete 10/22/2023  Allergen Reaction Noted   Bee venom Anaphylaxis 12/13/2012   Dihydrocodeine Diarrhea 07/23/2022   Hydrocodone-acetaminophen  Diarrhea 04/24/2023   Codeine  Diarrhea 11/29/2010   Hydrocodone-acetaminophen  Diarrhea 02/26/2006   Prednisone  Diarrhea 02/26/2006    Medications: Outpatient Encounter Medications as of 11/10/2023  Medication Sig   blood glucose meter kit and supplies KIT Dispense based on patient and insurance preference. Use up to four times daily as directed. (FOR ICD-9 250.00, 250.01).   cetirizine (ZYRTEC) 10 MG tablet Take 10 mg by mouth daily.   cyclobenzaprine  (FLEXERIL ) 10 MG tablet Take 1 tablet (10 mg total) by mouth 3 (three) times daily as needed for muscle spasms.   diphenhydrAMINE  (BENADRYL ) 2 % cream Apply topically 3 (three) times daily as needed for itching.   EPINEPHrine  (EPIPEN  2-PAK) 0.3 mg/0.3 mL IJ SOAJ injection Inject 0.3 mg into the muscle as needed for anaphylaxis.   fluconazole  (DIFLUCAN ) 200 MG tablet Take 4 tablets (800 mg total) by mouth daily for 7 days.   fluticasone (FLONASE) 50 MCG/ACT nasal spray Place into both nostrils.   furosemide  (LASIX ) 20 MG tablet Take 2 tablets (40  mg total) by mouth daily.   gabapentin  (NEURONTIN ) 300 MG capsule TAKE 1 CAPSULE EVERY NIGHT FOR 7 DAYS , THEN INCREASE TO 1 CAPSULE AT LUNCH AND 1 AT BEDTIME.   glipiZIDE  (GLUCOTROL ) 10 MG tablet Take 10 mg by mouth 2 (two) times daily.   glucose blood (ONETOUCH VERIO) test strip 1 each by Other route daily. And lancets 1/day   losartan  (COZAAR ) 100 MG tablet Take 1 tablet (100 mg total) by mouth daily.   metFORMIN   (GLUCOPHAGE ) 1000 MG tablet Take 1 tablet (1,000 mg total) by mouth 2 (two) times daily with a meal.   omeprazole  (PRILOSEC) 20 MG capsule Take 1 capsule (20 mg total) by mouth every morning.   orphenadrine (NORFLEX) 100 MG tablet Take 100 mg by mouth 2 (two) times daily as needed.   OZEMPIC, 0.25 OR 0.5 MG/DOSE, 2 MG/3ML SOPN    rosuvastatin (CRESTOR) 20 MG tablet Take 20 mg by mouth at bedtime.   tadalafil  (CIALIS ) 20 MG tablet Take one tablet at least 60 minutes prior to sexual activity.   triamcinolone ointment (KENALOG) 0.1 % Apply 1 Application topically 2 (two) times daily.   No facility-administered encounter medications on file as of 11/10/2023.    Social History: Social History   Tobacco Use   Smoking status: Never   Smokeless tobacco: Never  Vaping Use   Vaping status: Never Used  Substance Use Topics   Alcohol use: No   Drug use: No    Family Medical History: Family History  Problem Relation Age of Onset   Heart failure Mother    Heart disease Mother    Diabetes Mother    Heart attack Father    Heart disease Father     Physical Examination: There were no vitals filed for this visit.     Awake, alert, oriented to person, place, and time.  Speech is clear and fluent. Fund of knowledge is appropriate.   Cranial Nerves: Pupils equal round and reactive to light.  Facial tone is symmetric.       Strength: Side Iliopsoas Quads Hamstring PF DF EHL  R 4 5 5 5 5 5   L 4+ 5 5 5 5 5    Significant pain with internal and external rotation of his right hip.  Clonus is not present.   Bilateral lower extremity sensation is intact to light touch.     Medical Decision Making  Imaging: XAM DESCRIPTION: MR LUMBAR SPINE WO CONTRAST   CLINICAL HISTORY: evaluate lumbar radiculopathy   COMPARISON: None Available.   TECHNIQUE: MRI of the lumbar spine is performed according to our usual protocol with axial and sagittal multi sequence imaging.   FINDINGS: The  alignment is unremarkable. Mild degenerative disc disease L3-4 and L4-5. No discogenic endplate marrow edema. The marrow signal is unremarkable as well as the conus and cauda equina.   L3-4 has a small distal right foraminal protrusion causing mild foraminal narrowing. There is no overall broad-based disc bulge combined with facet/ligament flavum hypertrophy to cause a mild central stenosis.   L4-5 has a distal right foraminal protrusion combined with facet hypertrophy to cause mild to moderate foraminal narrowing with possible contact of the exiting nerve. There is also mild degenerative left-sided foraminal and lateral recess narrowing.   L5-S1 has mild foraminal narrowing the left due to broad-based disc bulge and facet hypertrophy.   The imaged levels are otherwise unremarkable.   The imaged retroperitoneal structures are unremarkable.     IMPRESSION: Multilevel degenerative spondylosis  with levels described in detail above.   L4-5 has a distal right foraminal protrusion contributing to mild-to-moderate foraminal narrowing. Possible contact of the exiting L4 nerve.   L3-4 has a smaller distal right foraminal protrusion contributing to mild foraminal narrowing. Also a mild overall degenerative central stenosis.  Assessment and Plan: Mr. Osowski continues with intermittent LBP and persistent bilateral leg pain, right worse than left.  Describes pain primarily in his hip that radiates to his anterior thigh.  He does have some pain with hip flexion as well as internal and external rotation of his hip.  I am concerned that patient's primary source of pain is actually in his right hip.  Would like to obtain x-rays while in clinic today.  Plan for likely referral to orthopedic surgery based off imaging.  Still will consider lumbar injections in the future however would like him evaluated for his right hip first.   I spent a total of 20 minutes in face-to-face and non-face-to-face  activities related to this patient's care today including review of outside records, review of imaging, review of symptoms, physical exam, discussion of differential diagnosis, discussion of treatment options, and documentation.   Lyle Decamp, PA-C Dept. of Neurosurgery

## 2023-11-12 ENCOUNTER — Ambulatory Visit: Payer: Self-pay | Admitting: Physician Assistant

## 2023-11-17 ENCOUNTER — Other Ambulatory Visit: Payer: Self-pay | Admitting: Cardiology

## 2023-12-10 ENCOUNTER — Other Ambulatory Visit: Payer: Self-pay

## 2023-12-10 DIAGNOSIS — L291 Pruritus scroti: Secondary | ICD-10-CM

## 2023-12-10 NOTE — Progress Notes (Signed)
 Cardiology Clinic Note   Date: 12/16/2023 ID: Danny Bauer, DOB 27-Apr-1980, MRN [id number]  Primary Cardiologist:  Redell Cave, MD  Chief Complaint   Danny Bauer is a 43 y.o. male who presents to the clinic today for routine follow up.   Patient Profile   Danny Bauer is followed by Dr. Cave for the history outlined below.       Past medical history significant for: Lower extremity edema. Cardiac murmur. Echo 05/13/2023: EF 60 to 65%.  No RWMA.  Mild LVH.  Normal diastolic parameters. Normal RV size/function.  Mild LAE.  Mild MR.  Trivial AI. Hypertension. Hyperlipidemia. LDL 11/20/2022: LDL 37, HDL 26, TG 247, total 101. Asthma. OSA. CPAP with 100% adherence.  GERD. T2DM.  In summary, patient was first evaluated by Dr. Cave on 04/16/2023 for lower extremity edema.  Patient reported a 3 to 55-month history of lower extremity edema without chest pain or shortness of breath.  He was prescribed Lasix  with good effect.  He was out of Lasix  at the time of his visit.  He reported a family history of CAD with father with MI in his 81s and CHF.  Patient was instructed to stop Cardizem  and lisinopril /HCTZ and start losartan  and daily Lasix .  He had an echo which demonstrated normal LV/RV function.   Patient was last seen in the office by me on 06/16/2023 for follow-up after testing.  He reported continued lower extremity edema that progressed throughout the day.  He endorsed a high sodium diet with a lot of processed foods and sitting with legs dependent most of the day.  He was encouraged to increase physical activity and improve diet.  No medication changes were made.     History of Present Illness    Today, patient reports he is doing well. Patient denies shortness of breath, dyspnea on exertion, orthopnea or PND. He is compliant with CPAP. He reports chronic lower extremity edema that is at its best in the morning and progresses throughout the day. It has  improved since starting Lasix . 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 No chest pain, pressure, or tightness. No palpitations.  His activity is limited by severe hip pain. He is waiting to see the orthopedist. He is able to perform light to moderate household activities.     ROS: All other systems reviewed and are otherwise negative except as noted in History of Present Illness.  EKGs/Labs Reviewed    EKG Interpretation Date/Time:  Tuesday December 16 2023 13:27:31 EST Ventricular Rate:  83 PR Interval:  164 QRS Duration:  80 QT Interval:  392 QTC Calculation: 460 R Axis:   -6  Text Interpretation: Normal sinus rhythm When compared with ECG of 16-Apr-2023 09:47, No significant change was found Confirmed by Loistine Sober [id number]) on 12/16/2023 1:34:48 PM   12/18/2022: ALT 28; AST 40 06/16/2023: BUN 10; Creatinine, Ser 0.86; Potassium 3.7; Sodium 140   12/18/2022: Hemoglobin 11.1; WBC 4.8   Physical Exam    VS:  BP (!) 140/64 (BP Location: Left Arm, Patient Position: Sitting, Cuff Size: Large)   Pulse 83   Resp (!) 22   Ht 6' 2 (1.88 m)   Wt (!) 363 lb (164.7 kg)   SpO2 95%   BMI 46.61 kg/m  , BMI Body mass index is 46.61 kg/m.  GEN: Well nourished, well developed, in no acute distress. Neck: No JVD or carotid bruits.  Cardiac:  RRR. 2/6 systolic murmur. No rubs or gallops.   Respiratory:  Respirations regular and unlabored. Clear to auscultation without rales, wheezing or rhonchi. GI: Soft, nontender, nondistended. Extremities: Radials/DP/PT 2+ and equal bilaterally. No clubbing or cyanosis. Mild, nonpitting edema bilateral lower extremities.   Skin: Warm and dry, no rash. Neuro: Strength intact.  Assessment & Plan   Lower extremity edema Patient reports continued chronic lower extremity edema that is at its best in the morning and progresses throughout the day. 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 He does feel edema has improved since starting Lasix . He denies shortness of breath, orthopnea or PND. He is compliant with CPAP. Mild, nonpitting edema bilateral lower extremities.  - Encouraged use of compression socks.  - Continue Lasix .   Mitral regurgitation Echo April 2025 showed normal LV/RV function, mild LVH, normal diastolic parameters, mild LAE, mild MR, trivial AI.  Patient denies shortness of breath, lightheadedness, dizziness, presyncope or syncope. 2/6 systolic murmur on exam today. - Repeat echo as clinically indicated.   Hypertension BP today 140/64. No headaches or dizziness reported.  - Continue losartan .   Hyperlipidemia LDL 37 November 2024, at goal. Triglycerides elevated at 247. Discussed increasing physical activity.  - Continue rosuvastatin. - Increase physical activity as tolerated. - Will have labs with PCP.   Disposition: Return in 1 year or sooner as needed.          Signed, Barnie HERO. Niasha Devins, DNP, NP-C

## 2023-12-16 ENCOUNTER — Ambulatory Visit: Attending: Student | Admitting: Student

## 2023-12-16 ENCOUNTER — Encounter: Payer: Self-pay | Admitting: Student

## 2023-12-16 VITALS — BP 140/64 | HR 83 | Resp 22 | Ht 74.0 in | Wt 363.0 lb

## 2023-12-16 DIAGNOSIS — I340 Nonrheumatic mitral (valve) insufficiency: Secondary | ICD-10-CM | POA: Diagnosis not present

## 2023-12-16 DIAGNOSIS — E782 Mixed hyperlipidemia: Secondary | ICD-10-CM

## 2023-12-16 DIAGNOSIS — R600 Localized edema: Secondary | ICD-10-CM

## 2023-12-16 DIAGNOSIS — I10 Essential (primary) hypertension: Secondary | ICD-10-CM

## 2023-12-16 NOTE — Patient Instructions (Signed)
 Medication Instructions:   Your physician recommends that you continue on your current medications as directed. Please refer to the Current Medication list given to you today.    *If you need a refill on your cardiac medications before your next appointment, please call your pharmacy*  Lab Work:  None ordered at this time   If you have labs (blood work) drawn today and your tests are completely normal, you will receive your results only by:  MyChart Message (if you have MyChart) OR  A paper copy in the mail If you have any lab test that is abnormal or we need to change your treatment, we will call you to review the results.  Testing/Procedures:  None ordered at this time   Referrals:  None ordered at this time   Follow-Up:  At [facility], you and your health needs are our priority.  As part of our continuing mission to provide you with exceptional heart care, our providers are all part of one team.  This team includes your primary Cardiologist (physician) and Advanced Practice Providers or APPs (Physician Assistants and Nurse Practitioners) who all work together to provide you with the care you need, when you need it.  Your next appointment:   1 year(s)  Provider:    Redell Cave, MD or Barnie Hila, NP    We recommend signing up for the patient portal called MyChart.  Sign up information is provided on this After Visit Summary.  MyChart is used to connect with patients for Virtual Visits (Telemedicine).  Patients are able to view lab/test results, encounter notes, upcoming appointments, etc.  Non-urgent messages can be sent to your provider as well.   To learn more about what you can do with MyChart, go to [email or web address].
